# Patient Record
Sex: Female | Born: 1980 | Race: White | Hispanic: No | Marital: Married | Smoking: Former smoker
Health system: Southern US, Community
[De-identification: ages and names within clinical notes are randomized; demographics above are authoritative.]

## PROBLEM LIST (undated history)

## (undated) ENCOUNTER — Inpatient Hospital Stay (HOSPITAL_COMMUNITY): Payer: Self-pay

## (undated) DIAGNOSIS — W5911XA Bitten by nonvenomous snake, initial encounter: Secondary | ICD-10-CM

## (undated) DIAGNOSIS — F32A Depression, unspecified: Secondary | ICD-10-CM

## (undated) DIAGNOSIS — B009 Herpesviral infection, unspecified: Secondary | ICD-10-CM

## (undated) DIAGNOSIS — F329 Major depressive disorder, single episode, unspecified: Secondary | ICD-10-CM

## (undated) DIAGNOSIS — D759 Disease of blood and blood-forming organs, unspecified: Secondary | ICD-10-CM

## (undated) DIAGNOSIS — G43909 Migraine, unspecified, not intractable, without status migrainosus: Secondary | ICD-10-CM

## (undated) DIAGNOSIS — Z8701 Personal history of pneumonia (recurrent): Secondary | ICD-10-CM

## (undated) DIAGNOSIS — T8859XA Other complications of anesthesia, initial encounter: Secondary | ICD-10-CM

## (undated) DIAGNOSIS — F419 Anxiety disorder, unspecified: Secondary | ICD-10-CM

## (undated) DIAGNOSIS — Z86718 Personal history of other venous thrombosis and embolism: Secondary | ICD-10-CM

## (undated) DIAGNOSIS — Z9889 Other specified postprocedural states: Secondary | ICD-10-CM

## (undated) DIAGNOSIS — C801 Malignant (primary) neoplasm, unspecified: Secondary | ICD-10-CM

## (undated) DIAGNOSIS — R112 Nausea with vomiting, unspecified: Secondary | ICD-10-CM

## (undated) DIAGNOSIS — IMO0002 Reserved for concepts with insufficient information to code with codable children: Secondary | ICD-10-CM

## (undated) DIAGNOSIS — S0990XA Unspecified injury of head, initial encounter: Secondary | ICD-10-CM

## (undated) DIAGNOSIS — T4145XA Adverse effect of unspecified anesthetic, initial encounter: Secondary | ICD-10-CM

## (undated) HISTORY — PX: ABDOMINAL HYSTERECTOMY: SHX81

## (undated) HISTORY — DX: Anxiety disorder, unspecified: F41.9

## (undated) HISTORY — DX: Herpesviral infection, unspecified: B00.9

## (undated) HISTORY — PX: WISDOM TOOTH EXTRACTION: SHX21

## (undated) HISTORY — PX: PLACEMENT OF BREAST IMPLANTS: SHX6334

## (undated) HISTORY — DX: Reserved for concepts with insufficient information to code with codable children: IMO0002

## (undated) HISTORY — DX: Depression, unspecified: F32.A

## (undated) HISTORY — PX: ENDOMETRIAL ABLATION: SHX621

## (undated) HISTORY — DX: Major depressive disorder, single episode, unspecified: F32.9

---

## 1998-02-21 ENCOUNTER — Other Ambulatory Visit: Admission: RE | Admit: 1998-02-21 | Discharge: 1998-02-21 | Payer: Self-pay | Admitting: Obstetrics and Gynecology

## 1998-03-21 ENCOUNTER — Emergency Department (HOSPITAL_COMMUNITY): Admission: EM | Admit: 1998-03-21 | Discharge: 1998-03-21 | Payer: Self-pay | Admitting: Emergency Medicine

## 1998-07-29 ENCOUNTER — Inpatient Hospital Stay (HOSPITAL_COMMUNITY): Admission: AD | Admit: 1998-07-29 | Discharge: 1998-07-29 | Payer: Self-pay | Admitting: Obstetrics and Gynecology

## 1998-08-01 ENCOUNTER — Inpatient Hospital Stay (HOSPITAL_COMMUNITY): Admission: AD | Admit: 1998-08-01 | Discharge: 1998-08-03 | Payer: Self-pay | Admitting: Obstetrics & Gynecology

## 1998-09-24 ENCOUNTER — Inpatient Hospital Stay (HOSPITAL_COMMUNITY): Admission: AD | Admit: 1998-09-24 | Discharge: 1998-09-27 | Payer: Self-pay | Admitting: Obstetrics & Gynecology

## 1999-08-30 ENCOUNTER — Encounter: Payer: Self-pay | Admitting: Emergency Medicine

## 1999-08-30 ENCOUNTER — Emergency Department (HOSPITAL_COMMUNITY): Admission: EM | Admit: 1999-08-30 | Discharge: 1999-08-30 | Payer: Self-pay | Admitting: Emergency Medicine

## 1999-08-31 ENCOUNTER — Emergency Department (HOSPITAL_COMMUNITY): Admission: EM | Admit: 1999-08-31 | Discharge: 1999-08-31 | Payer: Self-pay | Admitting: Emergency Medicine

## 1999-09-01 ENCOUNTER — Encounter: Payer: Self-pay | Admitting: Emergency Medicine

## 1999-11-11 ENCOUNTER — Emergency Department (HOSPITAL_COMMUNITY): Admission: EM | Admit: 1999-11-11 | Discharge: 1999-11-12 | Payer: Self-pay | Admitting: Emergency Medicine

## 2000-08-18 ENCOUNTER — Emergency Department (HOSPITAL_COMMUNITY): Admission: EM | Admit: 2000-08-18 | Discharge: 2000-08-18 | Payer: Self-pay | Admitting: Emergency Medicine

## 2001-11-08 ENCOUNTER — Other Ambulatory Visit: Admission: RE | Admit: 2001-11-08 | Discharge: 2001-11-08 | Payer: Self-pay | Admitting: Obstetrics & Gynecology

## 2002-03-14 ENCOUNTER — Inpatient Hospital Stay (HOSPITAL_COMMUNITY): Admission: AD | Admit: 2002-03-14 | Discharge: 2002-03-14 | Payer: Self-pay | Admitting: Obstetrics and Gynecology

## 2002-04-02 ENCOUNTER — Inpatient Hospital Stay (HOSPITAL_COMMUNITY): Admission: AD | Admit: 2002-04-02 | Discharge: 2002-04-02 | Payer: Self-pay | Admitting: Obstetrics and Gynecology

## 2002-06-21 ENCOUNTER — Inpatient Hospital Stay (HOSPITAL_COMMUNITY): Admission: AD | Admit: 2002-06-21 | Discharge: 2002-06-21 | Payer: Self-pay | Admitting: Obstetrics and Gynecology

## 2002-06-21 ENCOUNTER — Encounter: Payer: Self-pay | Admitting: Obstetrics and Gynecology

## 2002-06-30 ENCOUNTER — Inpatient Hospital Stay (HOSPITAL_COMMUNITY): Admission: AD | Admit: 2002-06-30 | Discharge: 2002-06-30 | Payer: Self-pay | Admitting: Obstetrics and Gynecology

## 2002-06-30 ENCOUNTER — Encounter: Payer: Self-pay | Admitting: Obstetrics and Gynecology

## 2002-08-18 ENCOUNTER — Inpatient Hospital Stay (HOSPITAL_COMMUNITY): Admission: AD | Admit: 2002-08-18 | Discharge: 2002-08-18 | Payer: Self-pay | Admitting: Obstetrics & Gynecology

## 2002-09-05 ENCOUNTER — Inpatient Hospital Stay (HOSPITAL_COMMUNITY): Admission: AD | Admit: 2002-09-05 | Discharge: 2002-09-08 | Payer: Self-pay | Admitting: Obstetrics and Gynecology

## 2002-10-19 ENCOUNTER — Other Ambulatory Visit: Admission: RE | Admit: 2002-10-19 | Discharge: 2002-10-19 | Payer: Self-pay | Admitting: Obstetrics and Gynecology

## 2003-11-16 ENCOUNTER — Inpatient Hospital Stay (HOSPITAL_COMMUNITY): Admission: EM | Admit: 2003-11-16 | Discharge: 2003-11-18 | Payer: Self-pay | Admitting: Psychiatry

## 2004-07-12 ENCOUNTER — Other Ambulatory Visit: Admission: RE | Admit: 2004-07-12 | Discharge: 2004-07-12 | Payer: Self-pay | Admitting: Obstetrics and Gynecology

## 2005-02-06 ENCOUNTER — Other Ambulatory Visit: Admission: RE | Admit: 2005-02-06 | Discharge: 2005-02-06 | Payer: Self-pay | Admitting: Obstetrics and Gynecology

## 2005-02-15 ENCOUNTER — Observation Stay (HOSPITAL_COMMUNITY): Admission: AD | Admit: 2005-02-15 | Discharge: 2005-02-16 | Payer: Self-pay | Admitting: Obstetrics and Gynecology

## 2005-03-05 ENCOUNTER — Inpatient Hospital Stay (HOSPITAL_COMMUNITY): Admission: AD | Admit: 2005-03-05 | Discharge: 2005-03-05 | Payer: Self-pay | Admitting: Obstetrics and Gynecology

## 2005-07-31 ENCOUNTER — Inpatient Hospital Stay (HOSPITAL_COMMUNITY): Admission: AD | Admit: 2005-07-31 | Discharge: 2005-07-31 | Payer: Self-pay | Admitting: Obstetrics and Gynecology

## 2005-08-11 ENCOUNTER — Observation Stay (HOSPITAL_COMMUNITY): Admission: AD | Admit: 2005-08-11 | Discharge: 2005-08-11 | Payer: Self-pay | Admitting: Obstetrics and Gynecology

## 2005-08-13 ENCOUNTER — Inpatient Hospital Stay (HOSPITAL_COMMUNITY): Admission: AD | Admit: 2005-08-13 | Discharge: 2005-08-17 | Payer: Self-pay | Admitting: Obstetrics and Gynecology

## 2005-09-24 ENCOUNTER — Other Ambulatory Visit: Admission: RE | Admit: 2005-09-24 | Discharge: 2005-09-24 | Payer: Self-pay | Admitting: Obstetrics and Gynecology

## 2007-07-14 ENCOUNTER — Ambulatory Visit: Payer: Self-pay | Admitting: Hematology and Oncology

## 2007-08-06 LAB — CBC WITH DIFFERENTIAL/PLATELET
BASO%: 1.2 % (ref 0.0–2.0)
HCT: 45 % (ref 34.8–46.6)
HGB: 15.5 g/dL (ref 11.6–15.9)
MCV: 84.6 fL (ref 81.0–101.0)
NEUT%: 72 % (ref 39.6–76.8)
Platelets: 221 10*3/uL (ref 145–400)
RDW: 10 % — ABNORMAL LOW (ref 11.3–14.5)
WBC: 9.4 10*3/uL (ref 3.9–10.0)

## 2007-08-10 LAB — PROTEIN ELECTROPHORESIS, SERUM
Beta Globulin: 6.4 % (ref 4.7–7.2)
Total Protein, Serum Electrophoresis: 8.3 g/dL (ref 6.0–8.3)

## 2007-08-10 LAB — ANA: Anti Nuclear Antibody(ANA): NEGATIVE

## 2007-08-10 LAB — COMPREHENSIVE METABOLIC PANEL
Albumin: 5.2 g/dL (ref 3.5–5.2)
CO2: 24 mEq/L (ref 19–32)
Calcium: 10.2 mg/dL (ref 8.4–10.5)
Chloride: 102 mEq/L (ref 96–112)
Potassium: 4.2 mEq/L (ref 3.5–5.3)

## 2007-08-10 LAB — MTHFR DNA ANALYSIS

## 2007-08-12 LAB — HYPERCOAGULABLE PANEL, COMPREHENSIVE RET.
Anticardiolipin IgG: <7 [GPL'U] (ref <11)
Beta-2-Glycoprotein I IgM: <4 U/mL (ref <10)
DRVVT: 40.9 secs (ref 36.1–47.0)
Homocysteine: 11.4 umol/L (ref 4.0–15.4)
PTT Lupus Anticoagulant: 37.9 secs (ref 36.3–48.8)
Protein S Activity: 89 % (ref 69–129)

## 2008-07-28 HISTORY — PX: WRIST FRACTURE SURGERY: SHX121

## 2008-08-01 ENCOUNTER — Encounter: Admission: RE | Admit: 2008-08-01 | Discharge: 2008-09-19 | Payer: Self-pay | Admitting: Orthopedic Surgery

## 2008-08-25 ENCOUNTER — Emergency Department (HOSPITAL_BASED_OUTPATIENT_CLINIC_OR_DEPARTMENT_OTHER): Admission: EM | Admit: 2008-08-25 | Discharge: 2008-08-25 | Payer: Self-pay | Admitting: *Deleted

## 2008-10-15 ENCOUNTER — Emergency Department (HOSPITAL_COMMUNITY): Admission: EM | Admit: 2008-10-15 | Discharge: 2008-10-15 | Payer: Self-pay | Admitting: Emergency Medicine

## 2009-11-18 ENCOUNTER — Emergency Department (HOSPITAL_BASED_OUTPATIENT_CLINIC_OR_DEPARTMENT_OTHER): Admission: EM | Admit: 2009-11-18 | Discharge: 2009-11-18 | Payer: Self-pay | Admitting: Emergency Medicine

## 2009-11-19 ENCOUNTER — Emergency Department (HOSPITAL_BASED_OUTPATIENT_CLINIC_OR_DEPARTMENT_OTHER): Admission: EM | Admit: 2009-11-19 | Discharge: 2009-11-19 | Payer: Self-pay | Admitting: Emergency Medicine

## 2009-11-20 ENCOUNTER — Emergency Department (HOSPITAL_BASED_OUTPATIENT_CLINIC_OR_DEPARTMENT_OTHER): Admission: EM | Admit: 2009-11-20 | Discharge: 2009-11-20 | Payer: Self-pay | Admitting: Emergency Medicine

## 2010-08-18 ENCOUNTER — Encounter: Payer: Self-pay | Admitting: Obstetrics and Gynecology

## 2010-10-12 ENCOUNTER — Emergency Department (HOSPITAL_COMMUNITY)
Admission: EM | Admit: 2010-10-12 | Discharge: 2010-10-12 | Disposition: A | Discharge status: Home / Self Care | Payer: BC Managed Care – PPO | Attending: Emergency Medicine | Admitting: Emergency Medicine

## 2010-10-12 DIAGNOSIS — Z79899 Other long term (current) drug therapy: Secondary | ICD-10-CM | POA: Insufficient documentation

## 2010-10-12 DIAGNOSIS — R110 Nausea: Secondary | ICD-10-CM | POA: Insufficient documentation

## 2010-10-12 DIAGNOSIS — R51 Headache: Secondary | ICD-10-CM | POA: Insufficient documentation

## 2010-10-12 DIAGNOSIS — F3289 Other specified depressive episodes: Secondary | ICD-10-CM | POA: Insufficient documentation

## 2010-10-12 DIAGNOSIS — H53149 Visual discomfort, unspecified: Secondary | ICD-10-CM | POA: Insufficient documentation

## 2010-10-12 DIAGNOSIS — F329 Major depressive disorder, single episode, unspecified: Secondary | ICD-10-CM | POA: Insufficient documentation

## 2010-11-07 LAB — URINE MICROSCOPIC-ADD ON

## 2010-11-07 LAB — WET PREP, GENITAL
Clue Cells Wet Prep HPF POC: NONE SEEN
Trich, Wet Prep: NONE SEEN

## 2010-11-07 LAB — URINALYSIS, ROUTINE W REFLEX MICROSCOPIC
Bilirubin Urine: NEGATIVE
Protein, ur: NEGATIVE mg/dL
Specific Gravity, Urine: 1.03 (ref 1.005–1.030)
Urobilinogen, UA: 0.2 mg/dL (ref 0.0–1.0)

## 2010-11-07 LAB — URINE CULTURE

## 2010-11-11 LAB — URINALYSIS, ROUTINE W REFLEX MICROSCOPIC
Glucose, UA: NEGATIVE mg/dL
Hgb urine dipstick: NEGATIVE
Protein, ur: 100 mg/dL — AB

## 2010-11-11 LAB — BASIC METABOLIC PANEL
BUN: 17 mg/dL (ref 6–23)
CO2: 26 mEq/L (ref 19–32)
Calcium: 8.8 mg/dL (ref 8.4–10.5)
Chloride: 106 mEq/L (ref 96–112)
Creatinine, Ser: 0.9 mg/dL (ref 0.4–1.2)
GFR calc Af Amer: >60 mL/min (ref >60)
GFR calc non Af Amer: >60 mL/min (ref >60)
Glucose, Bld: 95 mg/dL (ref 70–99)
Potassium: 4 mEq/L (ref 3.5–5.1)
Sodium: 140 mEq/L (ref 135–145)

## 2010-11-11 LAB — URINE CULTURE: Culture: NO GROWTH

## 2010-11-11 LAB — URINE MICROSCOPIC-ADD ON

## 2010-12-13 NOTE — Op Note (Signed)
NAME:  Alexis Lindsey, Alexis Lindsey            ACCOUNT NO.:  [id number]   MEDICAL RECORD NO.:  [id number]          PATIENT TYPE:  INP   LOCATION:  9105                          FACILITY:  [facility]   PHYSICIAN:  Michelle L. Grewal, M.D.DATE OF BIRTH:  12-11-1980   DATE OF PROCEDURE:  08/14/2005  DATE OF DISCHARGE:                                 OPERATIVE REPORT   PREOPERATIVE DIAGNOSIS:  Intrauterine pregnancy at term with failure to  progress.   POSTOPERATIVE DIAGNOSES:  1.  Intrauterine pregnancy at term with failure to progress.  2.  Occiput posterior position.   PROCEDURE:  Primary low transverse cesarean section.   SURGEON:  Michelle L. Vincente Poli, M.D.   ANESTHESIA:  Epidural.   SPECIMENS:  Female infant in cephalic presentation, OP position, Apgars 9 at  one minute and 10 at five minutes, weighing 6 pounds 9 ounces.   ESTIMATED BLOOD LOSS:  500 mL.   COMPLICATIONS:  None.   PROCEDURE:  The patient was taken to the operating room.  Her epidural was  dosed and found to be adequate.  She was prepped and draped in the usual  sterile fashion.  A Foley catheter was already draining clear urine.  A  sterile drape was applied and a low transverse incision was made, carried  down to the fascia, the fascia scored in the midline and extended laterally.  The rectus muscles were separated in the midline.  The peritoneum was  entered bluntly.  The peritoneal incision was then stretched.  The bladder  blade was then inserted, the lower uterine segment was identified, the  bladder flap was created sharply and then digitally.  The bladder blade was  readjusted.  A low transverse incision was made in the uterus.  The uterus  was entered using a hemostat.  The baby was in OP position and was delivered  quite easily.  He was a female infant with Apgar of 9 at one minute and 10 at  five minutes and weighed 6 pounds 9 ounces.  The cord was clamped and cut.  The baby was handed to the waiting neonatologist.   Ancef and Pitocin were  then given after the placenta was manually removed and noted to be normal  and intact with a three-vessel cord.  The uterus was cleared of all clots  and debris.  The adnexa were normal.  The uterine incision was closed in one  layer using 0 chromic in a continuous running locked stitch.  Hemostasis was  noted.  Irrigation was performed.  Hemostasis was again noted.  The  peritoneum was closed using 0 Vicryl in a continuous running stitch and  rectus muscles were reapproximated using a the same 0 Vicryl.  The fascia  was closed using 0 Vicryl in a  continuous running stitch starting at each corner and meeting in the  midline.  After irrigation of subcutaneous layers, the skin was closed with  staples.  All sponge, lap and instrument counts were correct x2.  The  patient went to recovery room in stable condition.      Michelle L. Vincente Poli, M.D.  Electronically Signed  MLG/MEDQ  D:  08/14/2005  T:  08/14/2005  Job:  [id number]

## 2010-12-13 NOTE — Discharge Summary (Signed)
NAME:  Alexis Lindsey, Alexis Lindsey NO.:  [id number]   MEDICAL RECORD NO.:  [id number]          PATIENT TYPE:  INP   LOCATION:  9105                          FACILITY:  [facility]   PHYSICIAN:  Guy Sandifer. Henderson Cloud, M.D. DATE OF BIRTH:  05/22/1981   DATE OF ADMISSION:  08/13/2005  DATE OF DISCHARGE:  08/17/2005                                 DISCHARGE SUMMARY   ADMITTING DIAGNOSES:  1.  Intrauterine pregnancy at term.  2.  Spontaneous onset of labor.   DISCHARGE DIAGNOSES:  1.  Status post low transverse cesarean section secondary to failure to      progress.  2.  Viable female infant.   PROCEDURE:  Primary low transverse cesarean section.   REASON FOR ADMISSION:  Please see written H&P.   HOSPITAL COURSE:  The patient was a 30 year old gravida 3, para 2 that  presented to [facility] in early labor. The patient was  known to have positive group B beta strep and IV antibiotics were  administered per protocol. On admission, vital signs were stable, fetal  heart tones were reactive, contractions were approximately every 2 minutes.  Cervical exam revealed cervix dilated to 4-5 cm, 80% effaced, vertex at a -2  station. Artificial rupture of membranes was performed revealing clear  fluid. The patient labored throughout the day. However, she made no further  change in the cervix and approximately at 3:15 a.m. the following morning,  the baby was now experiencing some repetitive variable decelerations. Due to  failure to progress and nonreassuring fetal heart tones,  decision was made  to proceed with a primary low transverse cesarean section. The patient was  then transferred to the operating room where epidural was dosed to an  adequate surgical level. A low transverse incision was made with the  delivery of a viable female infant weighing 6 pounds 9 ounces with Apgars of 9  at one minute and 10 at five minutes. The patient tolerated the procedure  well and was taken to  the recovery room in stable condition. On  postoperative day #1, the patient was without complaint. Vital signs were  stable, she was afebrile. Abdomen was soft with good return of bowel  function. Fundus was firm and nontender. Abdominal dressing was noted to  have a small drainage noted on bandage. Laboratory findings revealed  hemoglobin of 10.2; platelet count 161,000; wbc count of 11.8. Postoperative  day #2, the patient was without complaint. Vital signs remained stable. She  was ambulating well, tolerating a regular diet without complaints of nausea  and vomiting. Abdominal dressing had been removed revealing an incision that  was clean, dry and intact. On postoperative day #3, the patient was without  complaint. Vital signs remained stable. Fundus was firm and nontender.  Incision was clean, dry and intact. Staples were removed and the patient was  discharged home.   CONDITION ON DISCHARGE:  Good.   DIET:  Regular as tolerated.   ACTIVITY:  No heavy lifting, no driving x2 weeks, no vaginal entry.   FOLLOW UP:  The patient is to follow up in the office in  1-2 weeks for an  incision check. She is to call for temperature greater than 100 degrees,  persistent nausea and vomiting, heavy vaginal bleeding, and/or redness or  drainage from the incisional site.   DISCHARGE MEDICATIONS:  1.  Percocet 5/325 #30 one p.o. every 4-6 hours p.r.n.  2.  Motrin 600 mg every 6 hours.  3.  Prenatal vitamins one p.o. daily.  4.  Colace one p.o. daily p.r.n.      Julio Sicks, N.P.      Guy Sandifer. Henderson Cloud, M.D.  Electronically Signed    CC/MEDQ  D:  09/12/2005  T:  09/12/2005  Job:  [id number]

## 2010-12-13 NOTE — H&P (Signed)
NAME:  Alexis Lindsey, Alexis Lindsey NO.:  [id number]   MEDICAL RECORD NO.:  [id number]          PATIENT TYPE:  INP   LOCATION:                                FACILITY:  [facility]   PHYSICIAN:  Duke Salvia. Marcelle Overlie, M.D.DATE OF BIRTH:  [birth year]   DATE OF ADMISSION:  02/14/2005  DATE OF DISCHARGE:  02/16/2005                                HISTORY & PHYSICAL   CHIEF COMPLAINT:  Pelvic pain, urinary tract infection.   HISTORY OF PRESENT ILLNESS:  A 30 year old G3, P2, at 11-1/2 weeks was seen  in the office yesterday with a urinalysis suspicious for urinary tract  infection, complaining of suprapubic discomfort. Ultrasound in the office  was normal. Culture was sent and she was started on Macrobid orally. When  she went home last night she began experiencing increased pelvic pain,  fever, chills, and worsening pain and nausea. When she presented to [facility] she  had a temperature of 101 and was admitted at that time for intravenous  antibiotics.   ALLERGIES:  CODEINE.   CURRENT MEDICATIONS:  Baby aspirin, Macrobid and prenatal vitamins.   PAST MEDICAL HISTORY/FAMILY HISTORY:  Please see her [place] information.   PHYSICAL EXAMINATION:  VITAL SIGNS:  Temperature 101, blood pressure 120/70.  HEENT:  Unremarkable.  NECK:  Supple without mass.  LUNGS:  Clear.  CARDIOVASCULAR:  Regular rate and rhythm without murmurs, rubs, or gallops.  BREASTS:  Not examined.  ABDOMEN:  Soft, flat, nontender. Fetal heart rate by ultrasound in the  office yesterday was normal. Follow up ultrasound will be done. There was no  CVA tenderness noted.  PELVIC EXAM:  Deferred at this time.   IMPRESSION:  1.  An 11-1/2 week intrauterine pregnancy.  2.  Urinary tract infection, possible early pyelonephritis.   PLAN:  Admit for IV antibiotics and pain control.       RMH/MEDQ  D:  02/15/2005  T:  02/15/2005  Job:  [id number]

## 2011-10-03 ENCOUNTER — Emergency Department (HOSPITAL_COMMUNITY): Payer: Self-pay

## 2011-10-03 ENCOUNTER — Other Ambulatory Visit: Payer: Self-pay

## 2011-10-03 ENCOUNTER — Encounter (HOSPITAL_COMMUNITY): Payer: Self-pay | Admitting: *Deleted

## 2011-10-03 ENCOUNTER — Emergency Department (HOSPITAL_COMMUNITY)
Admission: EM | Admit: 2011-10-03 | Discharge: 2011-10-04 | Disposition: A | Discharge status: Home / Self Care | Payer: Self-pay | Attending: Emergency Medicine | Admitting: Emergency Medicine

## 2011-10-03 DIAGNOSIS — IMO0002 Reserved for concepts with insufficient information to code with codable children: Secondary | ICD-10-CM | POA: Insufficient documentation

## 2011-10-03 DIAGNOSIS — F17200 Nicotine dependence, unspecified, uncomplicated: Secondary | ICD-10-CM | POA: Insufficient documentation

## 2011-10-03 DIAGNOSIS — S20219A Contusion of unspecified front wall of thorax, initial encounter: Secondary | ICD-10-CM | POA: Insufficient documentation

## 2011-10-03 MED ORDER — HYDROCODONE-ACETAMINOPHEN 5-500 MG PO TABS: 1.0000 | ORAL_TABLET | Freq: Four times a day (QID) | ORAL | Status: AC | PRN

## 2011-10-03 MED ORDER — NAPROXEN 500 MG PO TABS: 500.0000 mg | ORAL_TABLET | Freq: Two times a day (BID) | ORAL | Status: DC

## 2011-10-03 MED ORDER — KETOROLAC TROMETHAMINE 60 MG/2ML IM SOLN
60.0000 mg | Freq: Once | INTRAMUSCULAR | Status: AC
  Administered 2011-10-04: 60 mg via INTRAMUSCULAR
  Filled 2011-10-03: qty 2

## 2011-10-03 NOTE — ED Notes (Signed)
Family in [place] 
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
## 2011-10-04 NOTE — Discharge Instructions (Signed)
Your chest x-ray is normal, it does not show broken ribs or injury to your lungs. He may use ice packs wrapped in a towel to help with pain, take Naprosyn twice a day, hydrocodone for severe pain. Followup with your doctor as needed.

## 2011-10-04 NOTE — ED Notes (Signed)
Patient is AOx4 and comfortable with her discharge instructions. 

## 2012-05-31 ENCOUNTER — Emergency Department (HOSPITAL_COMMUNITY)
Admission: EM | Admit: 2012-05-31 | Discharge: 2012-05-31 | Disposition: A | Discharge status: Home / Self Care | Payer: Medicaid Other | Attending: Emergency Medicine | Admitting: Emergency Medicine

## 2012-05-31 ENCOUNTER — Encounter (HOSPITAL_COMMUNITY): Payer: Self-pay

## 2012-05-31 DIAGNOSIS — Z9119 Patient's noncompliance with other medical treatment and regimen: Secondary | ICD-10-CM | POA: Insufficient documentation

## 2012-05-31 DIAGNOSIS — Z91199 Patient's noncompliance with other medical treatment and regimen due to unspecified reason: Secondary | ICD-10-CM | POA: Insufficient documentation

## 2012-05-31 DIAGNOSIS — F17200 Nicotine dependence, unspecified, uncomplicated: Secondary | ICD-10-CM | POA: Insufficient documentation

## 2012-05-31 DIAGNOSIS — Z87828 Personal history of other (healed) physical injury and trauma: Secondary | ICD-10-CM | POA: Insufficient documentation

## 2012-05-31 DIAGNOSIS — G43909 Migraine, unspecified, not intractable, without status migrainosus: Secondary | ICD-10-CM | POA: Insufficient documentation

## 2012-05-31 DIAGNOSIS — Z3202 Encounter for pregnancy test, result negative: Secondary | ICD-10-CM | POA: Insufficient documentation

## 2012-05-31 HISTORY — DX: Migraine, unspecified, not intractable, without status migrainosus: G43.909

## 2012-05-31 HISTORY — DX: Unspecified injury of head, initial encounter: S09.90XA

## 2012-05-31 LAB — CBC WITH DIFFERENTIAL/PLATELET
Basophils Absolute: 0 10*3/uL (ref 0.0–0.1)
Basophils Relative: 0 % (ref 0–1)
Eosinophils Absolute: 0.1 10*3/uL (ref 0.0–0.7)
Eosinophils Relative: 1 % (ref 0–5)
MCH: 29.4 pg (ref 26.0–34.0)
MCHC: 34.1 g/dL (ref 30.0–36.0)
MCV: 86.1 fL (ref 78.0–100.0)
Monocytes Absolute: 0.7 10*3/uL (ref 0.1–1.0)
Platelets: 186 10*3/uL (ref 150–400)
RDW: 12.1 % (ref 11.5–15.5)
WBC: 11.3 10*3/uL — ABNORMAL HIGH (ref 4.0–10.5)

## 2012-05-31 LAB — BASIC METABOLIC PANEL
Calcium: 8.9 mg/dL (ref 8.4–10.5)
Creatinine, Ser: 0.88 mg/dL (ref 0.50–1.10)
GFR calc non Af Amer: 86 mL/min — ABNORMAL LOW (ref >90)
Sodium: 136 mEq/L (ref 135–145)

## 2012-05-31 LAB — HCG, SERUM, QUALITATIVE: Preg, Serum: NEGATIVE

## 2012-05-31 MED ORDER — DEXAMETHASONE SODIUM PHOSPHATE 10 MG/ML IJ SOLN
10.0000 mg | Freq: Once | INTRAMUSCULAR | Status: AC
  Administered 2012-05-31: 10 mg via INTRAVENOUS
  Filled 2012-05-31: qty 1

## 2012-05-31 MED ORDER — DIPHENHYDRAMINE HCL 50 MG/ML IJ SOLN
25.0000 mg | Freq: Once | INTRAMUSCULAR | Status: AC
  Administered 2012-05-31: 25 mg via INTRAVENOUS
  Filled 2012-05-31: qty 1

## 2012-05-31 MED ORDER — METOCLOPRAMIDE HCL 5 MG/ML IJ SOLN
10.0000 mg | Freq: Once | INTRAMUSCULAR | Status: AC
  Administered 2012-05-31: 10 mg via INTRAVENOUS
  Filled 2012-05-31: qty 2

## 2012-05-31 MED ORDER — KETOROLAC TROMETHAMINE 30 MG/ML IJ SOLN
30.0000 mg | Freq: Once | INTRAMUSCULAR | Status: AC
  Administered 2012-05-31: 30 mg via INTRAVENOUS
  Filled 2012-05-31: qty 1

## 2012-05-31 MED ORDER — SODIUM CHLORIDE 0.9 % IV BOLUS (SEPSIS)
1000.0000 mL | Freq: Once | INTRAVENOUS | Status: AC
  Administered 2012-05-31: 1000 mL via INTRAVENOUS

## 2012-05-31 NOTE — ED Provider Notes (Signed)
History     CSN: [id number]  Arrival date & time 05/31/12  0940   First MD Initiated Contact with Patient 05/31/12 [phone]      Chief Complaint  Patient presents with  . Migraine    (Consider location/radiation/quality/duration/timing/severity/associated sxs/prior treatment) HPI  Alexis Lindsey is a 31 y.o. female complaining of migraine exacerbation worsening over the last 24 hours. Patient describes her headache as pressure like on the left frontal and right occipital areas. Endorses photo and phonophobia. Typical of her prior exacerbations. Patient was seen by Dr. Sandria Manly and put on Topamax preventatively she is noncompliant with that because she says it makes her irritable. Patient denies any change the location or character of her headaches but she does state that it is slightly more severe than normal. She has been taking naproxen and a Goody headache powder without relief. Denies nausea, change in vision, focal weakness, exacerbation with Valsalva, lateralizing weakness.    Past Medical History  Diagnosis Date  . Migraines   . Head injury     History reviewed. No pertinent past surgical history.  History reviewed. No pertinent family history.  History  Substance Use Topics  . Smoking status: Current Every Day Smoker  . Smokeless tobacco: Not on file  . Alcohol Use: No    OB History    Grav Para Term Preterm Abortions TAB SAB Ect Mult Living                  Review of Systems  Constitutional: Negative for fever.  Respiratory: Negative for shortness of breath.   Cardiovascular: Negative for chest pain.  Gastrointestinal: Negative for nausea, vomiting, abdominal pain and diarrhea.  Neurological: Positive for headaches.  All other systems reviewed and are negative.    Allergies  Codeine  Home Medications   Current Outpatient Rx  Name  Route  Sig  Dispense  Refill  . GOODY HEADACHE PO   Oral   Take 1 Package by mouth 5 (five) times daily as needed. For  headache         . ALEVE PO   Oral   Take 2 tablets by mouth 4 (four) times daily as needed. For headache           BP 108/73  Pulse 90  Temp 98.3 F (36.8 C) (Oral)  Resp 20  SpO2 99%  Physical Exam  Nursing note and vitals reviewed. Constitutional: She is oriented to person, place, and time. She appears well-developed and well-nourished. No distress.  HENT:  Head: Normocephalic.  Eyes: Conjunctivae normal and EOM are normal. Pupils are equal, round, and reactive to light.  Neck: Normal range of motion. Neck supple.  Cardiovascular: Normal rate.   Pulmonary/Chest: Effort normal. No stridor.  Musculoskeletal: Normal range of motion.  Neurological: She is alert and oriented to person, place, and time.       Strength 5 out of 5x4 extremities, No facial asymmetry or dysarthria.   Psychiatric: She has a normal mood and affect.    ED Course  Procedures (including critical care time)  Labs Reviewed  CBC WITH DIFFERENTIAL - Abnormal; Notable for the following:    WBC 11.3 (*)     Neutrophils Relative 82 (*)     Neutro Abs 9.2 (*)     Lymphocytes Relative 11 (*)     All other components within normal limits  BASIC METABOLIC PANEL - Abnormal; Notable for the following:    Glucose, Bld 109 (*)  GFR calc non Af Amer 86 (*)     All other components within normal limits  HCG, SERUM, QUALITATIVE   No results found.   No diagnosis found.    MDM  Patient with migraine exacerbation. She will be given headache cocktail of Reglan, Benadryl and Decadron. She will also be bolused with her fluid.  Patient moved from pod A. to CDU based Leaking pipes in Pt Room. Signout given to CDU PA [place]/         Wynetta Emery, PA-C 05/31/12 1152

## 2012-05-31 NOTE — ED Notes (Signed)
Pt with c/o headache since yesterday morning, hx of same, "this one worse", goodies powder and aleve taken with no releif

## 2012-05-31 NOTE — ED Provider Notes (Signed)
12:08 PM Pt is in CDU holding for treatment of migraine headache.  Sign out received from [facility], PA-C.  Pt with hx migraines, presenting with her typical symptoms.  Pt reports her pain has decreased from an 8/10 to 4/10, requests more medications for "pressure."  I have added toradol.  Will recheck.    1:49 PM Pt reports she is starting to feel better, will be able to rest at home.  States she is hungry, has not been eating well.  Pt still mildly tachycardic.  Will give additional IVF and will give food.  Anticipate discharge home.    3:14 PM Pt continues to rest well.  HR now 89.  Has been given IVF and lunch.  Will d/c home.  Pt verbalizes understanding and agrees with plan.  Advised to follow up with her PCP.  Pt given return precautions.   Results for orders placed during the hospital encounter of 05/31/12  CBC WITH DIFFERENTIAL      Component Value Range   WBC 11.3 (*) 4.0 - 10.5 K/uL   RBC 4.83  3.87 - 5.11 MIL/uL   Hemoglobin 14.2  12.0 - 15.0 g/dL   HCT 25.9  56.3 - 87.5 %   MCV 86.1  78.0 - 100.0 fL   MCH 29.4  26.0 - 34.0 pg   MCHC 34.1  30.0 - 36.0 g/dL   RDW 64.3  32.9 - 51.8 %   Platelets 186  150 - 400 K/uL   Neutrophils Relative 82 (*) 43 - 77 %   Neutro Abs 9.2 (*) 1.7 - 7.7 K/uL   Lymphocytes Relative 11 (*) 12 - 46 %   Lymphs Abs 1.3  0.7 - 4.0 K/uL   Monocytes Relative 6  3 - 12 %   Monocytes Absolute 0.7  0.1 - 1.0 K/uL   Eosinophils Relative 1  0 - 5 %   Eosinophils Absolute 0.1  0.0 - 0.7 K/uL   Basophils Relative 0  0 - 1 %   Basophils Absolute 0.0  0.0 - 0.1 K/uL  BASIC METABOLIC PANEL      Component Value Range   Sodium 136  135 - 145 mEq/L   Potassium 3.8  3.5 - 5.1 mEq/L   Chloride 101  96 - 112 mEq/L   CO2 26  19 - 32 mEq/L   Glucose, Bld 109 (*) 70 - 99 mg/dL   BUN 9  6 - 23 mg/dL   Creatinine, Ser 8.41  0.50 - 1.10 mg/dL   Calcium 8.9  8.4 - 66.0 mg/dL   GFR calc non Af Amer 86 (*) >90 mL/min   GFR calc Af Amer >90  >90 mL/min  HCG,  SERUM, QUALITATIVE      Component Value Range   Preg, Serum NEGATIVE  NEGATIVE   No results found.    [place], [place] 05/31/12 1551

## 2012-06-01 NOTE — ED Provider Notes (Signed)
Medical screening examination/treatment/procedure(s) were performed by non-physician practitioner and as supervising physician I was immediately available for consultation/collaboration.  Tobin Chad, MD 06/01/12 [phone]

## 2012-06-01 NOTE — ED Provider Notes (Signed)
Medical screening examination/treatment/procedure(s) were performed by non-physician practitioner and as supervising physician I was immediately available for consultation/collaboration.  Tobin Chad, MD 06/01/12 [phone]

## 2012-06-03 ENCOUNTER — Emergency Department (HOSPITAL_COMMUNITY)
Admission: EM | Admit: 2012-06-03 | Discharge: 2012-06-03 | Disposition: A | Discharge status: Home / Self Care | Payer: Medicaid Other | Attending: Emergency Medicine | Admitting: Emergency Medicine

## 2012-06-03 ENCOUNTER — Encounter (HOSPITAL_COMMUNITY): Payer: Self-pay | Admitting: *Deleted

## 2012-06-03 DIAGNOSIS — R112 Nausea with vomiting, unspecified: Secondary | ICD-10-CM | POA: Insufficient documentation

## 2012-06-03 DIAGNOSIS — K529 Noninfective gastroenteritis and colitis, unspecified: Secondary | ICD-10-CM

## 2012-06-03 DIAGNOSIS — Z79899 Other long term (current) drug therapy: Secondary | ICD-10-CM | POA: Insufficient documentation

## 2012-06-03 DIAGNOSIS — G43909 Migraine, unspecified, not intractable, without status migrainosus: Secondary | ICD-10-CM | POA: Insufficient documentation

## 2012-06-03 DIAGNOSIS — Z87828 Personal history of other (healed) physical injury and trauma: Secondary | ICD-10-CM | POA: Insufficient documentation

## 2012-06-03 DIAGNOSIS — N390 Urinary tract infection, site not specified: Secondary | ICD-10-CM

## 2012-06-03 DIAGNOSIS — K5289 Other specified noninfective gastroenteritis and colitis: Secondary | ICD-10-CM | POA: Insufficient documentation

## 2012-06-03 DIAGNOSIS — F17200 Nicotine dependence, unspecified, uncomplicated: Secondary | ICD-10-CM | POA: Insufficient documentation

## 2012-06-03 LAB — COMPREHENSIVE METABOLIC PANEL
ALT: 27 U/L (ref 0–35)
AST: 17 U/L (ref 0–37)
Albumin: 3.1 g/dL — ABNORMAL LOW (ref 3.5–5.2)
Alkaline Phosphatase: 50 U/L (ref 39–117)
BUN: 21 mg/dL (ref 6–23)
CO2: 22 mEq/L (ref 19–32)
Calcium: 7.9 mg/dL — ABNORMAL LOW (ref 8.4–10.5)
Chloride: 103 mEq/L (ref 96–112)
Creatinine, Ser: 0.75 mg/dL (ref 0.50–1.10)
GFR calc Af Amer: >90 mL/min (ref >90)
GFR calc non Af Amer: >90 mL/min (ref >90)
Glucose, Bld: 90 mg/dL (ref 70–99)
Potassium: 3.7 mEq/L (ref 3.5–5.1)
Sodium: 134 mEq/L — ABNORMAL LOW (ref 135–145)
Total Bilirubin: 0.3 mg/dL (ref 0.3–1.2)
Total Protein: 5.7 g/dL — ABNORMAL LOW (ref 6.0–8.3)

## 2012-06-03 LAB — CBC WITH DIFFERENTIAL/PLATELET
Basophils Absolute: 0 10*3/uL (ref 0.0–0.1)
Basophils Relative: 0 % (ref 0–1)
Eosinophils Absolute: 0.1 10*3/uL (ref 0.0–0.7)
Eosinophils Relative: 1 % (ref 0–5)
HCT: 39 % (ref 36.0–46.0)
Hemoglobin: 13.5 g/dL (ref 12.0–15.0)
Lymphocytes Relative: 5 % — ABNORMAL LOW (ref 12–46)
Lymphs Abs: 0.5 10*3/uL — ABNORMAL LOW (ref 0.7–4.0)
MCH: 29.5 pg (ref 26.0–34.0)
MCHC: 34.6 g/dL (ref 30.0–36.0)
MCV: 85.2 fL (ref 78.0–100.0)
Monocytes Absolute: 0.5 10*3/uL (ref 0.1–1.0)
Monocytes Relative: 5 % (ref 3–12)
Neutro Abs: 8.5 10*3/uL — ABNORMAL HIGH (ref 1.7–7.7)
Neutrophils Relative %: 89 % — ABNORMAL HIGH (ref 43–77)
Platelets: 184 10*3/uL (ref 150–400)
RBC: 4.58 MIL/uL (ref 3.87–5.11)
RDW: 12.3 % (ref 11.5–15.5)
WBC: 9.6 10*3/uL (ref 4.0–10.5)

## 2012-06-03 LAB — URINE MICROSCOPIC-ADD ON

## 2012-06-03 LAB — URINALYSIS, ROUTINE W REFLEX MICROSCOPIC
Bilirubin Urine: NEGATIVE
Glucose, UA: NEGATIVE mg/dL
Hgb urine dipstick: NEGATIVE
Ketones, ur: NEGATIVE mg/dL
Nitrite: NEGATIVE
Protein, ur: NEGATIVE mg/dL
Specific Gravity, Urine: 1.02 (ref 1.005–1.030)
Urobilinogen, UA: 0.2 mg/dL (ref 0.0–1.0)
pH: 5.5 (ref 5.0–8.0)

## 2012-06-03 LAB — LIPASE, BLOOD: Lipase: 24 U/L (ref 11–59)

## 2012-06-03 LAB — PREGNANCY, URINE: Preg Test, Ur: NEGATIVE

## 2012-06-03 MED ORDER — PROMETHAZINE HCL 25 MG/ML IJ SOLN
12.5000 mg | Freq: Once | INTRAMUSCULAR | Status: AC
  Administered 2012-06-03: 12.5 mg via INTRAVENOUS
  Filled 2012-06-03: qty 1

## 2012-06-03 MED ORDER — DIPHENHYDRAMINE HCL 50 MG/ML IJ SOLN
12.5000 mg | Freq: Once | INTRAMUSCULAR | Status: AC
  Administered 2012-06-03: 12.5 mg via INTRAVENOUS
  Filled 2012-06-03: qty 1

## 2012-06-03 MED ORDER — SODIUM CHLORIDE 0.9 % IV BOLUS (SEPSIS)
2000.0000 mL | Freq: Once | INTRAVENOUS | Status: AC
  Administered 2012-06-03: 2000 mL via INTRAVENOUS

## 2012-06-03 MED ORDER — ONDANSETRON HCL 4 MG/2ML IJ SOLN
4.0000 mg | Freq: Once | INTRAMUSCULAR | Status: AC
  Administered 2012-06-03: 4 mg via INTRAVENOUS
  Filled 2012-06-03: qty 2

## 2012-06-03 MED ORDER — DEXAMETHASONE SODIUM PHOSPHATE 10 MG/ML IJ SOLN
10.0000 mg | Freq: Once | INTRAMUSCULAR | Status: AC
  Administered 2012-06-03: 10 mg via INTRAVENOUS
  Filled 2012-06-03: qty 1

## 2012-06-03 MED ORDER — ONDANSETRON HCL 4 MG/2ML IJ SOLN
INTRAMUSCULAR | Status: AC
  Administered 2012-06-03: 06:00:00
  Filled 2012-06-03: qty 2

## 2012-06-03 MED ORDER — KETOROLAC TROMETHAMINE 30 MG/ML IJ SOLN
30.0000 mg | Freq: Once | INTRAMUSCULAR | Status: AC
  Administered 2012-06-03: 30 mg via INTRAVENOUS
  Filled 2012-06-03: qty 1

## 2012-06-03 MED ORDER — CEPHALEXIN 500 MG PO CAPS: 500.0000 mg | ORAL_CAPSULE | Freq: Four times a day (QID) | ORAL | Status: DC

## 2012-06-03 MED ORDER — ONDANSETRON HCL 4 MG/2ML IJ SOLN: 4.0000 mg | Freq: Once | INTRAMUSCULAR | Status: DC

## 2012-06-03 MED ORDER — PROMETHAZINE HCL 25 MG PO TABS: 25.0000 mg | ORAL_TABLET | Freq: Four times a day (QID) | ORAL | Status: DC | PRN

## 2012-06-03 MED ORDER — SODIUM CHLORIDE 0.9 % IV BOLUS (SEPSIS)
1000.0000 mL | Freq: Once | INTRAVENOUS | Status: AC
  Administered 2012-06-03: 1000 mL via INTRAVENOUS

## 2012-06-03 NOTE — ED Notes (Signed)
Pt feeling much better. Was able to tolerate water and graham crackers. HA remains 2/10 pain. Thayer Ohm, EDPA at bedside and informed pt of impending discharge. Pt verbalizes understanding and agreement.

## 2012-06-03 NOTE — ED Notes (Signed)
Pt given water for PO fluid challenge. Pt also requesting a snack. Given graham crackers. Pt reports HA 2/10. Appears very sleepy.

## 2012-06-03 NOTE — ED Notes (Signed)
Care of pt assumed. Pt reports 6/10 abd pain, but new HA 10/10. Sts nausea is "good." Will inform EDP.

## 2012-06-03 NOTE — ED Notes (Signed)
Thayer Ohm, EDPA made aware of pt's pain. Sts he will be in to reassess pt as he sts abd pain is new to his original assessment.

## 2012-06-03 NOTE — ED Provider Notes (Signed)
History     CSN: [id number]  Arrival date & time 06/03/12  7829   First MD Initiated Contact with Patient 06/03/12 [id number]      Chief Complaint  Patient presents with  . Emesis  . Diarrhea    (Consider location/radiation/quality/duration/timing/severity/associated sxs/prior treatment) HPI Pt is a 31 yo female who presents to the ER with nausea, vomiting and diarrhea.  Vomiting began at 10 pm last night and continues until now.  Diarrhea began shortly after.  Pt denies hemoptysis and hematochezia.  Husband states that he had similar symptoms on Tuesday to a lesser degree.  Pt has had fever.  Pt states that she has some abdominal tenderness on the right side.  Pt is also experiencing a headache.  She states that she usually gets migraines and takes goodies powder for them.  Pt denies chest pain, SOB, dysuria, neck pain, syncope, dizziness, dysuria and rash.   Past Medical History  Diagnosis Date  . Migraines   . Head injury     History reviewed. No pertinent past surgical history.  No family history on file.  History  Substance Use Topics  . Smoking status: Current Every Day Smoker    Types: Cigarettes  . Smokeless tobacco: Not on file  . Alcohol Use: No    OB History    Grav Para Term Preterm Abortions TAB SAB Ect Mult Living                  Review of Systems All other systems negative as documented in the HPI. All pertinent positives and negatives as reviewed in the HPI.  Allergies  Codeine  Home Medications   Current Outpatient Rx  Name  Route  Sig  Dispense  Refill  . GOODY HEADACHE PO   Oral   Take 1 Package by mouth 5 (five) times daily as needed. For headache         . ALEVE PO   Oral   Take 2 tablets by mouth 4 (four) times daily as needed. For headache           BP 97/64  Pulse 116  Temp 100.4 F (38 C) (Oral)  Resp 20  Ht 5\' 9"  (1.753 m)  Wt 135 lb (61.236 kg)  BMI 19.94 kg/m2  SpO2 97%  Physical Exam  Constitutional: She is oriented  to person, place, and time. She appears well-developed and well-nourished. She appears distressed.  HENT:  Head: Normocephalic and atraumatic.  Mouth/Throat: Uvula is midline. Mucous membranes are dry. No uvula swelling. No oropharyngeal exudate, posterior oropharyngeal edema or posterior oropharyngeal erythema.  Neck: Normal range of motion.  Cardiovascular: Regular rhythm and normal heart sounds.  Tachycardia present.  Exam reveals no gallop and no friction rub.   No murmur heard. Pulmonary/Chest: Effort normal and breath sounds normal.  Abdominal: Soft. Bowel sounds are normal. She exhibits no distension. There is no tenderness. There is no rebound and no guarding.  Neurological: She is alert and oriented to person, place, and time.  Skin: Skin is warm. No rash noted. She is diaphoretic.    ED Course  Procedures (including critical care time)   Labs Reviewed  CBC WITH DIFFERENTIAL  COMPREHENSIVE METABOLIC PANEL  URINALYSIS, ROUTINE W REFLEX MICROSCOPIC  LIPASE, BLOOD   09:15AM Patient rechecked and still has a headache. Further medications given   10:45 AM Patient is sleeping and feeling some better at this time  11:52 AM Patient is feeling vastly improved and feels that she  can go home. She drank ginger ale and had some crackers and was able to tolerate this without issue. MDM  MDM Reviewed: nursing note, vitals and previous chart Interpretation: labs   The patient most likely has gastroenteritis. The patient is advised to return here as needed. Follow up with her PCP. Slowly increase her fluid intake.   Filed Vitals:   06/03/12 0605 06/03/12 0846 06/03/12 1036 06/03/12 1150  BP: 97/64 99/48 93/59  104/55  Pulse: 116 16 106 100  Temp: 100.4 F (38 C) 98.1 F (36.7 C)  98.6 F (37 C)  TempSrc: Oral Oral  Oral  Resp: 20 16  18   Height: 5\' 9"  (1.753 m)     Weight: 135 lb (61.236 kg)     SpO2: 97% 99% 97% 96%          Carlyle Dolly, PA-C 06/03/12  1156

## 2012-06-03 NOTE — ED Notes (Signed)
HQI:ON62<XB> Expected date:06/03/12<BR> Expected time: 5:11 AM<BR> Means of arrival:Ambulance<BR> Comments:<BR> N/v/d

## 2012-06-03 NOTE — ED Notes (Signed)
Per EMS, pt has been having  N/V/D, since 8 pm, one episode of n//v in the ambulance, pt is warm to touch with chills. IV lt. Wrist, 4 mg of zofran. Vital signs at 0520 was BP= 112/60, HR-110,  R-20, O2-100 on r/a

## 2012-06-04 NOTE — ED Provider Notes (Signed)
Medical screening examination/treatment/procedure(s) were performed by non-physician practitioner and as supervising physician I was immediately available for consultation/collaboration.   R. , MD 06/04/12 0653 

## 2012-06-05 LAB — URINE CULTURE: Colony Count: >=100000

## 2012-06-06 NOTE — ED Notes (Signed)
+  Urine. Patient treated with Keflex. Sensitive to same. Per protocol MD. °

## 2012-07-28 HISTORY — PX: OTHER SURGICAL HISTORY: SHX169

## 2012-12-08 ENCOUNTER — Encounter (HOSPITAL_COMMUNITY): Payer: Self-pay

## 2012-12-08 ENCOUNTER — Inpatient Hospital Stay (HOSPITAL_COMMUNITY)
Admission: AD | Admit: 2012-12-08 | Discharge: 2012-12-08 | Disposition: A | Discharge status: Home / Self Care | Payer: BC Managed Care – PPO | Source: Ambulatory Visit | Attending: Obstetrics and Gynecology | Admitting: Obstetrics and Gynecology

## 2012-12-08 ENCOUNTER — Inpatient Hospital Stay (HOSPITAL_COMMUNITY): Payer: BC Managed Care – PPO

## 2012-12-08 DIAGNOSIS — R109 Unspecified abdominal pain: Secondary | ICD-10-CM | POA: Insufficient documentation

## 2012-12-08 DIAGNOSIS — O9989 Other specified diseases and conditions complicating pregnancy, childbirth and the puerperium: Secondary | ICD-10-CM

## 2012-12-08 DIAGNOSIS — K5900 Constipation, unspecified: Secondary | ICD-10-CM | POA: Insufficient documentation

## 2012-12-08 DIAGNOSIS — O99891 Other specified diseases and conditions complicating pregnancy: Secondary | ICD-10-CM | POA: Insufficient documentation

## 2012-12-08 LAB — WET PREP, GENITAL
Clue Cells Wet Prep HPF POC: NONE SEEN
Trich, Wet Prep: NONE SEEN

## 2012-12-08 LAB — URINALYSIS, ROUTINE W REFLEX MICROSCOPIC
Bilirubin Urine: NEGATIVE
Glucose, UA: NEGATIVE mg/dL
Hgb urine dipstick: NEGATIVE
Ketones, ur: 15 mg/dL — AB
Leukocytes, UA: NEGATIVE
Protein, ur: NEGATIVE mg/dL
pH: 7 (ref 5.0–8.0)

## 2012-12-08 MED ORDER — ACETAMINOPHEN 325 MG PO TABS
650.0000 mg | ORAL_TABLET | Freq: Once | ORAL | Status: AC
  Administered 2012-12-08: 650 mg via ORAL
  Filled 2012-12-08: qty 2

## 2012-12-08 NOTE — MAU Note (Signed)
Onset of lower abdominal cramping since 0200 denies vaginal bleeding, constipation, LBM about 30 minutes ago hard.

## 2012-12-08 NOTE — MAU Provider Note (Signed)
History     CSN: [id number]  Arrival date and time: 12/08/12 0431   First Provider Initiated Contact with Patient 12/08/12 0450      Chief Complaint  Patient presents with  . Abdominal Cramping   HPI [name] is a 32 y.o. [phone] at [redacted]w[redacted]d who presents to MAU today with complaint of sudden onset lower abdominal cramping since 0200. The patient was woken from sleep with pain. She states that it "comes and goes like contractions." She denies vaginal bleeding, discharge, UTI symptoms, diarrhea, N/V or fever. She has had constipation. Last BM was around 4 am. She is having to strain to use the bathroom. She had [place] in the office last Friday because of spotting. Had spotting until Sunday, none since. She rates the pain now at a 2/10, but states that it will get to 8/10 at the worst.    OB History   Grav Para Term Preterm Abortions TAB SAB Ect Mult Living   4 3 3       3       Past Medical History  Diagnosis Date  . Migraines   . Head injury     Past Surgical History  Procedure Laterality Date  . Cesarean section      No family history on file.  History  Substance Use Topics  . Smoking status: Current Every Day Smoker    Types: Cigarettes  . Smokeless tobacco: Not on file  . Alcohol Use: No    Allergies:  Allergies  Allergen Reactions  . Codeine Nausea And Vomiting    Prescriptions prior to admission  Medication Sig Dispense Refill  . Aspirin-Acetaminophen-Caffeine (GOODYS EXTRA STRENGTH) 260-130-16 MG TABS Take 1 Package by mouth 2 (two) times daily as needed. For headache or pain      . cephALEXin (KEFLEX) 500 MG capsule Take 1 capsule (500 mg total) by mouth 4 (four) times daily.  28 capsule  0  . naproxen sodium (ANAPROX) 220 MG tablet Take 220 mg by mouth 2 (two) times daily with a meal. For pain      . promethazine (PHENERGAN) 25 MG tablet Take 1 tablet (25 mg total) by mouth every 6 (six) hours as needed for nausea.  10 tablet  0    Review of  Systems  Constitutional: Negative for fever and malaise/fatigue.  Gastrointestinal: Positive for abdominal pain and constipation. Negative for nausea, vomiting and diarrhea.  Genitourinary: Negative for dysuria, urgency and frequency.       Neg - vaginal bleeding, discharge   Physical Exam   Last menstrual period 10/19/2012.  Physical Exam  Constitutional: She is oriented to person, place, and time. She appears well-developed and well-nourished. No distress.  HENT:  Head: Normocephalic and atraumatic.  Cardiovascular: Normal rate, regular rhythm and normal heart sounds.   Respiratory: Effort normal and breath sounds normal. No respiratory distress.  GI: Soft. Bowel sounds are normal. She exhibits no distension and no mass. There is tenderness (mild tenderness to palpation of the lower abdomen). There is no rebound and no guarding.  Genitourinary: Uterus is enlarged (appropriate for GA) and tender. Cervix exhibits no motion tenderness, no discharge and no friability. Right adnexum displays tenderness. Right adnexum displays no mass. Left adnexum displays tenderness. Left adnexum displays no mass. No bleeding around the vagina. Vaginal discharge (small amount of light brown mucus discharge) found.  Neurological: She is alert and oriented to person, place, and time.  Skin: Skin is warm and dry. No erythema.  Psychiatric: She has a normal mood and affect.   Results for orders placed during the hospital encounter of 12/08/12 (from the past 24 hour(s))  URINALYSIS, ROUTINE W REFLEX MICROSCOPIC     Status: Abnormal   Collection Time    12/08/12  4:40 AM      Result Value Range   Color, Urine YELLOW  YELLOW   APPearance CLEAR  CLEAR   Specific Gravity, Urine 1.015  1.005 - 1.030   pH 7.0  5.0 - 8.0   Glucose, UA NEGATIVE  NEGATIVE mg/dL   Hgb urine dipstick NEGATIVE  NEGATIVE   Bilirubin Urine NEGATIVE  NEGATIVE   Ketones, ur 15 (*) NEGATIVE mg/dL   Protein, ur NEGATIVE  NEGATIVE mg/dL    Urobilinogen, UA 1.0  0.0 - 1.0 mg/dL   Nitrite NEGATIVE  NEGATIVE   Leukocytes, UA NEGATIVE  NEGATIVE  POCT PREGNANCY, URINE     Status: Abnormal   Collection Time    12/08/12  4:46 AM      Result Value Range   Preg Test, Ur POSITIVE (*) NEGATIVE  WET PREP, GENITAL     Status: Abnormal   Collection Time    12/08/12  5:00 AM      Result Value Range   Yeast Wet Prep HPF POC NONE SEEN  NONE SEEN   Trich, Wet Prep NONE SEEN  NONE SEEN   Clue Cells Wet Prep HPF POC NONE SEEN  NONE SEEN   WBC, Wet Prep HPF POC FEW (*) NONE SEEN   US Ob Comp Less 14 Wks  12/08/2012   *RADIOLOGY REPORT*  Clinical Data: Abdominal pain, pregnant.  OBSTETRIC <14 WK ULTRASOUND  Technique:  Transabdominal ultrasound was performed for evaluation of the gestation as well as the maternal uterus and adnexal regions.  Comparison:  None.  Intrauterine gestational sac: Visualized/normal in shape. Yolk sac: Identified Embryo: Identified Cardiac Activity: Identified Heart Rate: 128 bpm  CRL:  5.8 mm  6 w  3 d        [place] EDC: 07/31/2013  Maternal uterus/Adnexae: No subchorionic hemorrhage.  Normal sonographic appearance to the ovaries with a corpus luteal cyst noted on the right.  There is trace free fluid.  IMPRESSION: Single intrauterine gestation with cardiac activity documented. Estimated age of 6 weeks 3 days by crown-rump length.   Original Report Authenticated By: Jearld Lesch, M.D.    MAU Course  Procedures None  MDM Discussed patient with Dr. Renaldo Fiddler. [place] today Discussed [place] results. Patient to follow-up in the office as scheduled  Assessment and Plan  A: Constipation Abdominal pain in pregnancy  P: Discharge home Discussed increased PO hydration as tolerated Patient advised to follow-up in the office as scheduled Patient may return to MAU as needed or if her condition were to change or worsen  Freddi Starr, PA-C  12/08/2012, 5:11 AM

## 2012-12-16 LAB — OB RESULTS CONSOLE HIV ANTIBODY (ROUTINE TESTING): HIV: NONREACTIVE

## 2012-12-16 LAB — OB RESULTS CONSOLE RUBELLA ANTIBODY, IGM: Rubella: IMMUNE

## 2012-12-16 LAB — OB RESULTS CONSOLE ABO/RH

## 2012-12-16 LAB — OB RESULTS CONSOLE ANTIBODY SCREEN: Antibody Screen: NEGATIVE

## 2012-12-30 ENCOUNTER — Inpatient Hospital Stay (HOSPITAL_COMMUNITY)
Admission: AD | Admit: 2012-12-30 | Discharge: 2012-12-30 | Disposition: A | Discharge status: Home / Self Care | Payer: BC Managed Care – PPO | Source: Ambulatory Visit | Attending: Obstetrics & Gynecology | Admitting: Obstetrics & Gynecology

## 2012-12-30 ENCOUNTER — Encounter (HOSPITAL_COMMUNITY): Payer: Self-pay | Admitting: *Deleted

## 2012-12-30 DIAGNOSIS — I889 Nonspecific lymphadenitis, unspecified: Secondary | ICD-10-CM | POA: Insufficient documentation

## 2012-12-30 DIAGNOSIS — R339 Retention of urine, unspecified: Secondary | ICD-10-CM | POA: Insufficient documentation

## 2012-12-30 DIAGNOSIS — R109 Unspecified abdominal pain: Secondary | ICD-10-CM | POA: Insufficient documentation

## 2012-12-30 HISTORY — DX: Other specified postprocedural states: Z98.890

## 2012-12-30 HISTORY — DX: Other complications of anesthesia, initial encounter: T88.59XA

## 2012-12-30 HISTORY — DX: Nausea with vomiting, unspecified: R11.2

## 2012-12-30 HISTORY — DX: Adverse effect of unspecified anesthetic, initial encounter: T41.45XA

## 2012-12-30 MED ORDER — BENZOCAINE-MENTHOL 20-0.5 % EX AERO
1.0000 "application " | INHALATION_SPRAY | Freq: Four times a day (QID) | CUTANEOUS | Status: DC | PRN
  Filled 2012-12-30: qty 56

## 2012-12-30 MED ORDER — BENZOCAINE (TOPICAL) 20 % EX AERO
INHALATION_SPRAY | Freq: Once | CUTANEOUS | Status: DC
  Filled 2012-12-30: qty 57

## 2012-12-30 MED ORDER — OXYCODONE-ACETAMINOPHEN 5-325 MG PO TABS: 2.0000 | ORAL_TABLET | Freq: Once | ORAL | Status: DC

## 2012-12-30 MED ORDER — OXYCODONE-ACETAMINOPHEN 5-325 MG PO TABS
2.0000 | ORAL_TABLET | Freq: Once | ORAL | Status: AC
  Administered 2012-12-30: 2 via ORAL
  Filled 2012-12-30: qty 2

## 2012-12-30 MED ORDER — CEPHALEXIN 500 MG PO CAPS: 500.0000 mg | ORAL_CAPSULE | Freq: Two times a day (BID) | ORAL | Status: DC

## 2012-12-30 NOTE — MAU Note (Signed)
Patient states she has a cyst that block the meatus and causes difficulty urinating. Was noted on 5-30 in the office and instructed to use warm compresses and if it got worse to come to MAU. Patient is having pain with her full bladder and from the cyst.

## 2012-12-30 NOTE — MAU Provider Note (Signed)
History     CSN: [id number]  Arrival date and time: 12/30/12 1745   First Provider Initiated Contact with Patient 12/30/12 1824      No chief complaint on file.  HPI [name] is a 32 y.o. [phone] at [redacted]w[redacted]d who presents to MAU today with complaint of a swollen skene gland and inability to urinate. The patient states that she was in the office on Friday and Dr. Rana Snare recommended that she use warm compresses and soak in a warm tub in attempt to express discharge from the gland. The patient states that she has tried all of the and the gland has not drained at all. She feels it has gotten bigger. She has been unable to urinate since this morning. She feels increasing lower abdominal pressure in her lower abdomen. She states that the gland is painful if she has to sit up, but when laying down it is ok. She denies fever.   OB History   Grav Para Term Preterm Abortions TAB SAB Ect Mult Living   4 3 3       3       Past Medical History  Diagnosis Date  . Migraines   . Head injury   . Complication of anesthesia   . PONV (postoperative nausea and vomiting)     Past Surgical History  Procedure Laterality Date  . Cesarean section      No family history on file.  History  Substance Use Topics  . Smoking status: Former Smoker    Types: Cigarettes    Quit date: 08/01/2010  . Smokeless tobacco: Not on file  . Alcohol Use: No    Allergies:  Allergies  Allergen Reactions  . Macrobid (Nitrofurantoin Macrocrystal)     Nausea and  vomiitng   . Codeine Nausea And Vomiting    Prescriptions prior to admission  Medication Sig Dispense Refill  . promethazine (PHENERGAN) 25 MG tablet Take 1 tablet (25 mg total) by mouth every 6 (six) hours as needed for nausea.  10 tablet  0    Review of Systems  Gastrointestinal: Positive for abdominal pain.  Genitourinary:       + urinary retention   Physical Exam   Blood pressure 118/61, pulse 75, temperature 98.3 F (36.8 C),  temperature source Oral, resp. rate 18, height 5\' 9"  (1.753 m), weight 169 lb 9.6 oz (76.93 kg), last menstrual period 10/19/2012.  Physical Exam  Constitutional: She is oriented to person, place, and time. She appears well-developed and well-nourished. No distress.  HENT:  Head: Normocephalic and atraumatic.  Cardiovascular: Normal rate.   Respiratory: Effort normal.  GI: Soft. Bowel sounds are normal. She exhibits no distension and no mass. There is tenderness (mild tenderness to palpation of the lower abdomen). There is no rebound and no guarding.  Genitourinary:     Neurological: She is alert and oriented to person, place, and time.  Skin: Skin is warm and dry. No erythema.  Psychiatric: She has a normal mood and affect.    MAU Course  Procedures None  MDM Discussed patient with Dr. Langston Masker. She would like [place] to catheterize the patient for bladder emptying. Catheter placed and able to obtain ~ 275 cc of urine Patient feels some relief of abdominal pain Dr. Langston Masker has consulted urology and will come to MAU to aspirate the gland.  Assessment and Plan  A: Inflamed skene gland Urinary retention  P: Dr. Langston Masker to come to MAU to see patient.  Freddi Starr, PA-C  12/30/2012, 6:24 PM

## 2012-12-30 NOTE — Progress Notes (Signed)
Pt c/o pain and inability to void today secondary to enlarging Skene's gland cyst.  She has tried compresses and to express contents without success.  She is [redacted] weeks pregnant with an otherwise uncomplicated pregnancy.  She has no h/o this previously.  She denies fever/chills.  Her abdominal pain is improved after Foley was placed.  VSS.  AF.   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 Berneice Heinrich of urology who recommends aspiration/I&D with at least one week of abx.  Unlikely to be urethral diverticulum because it is not midline and did not decompress with catheter placement.    Procedure Note: After the patient was consented for aspiration/I&D, Dermoplast spray was applied.  Betadine swabs x 3.  Using an 18 gauge needle, ~5cc of tan colored fluid was aspirated without difficulty.  The foley catheter was removed.    31yo [phone] at [redacted]w[redacted]d with right Skene's gland cyst -Continued expression at home and sitz baths encouraged -Percocet 5mg  #20 given for pain control -Keflex x 10d rx given -F/U in office for recheck early next week

## 2012-12-30 NOTE — MAU Note (Signed)
Pt states she has a gland blocking her urethra  Making it difficult to pass urine

## 2012-12-30 NOTE — Progress Notes (Signed)
Pt states she has urgency and feel the need to  Use the bathroom, but is not able to

## 2013-01-01 ENCOUNTER — Encounter (HOSPITAL_COMMUNITY): Payer: Self-pay

## 2013-01-01 ENCOUNTER — Inpatient Hospital Stay (HOSPITAL_COMMUNITY)
Admission: AD | Admit: 2013-01-01 | Discharge: 2013-01-01 | Disposition: A | Discharge status: Home / Self Care | Payer: BC Managed Care – PPO | Source: Ambulatory Visit | Attending: Obstetrics and Gynecology | Admitting: Obstetrics and Gynecology

## 2013-01-01 DIAGNOSIS — R109 Unspecified abdominal pain: Secondary | ICD-10-CM | POA: Insufficient documentation

## 2013-01-01 DIAGNOSIS — Z3401 Encounter for supervision of normal first pregnancy, first trimester: Secondary | ICD-10-CM

## 2013-01-01 DIAGNOSIS — R300 Dysuria: Secondary | ICD-10-CM | POA: Insufficient documentation

## 2013-01-01 DIAGNOSIS — R339 Retention of urine, unspecified: Secondary | ICD-10-CM

## 2013-01-01 DIAGNOSIS — O99891 Other specified diseases and conditions complicating pregnancy: Secondary | ICD-10-CM | POA: Insufficient documentation

## 2013-01-01 LAB — URINALYSIS, ROUTINE W REFLEX MICROSCOPIC
Nitrite: NEGATIVE
Protein, ur: NEGATIVE mg/dL
Specific Gravity, Urine: >1.03 — ABNORMAL HIGH (ref 1.005–1.030)
Urobilinogen, UA: 0.2 mg/dL (ref 0.0–1.0)

## 2013-01-01 NOTE — MAU Provider Note (Signed)
History     CSN: [id number]  Arrival date and time: 01/01/13 1916   First Provider Initiated Contact with Patient 01/01/13 1940      Chief Complaint  Patient presents with  . Dysuria   HPI Alexis Lindsey is 32 y.o. [phone] [redacted]w[redacted]d weeks presenting with lower abdominal pain.  She also reports inability to urinate, "dribbles".  Denies fever and chills.  Sxs began 3 days ago.  She was here on Thursday with a Skene's gland that was lanced.  She was unable to urinate at that time.  Is taking Keflex.   Hx of UTIs.  She is a patient of Dr. Vance Gather, talked to Dr. Renaldo Fiddler today.   Past Medical History  Diagnosis Date  . Migraines   . Head injury   . Complication of anesthesia   . PONV (postoperative nausea and vomiting)     Past Surgical History  Procedure Laterality Date  . Cesarean section      History reviewed. No pertinent family history.  History  Substance Use Topics  . Smoking status: Former Smoker    Types: Cigarettes    Quit date: 08/01/2010  . Smokeless tobacco: Not on file  . Alcohol Use: No    Allergies:  Allergies  Allergen Reactions  . Macrobid (Nitrofurantoin Macrocrystal)     Nausea and  vomiitng   . Codeine Nausea And Vomiting    Prescriptions prior to admission  Medication Sig Dispense Refill  . aspirin 81 MG tablet Take 81 mg by mouth daily.      . cephALEXin (KEFLEX) 500 MG capsule Take 1 capsule (500 mg total) by mouth 2 (two) times daily.  20 capsule  0  . ondansetron (ZOFRAN) 8 MG tablet Take by mouth every 8 (eight) hours as needed for nausea.      Marland Kitchen oxyCODONE-acetaminophen (PERCOCET/ROXICET) 5-325 MG per tablet Take 2 tablets by mouth once.  20 tablet  0  . Prenatal Vit-Fe Fumarate-FA (PRENATAL MULTIVITAMIN) TABS Take 1 tablet by mouth daily at 12 noon.      . promethazine (PHENERGAN) 25 MG tablet Take 1 tablet (25 mg total) by mouth every 6 (six) hours as needed for nausea.  10 tablet  0    Review of Systems  Gastrointestinal: Negative for  nausea, vomiting and abdominal pain.  Genitourinary: Positive for dysuria (stinging pain).       Inability to void, dribbles   Physical Exam   Blood pressure 119/69, pulse 73, temperature 98.4 F (36.9 C), resp. rate 18, height 5\' 9"  (1.753 m), weight 164 lb (74.39 kg), last menstrual period 10/19/2012.  Physical Exam  Constitutional: She is oriented to person, place, and time. She appears well-developed and well-nourished. No distress.  HENT:  Head: Normocephalic.  Cardiovascular: Normal rate.   GI: There is no tenderness.  Genitourinary: There is no rash, tenderness or lesion on the right labia. There is no rash, tenderness or lesion on the left labia. There is tenderness (mild tenderness under the urethra.  There is no swelling seen or nodules palpated) around the vagina. No erythema or bleeding around the vagina. No vaginal discharge found.  Neurological: She is alert and oriented to person, place, and time.  Skin: Skin is warm and dry.  Psychiatric: She has a normal mood and affect. Her behavior is normal.     Results for orders placed during the hospital encounter of 01/01/13 (from the past 24 hour(s))  URINALYSIS, ROUTINE W REFLEX MICROSCOPIC     Status: Abnormal  Collection Time    01/01/13  7:30 PM      Result Value Range   Color, Urine YELLOW  YELLOW   APPearance CLEAR  CLEAR   Specific Gravity, Urine >1.030 (*) 1.005 - 1.030   pH 6.0  5.0 - 8.0   Glucose, UA NEGATIVE  NEGATIVE mg/dL   Hgb urine dipstick NEGATIVE  NEGATIVE   Bilirubin Urine NEGATIVE  NEGATIVE   Ketones, ur 15 (*) NEGATIVE mg/dL   Protein, ur NEGATIVE  NEGATIVE mg/dL   Urobilinogen, UA 0.2  0.0 - 1.0 mg/dL   Nitrite NEGATIVE  NEGATIVE   Leukocytes, UA NEGATIVE  NEGATIVE   MAU Course  Procedures   BLADDER SCAN 159cc residual post void                      In and Out catheter  MDM 20:23  Reported patient's MSE and UA results to Dr. Renaldo Fiddler.  Orders for exam and ultrasound for a post void residual.    She called back to discuss plan of care.  We have bladder scanner from L&D --if greater than 50cc will consider catheter.  Reported residual to Dr..Adkins.  Order given for In and Out Cath.  Home to continue Keflex, and bladder train-voiding every 2-3 hrs, call for fever, chills or worsening sxs.  Follow up in office on Monday.    Assessment and Plan  A:  Urinary Retention      Recent hx of Skene's gland enlargement with needle aspiration      Pregnancy at [redacted]w[redacted]d  P:  Patient discharged to home in stable condition with instructions to continue antibiotic, call for fever, chills or worsening sxs      Follow up in the office on Monday.  ,EVE M 01/01/2013, 7:42 PM

## 2013-01-01 NOTE — Progress Notes (Signed)
Written and verbal d/c instructions given and understanding voiced. 

## 2013-01-01 NOTE — MAU Note (Signed)
Was seen here Thurs and skene's gland enlarged. Drained and felt better. Last night had alittle trouble voiding. Feels like have to go and sit on toilet and small amt comes out. Having pain lower back and abdomen

## 2013-01-13 LAB — OB RESULTS CONSOLE GC/CHLAMYDIA
Chlamydia: NEGATIVE
Gonorrhea: NEGATIVE

## 2013-03-07 ENCOUNTER — Inpatient Hospital Stay (HOSPITAL_COMMUNITY)
Admission: AD | Admit: 2013-03-07 | Discharge: 2013-03-07 | Disposition: A | Discharge status: Home / Self Care | Payer: BC Managed Care – PPO | Source: Ambulatory Visit | Attending: Obstetrics & Gynecology | Admitting: Obstetrics & Gynecology

## 2013-03-07 ENCOUNTER — Encounter (HOSPITAL_COMMUNITY): Payer: Self-pay | Admitting: *Deleted

## 2013-03-07 ENCOUNTER — Other Ambulatory Visit: Payer: Self-pay

## 2013-03-07 DIAGNOSIS — G43909 Migraine, unspecified, not intractable, without status migrainosus: Secondary | ICD-10-CM | POA: Insufficient documentation

## 2013-03-07 DIAGNOSIS — O99891 Other specified diseases and conditions complicating pregnancy: Secondary | ICD-10-CM | POA: Insufficient documentation

## 2013-03-07 MED ORDER — CYCLOBENZAPRINE HCL 10 MG PO TABS: 10.0000 mg | ORAL_TABLET | Freq: Two times a day (BID) | ORAL | Status: DC | PRN

## 2013-03-07 MED ORDER — BUTALBITAL-APAP-CAFFEINE 50-325-40 MG PO TABS
2.0000 | ORAL_TABLET | Freq: Once | ORAL | Status: AC
  Administered 2013-03-07: 2 via ORAL
  Filled 2013-03-07: qty 2

## 2013-03-07 MED ORDER — PROMETHAZINE HCL 25 MG PO TABS: 25.0000 mg | ORAL_TABLET | Freq: Four times a day (QID) | ORAL | Status: DC | PRN

## 2013-03-07 MED ORDER — BUTALBITAL-APAP-CAFFEINE 50-500-40 MG PO TABS: 1.0000 | ORAL_TABLET | ORAL | Status: DC | PRN

## 2013-03-07 NOTE — MAU Note (Signed)
Pt c/o headache since Friday. Taking tylenol without releif. MD office sent her in. Feels like her heart is racing at times.

## 2013-03-07 NOTE — MAU Provider Note (Signed)
History     CSN: [id number]  Arrival date and time: 03/07/13 1237   First Provider Initiated Contact with Patient 03/07/13 1322      Chief Complaint  Patient presents with  . Headache   HPI Pt is complaining of a migraine that has been ongoing since Friday. She has a history of migraine and was on Topamax until pregnancy. She has been stressed lately with 5 children and ex-husband issues. She feels at home sh was having a racing heartbeat. Dr Langston Masker called in Fioricet to be taken in MAU and EKG to be done now.   OB History   Grav Para Term Preterm Abortions TAB SAB Ect Mult Living   4 3 3       3       Past Medical History  Diagnosis Date  . Migraines   . Head injury   . Complication of anesthesia   . PONV (postoperative nausea and vomiting)     Past Surgical History  Procedure Laterality Date  . Cesarean section    . Wisdom tooth extraction    . Wrist fracture surgery  2010    Family History  Problem Relation Age of Onset  . Hypertension Mother   . Hypertension Father   . Diabetes Father     History  Substance Use Topics  . Smoking status: Former Smoker    Types: Cigarettes    Quit date: 08/01/2010  . Smokeless tobacco: Not on file  . Alcohol Use: No    Allergies:  Allergies  Allergen Reactions  . Macrobid (Nitrofurantoin Macrocrystal)     Nausea and  vomiitng   . Codeine Nausea And Vomiting    Prescriptions prior to admission  Medication Sig Dispense Refill  . acetaminophen (TYLENOL) 325 MG tablet Take 650 mg by mouth every 6 (six) hours as needed for pain.      Marland Kitchen aspirin 81 MG tablet Take 81 mg by mouth daily.      . Prenatal Vit-Fe Fumarate-FA (PRENATAL MULTIVITAMIN) TABS Take 1 tablet by mouth daily at 12 noon.      Marland Kitchen zolpidem (AMBIEN) 10 MG tablet Take 10 mg by mouth at bedtime as needed for sleep.        Review of Systems  Constitutional: Negative.   Eyes: Positive for photophobia.  Respiratory: Negative.   Gastrointestinal: Positive for  nausea. Negative for vomiting and abdominal pain.  Genitourinary: Negative.   Musculoskeletal: Negative.   Skin: Negative.   Neurological: Positive for dizziness and headaches.  Endo/Heme/Allergies: Negative.   Psychiatric/Behavioral: The patient is nervous/anxious and has insomnia.    Physical Exam   Blood pressure 116/68, pulse 88, temperature 97.9 F (36.6 C), temperature source Oral, resp. rate 18, height 5\' 9"  (1.753 m), weight 175 lb 9.6 oz (79.652 kg), last menstrual period 10/19/2012.  Physical Exam  Constitutional: She is oriented to person, place, and time. She appears well-developed and well-nourished.  HENT:  Head: Normocephalic and atraumatic.  Eyes: Pupils are equal, round, and reactive to light.  Cardiovascular: Normal rate and regular rhythm.   Respiratory: Effort normal and breath sounds normal.  Musculoskeletal: Normal range of motion.  Neurological: She is alert and oriented to person, place, and time. She has normal reflexes. No cranial nerve deficit. Coordination abnormal.  Skin: Skin is warm and dry.    MAU Course  Procedures  MDM Pt has history of migraine and has been very stressed lately. Dr Langston Masker was concerned about blood disorder. Pts pain went from  4/10 down to 2/10  Assessment and Plan  Migraine in pregnancy/ Dr Langston Masker advised Flexeril, Fioricet and phenergan for home  Carolynn Serve, NP   03/07/2013, 1:35 PM

## 2013-07-14 ENCOUNTER — Encounter (HOSPITAL_COMMUNITY): Payer: Self-pay

## 2013-07-17 ENCOUNTER — Encounter (HOSPITAL_COMMUNITY): Payer: Self-pay | Admitting: *Deleted

## 2013-07-17 ENCOUNTER — Inpatient Hospital Stay (HOSPITAL_COMMUNITY)
Admission: AD | Admit: 2013-07-17 | Discharge: 2013-07-17 | Disposition: A | Discharge status: Home / Self Care | Payer: BC Managed Care – PPO | Source: Ambulatory Visit | Attending: Obstetrics and Gynecology | Admitting: Obstetrics and Gynecology

## 2013-07-17 DIAGNOSIS — O34219 Maternal care for unspecified type scar from previous cesarean delivery: Secondary | ICD-10-CM | POA: Insufficient documentation

## 2013-07-17 DIAGNOSIS — O479 False labor, unspecified: Secondary | ICD-10-CM | POA: Insufficient documentation

## 2013-07-17 NOTE — MAU Note (Signed)
Pt G4 P3 at 38.5wks, previous C/S, plans for vaginal delivery. Reports contractions and bloody show.

## 2013-07-17 NOTE — MAU Note (Signed)
Dr. Marcelle Overlie notified of pt. Order rec'd.

## 2013-07-20 ENCOUNTER — Encounter (HOSPITAL_COMMUNITY): Payer: Self-pay

## 2013-07-20 ENCOUNTER — Encounter (HOSPITAL_COMMUNITY)
Admission: RE | Admit: 2013-07-20 | Discharge: 2013-07-20 | Disposition: A | Discharge status: Home / Self Care | Payer: BC Managed Care – PPO | Source: Ambulatory Visit | Attending: Obstetrics and Gynecology | Admitting: Obstetrics and Gynecology

## 2013-07-20 HISTORY — DX: Disease of blood and blood-forming organs, unspecified: D75.9

## 2013-07-20 LAB — CBC
HCT: 37.6 % (ref 36.0–46.0)
MCV: 87.6 fL (ref 78.0–100.0)
Platelets: 181 10*3/uL (ref 150–400)
RBC: 4.29 MIL/uL (ref 3.87–5.11)
RDW: 14.2 % (ref 11.5–15.5)
WBC: 13.6 10*3/uL — ABNORMAL HIGH (ref 4.0–10.5)

## 2013-07-20 LAB — TYPE AND SCREEN
ABO/RH(D): A POS
Antibody Screen: NEGATIVE

## 2013-07-20 LAB — ABO/RH: ABO/RH(D): A POS

## 2013-07-20 NOTE — Patient Instructions (Signed)
20 Alexis Lindsey  07/20/2013   Your procedure is scheduled on:  07/22/13  Enter through the Main Entrance of [facility] at 6 AM.  Pick up the phone at the desk and dial [phone].   Call this number if you have problems the morning of surgery: [phone]   Remember:   Do not eat food:After Midnight.  Do not drink clear liquids: After Midnight.  Take these medicines the morning of surgery with A SIP OF WATER: NA   Do not wear jewelry, make-up or nail polish.  Do not wear lotions, powders, or perfumes. You may wear deodorant.  Do not shave 48 hours prior to surgery.  Do not bring valuables to the hospital.  [facility] is not   responsible for any belongings or valuables brought to the hospital.  Contacts, dentures or bridgework may not be worn into surgery.  Leave suitcase in the car. After surgery it may be brought to your room.  For patients admitted to the hospital, checkout time is 11:00 AM the day of              discharge.   Patients discharged the day of surgery will not be allowed to drive             home.  Name and phone number of your driver: NA  Special Instructions:   Shower using CHG 2 nights before surgery and the night before surgery.  If you shower the day of surgery use CHG.  Use special wash - you have one bottle of CHG for all showers.  You should use approximately 1/3 of the bottle for each shower.   Please read over the following fact sheets that you were given:   Surgical Site Infection Prevention

## 2013-07-21 MED ORDER — DEXTROSE 5 % IV SOLN
2.0000 g | INTRAVENOUS | Status: DC
  Filled 2013-07-21: qty 2

## 2013-07-21 NOTE — H&P (Addendum)
Alexis Lindsey is a 32 y.o. female presenting for repeat C/S.  Prev C/S for FTp of 7+6.  Also history of preterm delivery this preg took Progesterone until 36 weeks.  First preg complicated by baby with intrauterine stroke.  + ANA has taken baby ASA throughout pregnancy. Normal Thrombophillia workup.  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  Pt also desires BTL. History OB History   Grav Para Term Preterm Abortions TAB SAB Ect Mult Living   4 3 3       3      Past Medical History  Diagnosis Date  . Migraines   . Head injury   . Complication of anesthesia   . PONV (postoperative nausea and vomiting)   . Blood dyscrasia     ANA   Past Surgical History  Procedure Laterality Date  . Cesarean section    . Wisdom tooth extraction    . Wrist fracture surgery  2010   Family History: family history includes Diabetes in her father; Hypertension in her father and mother. Social History:  reports that she quit smoking about 2 years ago. Her smoking use included Cigarettes. She smoked 0.00 packs per day. She does not have any smokeless tobacco history on file. She reports that she does not drink alcohol or use illicit drugs.   Prenatal Transfer Tool  Maternal Diabetes: No Genetic Screening: Normal Maternal Ultrasounds/Referrals: Normal Fetal Ultrasounds or other Referrals:  None Maternal Substance Abuse:  No Significant Maternal Medications:  None Significant Maternal Lab Results:  None Other Comments:  None  ROS    Last menstrual period 10/19/2012. Exam Physical Exam  1-2/50/-3 Prenatal labs: ABO, Rh: --/--/A POS, A POS (12/24 1255) Antibody: NEG (12/24 1255) Rubella: Immune (05/22 1301) RPR: NON REACTIVE (12/24 1255)  HBsAg: Negative (05/22 1301)  HIV: Non-reactive (05/22 1301)  GBS:     Assessment/Plan: IUP at 39 weeks prev C/S desires repeat Risks and benefits of C/S were discussed.  All questions were answered and informed consent was obtained.  Plan to proceed with low  segment transverse Cesarean Section. Multiparity and desires sterility.  Discussed risks and benefits of sterilization including but not limited to risk of tubal failure quoted as 11/998.  She gives her informed consent. , C 07/21/2013, 6:38 PM   07/22/13  0730 Pt presented this morning in labor with SROM of mod meconium.  She desires TOL for VBAC.  IV abx for GBS given and epidural in place with good results FHR now Cat I and ctxs q 3-4 '..  Cx  C/c/+1  Plan: TOL and anticipate SVD R&B discussed and informed consent obtained Abx for GBS No recent HSV sxs Instead of PPTL, plan IUD at [facility] visit DL

## 2013-07-22 ENCOUNTER — Inpatient Hospital Stay (HOSPITAL_COMMUNITY)
Admission: RE | Admit: 2013-07-22 | Discharge: 2013-07-23 | DRG: 775 | Disposition: A | Discharge status: Home / Self Care | Payer: BC Managed Care – PPO | Source: Ambulatory Visit | Attending: Obstetrics and Gynecology | Admitting: Obstetrics and Gynecology

## 2013-07-22 ENCOUNTER — Encounter (HOSPITAL_COMMUNITY)
Admission: RE | Disposition: A | Discharge status: Home / Self Care | Payer: Self-pay | Source: Ambulatory Visit | Attending: Obstetrics and Gynecology

## 2013-07-22 ENCOUNTER — Encounter (HOSPITAL_COMMUNITY): Payer: BC Managed Care – PPO | Admitting: Anesthesiology

## 2013-07-22 ENCOUNTER — Inpatient Hospital Stay (HOSPITAL_COMMUNITY): Payer: BC Managed Care – PPO | Admitting: Anesthesiology

## 2013-07-22 ENCOUNTER — Encounter (HOSPITAL_COMMUNITY): Payer: Self-pay | Admitting: General Practice

## 2013-07-22 DIAGNOSIS — O34219 Maternal care for unspecified type scar from previous cesarean delivery: Secondary | ICD-10-CM | POA: Diagnosis present

## 2013-07-22 DIAGNOSIS — Z87891 Personal history of nicotine dependence: Secondary | ICD-10-CM

## 2013-07-22 LAB — CBC
HCT: 38.3 % (ref 36.0–46.0)
MCHC: 35 g/dL (ref 30.0–36.0)
MCV: 86.7 fL (ref 78.0–100.0)
RBC: 4.42 MIL/uL (ref 3.87–5.11)
RDW: 14.4 % (ref 11.5–15.5)
WBC: 18.5 10*3/uL — ABNORMAL HIGH (ref 4.0–10.5)

## 2013-07-22 LAB — TYPE AND SCREEN: Antibody Screen: NEGATIVE

## 2013-07-22 SURGERY — Surgical Case
Anesthesia: Regional | Site: Abdomen | Laterality: Bilateral

## 2013-07-22 MED ORDER — OXYTOCIN BOLUS FROM INFUSION: 500.0000 mL | INTRAVENOUS | Status: DC

## 2013-07-22 MED ORDER — EPHEDRINE 5 MG/ML INJ
10.0000 mg | INTRAVENOUS | Status: DC | PRN
  Filled 2013-07-22: qty 2
  Filled 2013-07-22: qty 4

## 2013-07-22 MED ORDER — LIDOCAINE-EPINEPHRINE (PF) 2 %-1:200000 IJ SOLN
INTRAMUSCULAR | Status: AC
  Filled 2013-07-22: qty 20

## 2013-07-22 MED ORDER — DEXTROSE 5 % IV SOLN: 2.0000 g | Freq: Once | INTRAVENOUS | Status: DC

## 2013-07-22 MED ORDER — WITCH HAZEL-GLYCERIN EX PADS: 1.0000 "application " | MEDICATED_PAD | CUTANEOUS | Status: DC | PRN

## 2013-07-22 MED ORDER — LACTATED RINGERS IV SOLN
500.0000 mL | Freq: Once | INTRAVENOUS | Status: AC
  Administered 2013-07-22: 07:00:00 via INTRAVENOUS

## 2013-07-22 MED ORDER — DIBUCAINE 1 % RE OINT: 1.0000 "application " | TOPICAL_OINTMENT | RECTAL | Status: DC | PRN

## 2013-07-22 MED ORDER — CITRIC ACID-SODIUM CITRATE 334-500 MG/5ML PO SOLN: 30.0000 mL | ORAL | Status: DC | PRN

## 2013-07-22 MED ORDER — SODIUM CHLORIDE 0.9 % IV SOLN
2.0000 g | Freq: Four times a day (QID) | INTRAVENOUS | Status: DC
  Administered 2013-07-22: 2 g via INTRAVENOUS
  Filled 2013-07-22 (×4): qty 2000

## 2013-07-22 MED ORDER — SENNOSIDES-DOCUSATE SODIUM 8.6-50 MG PO TABS
2.0000 | ORAL_TABLET | ORAL | Status: DC
  Administered 2013-07-22: 2 via ORAL
  Filled 2013-07-22: qty 2

## 2013-07-22 MED ORDER — TETANUS-DIPHTH-ACELL PERTUSSIS 5-2.5-18.5 LF-MCG/0.5 IM SUSP: 0.5000 mL | Freq: Once | INTRAMUSCULAR | Status: DC

## 2013-07-22 MED ORDER — OXYCODONE-ACETAMINOPHEN 5-325 MG PO TABS
1.0000 | ORAL_TABLET | ORAL | Status: DC | PRN
  Administered 2013-07-22 – 2013-07-23 (×5): 2 via ORAL
  Filled 2013-07-22 (×5): qty 2

## 2013-07-22 MED ORDER — LIDOCAINE HCL (PF) 1 % IJ SOLN
INTRAMUSCULAR | Status: DC | PRN
  Administered 2013-07-22 (×2): 9 mL

## 2013-07-22 MED ORDER — MORPHINE SULFATE 0.5 MG/ML IJ SOLN
INTRAMUSCULAR | Status: AC
  Filled 2013-07-22: qty 10

## 2013-07-22 MED ORDER — FLEET ENEMA 7-19 GM/118ML RE ENEM: 1.0000 | ENEMA | Freq: Every day | RECTAL | Status: DC | PRN

## 2013-07-22 MED ORDER — OXYTOCIN 10 UNIT/ML IJ SOLN
INTRAMUSCULAR | Status: AC
  Filled 2013-07-22: qty 4

## 2013-07-22 MED ORDER — ONDANSETRON HCL 4 MG/2ML IJ SOLN
INTRAMUSCULAR | Status: AC
  Filled 2013-07-22: qty 2

## 2013-07-22 MED ORDER — SIMETHICONE 80 MG PO CHEW: 80.0000 mg | CHEWABLE_TABLET | ORAL | Status: DC | PRN

## 2013-07-22 MED ORDER — ONDANSETRON HCL 4 MG PO TABS: 4.0000 mg | ORAL_TABLET | ORAL | Status: DC | PRN

## 2013-07-22 MED ORDER — VALACYCLOVIR HCL 500 MG PO TABS
1000.0000 mg | ORAL_TABLET | Freq: Every day | ORAL | Status: DC
  Filled 2013-07-22 (×3): qty 2

## 2013-07-22 MED ORDER — SCOPOLAMINE 1 MG/3DAYS TD PT72: 1.0000 | MEDICATED_PATCH | Freq: Once | TRANSDERMAL | Status: DC

## 2013-07-22 MED ORDER — ONDANSETRON HCL 4 MG/2ML IJ SOLN
4.0000 mg | Freq: Four times a day (QID) | INTRAMUSCULAR | Status: DC | PRN
Start: 2013-07-22 — End: 2013-07-22
  Administered 2013-07-22: 4 mg via INTRAVENOUS
  Filled 2013-07-22: qty 2

## 2013-07-22 MED ORDER — BENZOCAINE-MENTHOL 20-0.5 % EX AERO
1.0000 "application " | INHALATION_SPRAY | CUTANEOUS | Status: DC | PRN
  Administered 2013-07-23: 1 via TOPICAL
  Filled 2013-07-22: qty 56

## 2013-07-22 MED ORDER — LACTATED RINGERS IV SOLN: 500.0000 mL | INTRAVENOUS | Status: DC | PRN

## 2013-07-22 MED ORDER — LIDOCAINE HCL (PF) 1 % IJ SOLN
30.0000 mL | INTRAMUSCULAR | Status: DC | PRN
  Filled 2013-07-22 (×2): qty 30

## 2013-07-22 MED ORDER — LANOLIN HYDROUS EX OINT: TOPICAL_OINTMENT | CUTANEOUS | Status: DC | PRN

## 2013-07-22 MED ORDER — BISACODYL 10 MG RE SUPP: 10.0000 mg | Freq: Every day | RECTAL | Status: DC | PRN

## 2013-07-22 MED ORDER — ONDANSETRON HCL 4 MG/2ML IJ SOLN: 4.0000 mg | INTRAMUSCULAR | Status: DC | PRN

## 2013-07-22 MED ORDER — ASPIRIN 81 MG PO CHEW
81.0000 mg | CHEWABLE_TABLET | Freq: Every day | ORAL | Status: DC
  Filled 2013-07-22 (×3): qty 1

## 2013-07-22 MED ORDER — ACETAMINOPHEN 325 MG PO TABS: 650.0000 mg | ORAL_TABLET | ORAL | Status: DC | PRN

## 2013-07-22 MED ORDER — IBUPROFEN 600 MG PO TABS
600.0000 mg | ORAL_TABLET | Freq: Four times a day (QID) | ORAL | Status: DC | PRN
  Administered 2013-07-22: 600 mg via ORAL
  Filled 2013-07-22 (×2): qty 1

## 2013-07-22 MED ORDER — FENTANYL 2.5 MCG/ML BUPIVACAINE 1/10 % EPIDURAL INFUSION (WH - ANES)
INTRAMUSCULAR | Status: DC | PRN
  Administered 2013-07-22: 14 mL/h via EPIDURAL

## 2013-07-22 MED ORDER — DIPHENHYDRAMINE HCL 50 MG/ML IJ SOLN: 12.5000 mg | INTRAMUSCULAR | Status: DC | PRN

## 2013-07-22 MED ORDER — OXYTOCIN 40 UNITS IN LACTATED RINGERS INFUSION - SIMPLE MED
62.5000 mL/h | INTRAVENOUS | Status: DC
  Administered 2013-07-22: 999 mL/h via INTRAVENOUS
  Filled 2013-07-22: qty 1000

## 2013-07-22 MED ORDER — PHENYLEPHRINE 40 MCG/ML (10ML) SYRINGE FOR IV PUSH (FOR BLOOD PRESSURE SUPPORT)
80.0000 ug | PREFILLED_SYRINGE | INTRAVENOUS | Status: DC | PRN
  Filled 2013-07-22: qty 2

## 2013-07-22 MED ORDER — PHENYLEPHRINE 40 MCG/ML (10ML) SYRINGE FOR IV PUSH (FOR BLOOD PRESSURE SUPPORT)
80.0000 ug | PREFILLED_SYRINGE | INTRAVENOUS | Status: DC | PRN
  Administered 2013-07-22 (×2): 80 ug via INTRAVENOUS
  Filled 2013-07-22: qty 2
  Filled 2013-07-22: qty 10

## 2013-07-22 MED ORDER — FENTANYL 2.5 MCG/ML BUPIVACAINE 1/10 % EPIDURAL INFUSION (WH - ANES)
14.0000 mL/h | INTRAMUSCULAR | Status: DC | PRN
  Filled 2013-07-22: qty 125

## 2013-07-22 MED ORDER — DIPHENHYDRAMINE HCL 25 MG PO CAPS: 25.0000 mg | ORAL_CAPSULE | Freq: Four times a day (QID) | ORAL | Status: DC | PRN

## 2013-07-22 MED ORDER — LACTATED RINGERS IV SOLN: INTRAVENOUS | Status: DC

## 2013-07-22 MED ORDER — EPHEDRINE 5 MG/ML INJ
10.0000 mg | INTRAVENOUS | Status: DC | PRN
  Filled 2013-07-22: qty 2

## 2013-07-22 MED ORDER — ZOLPIDEM TARTRATE 5 MG PO TABS: 5.0000 mg | ORAL_TABLET | Freq: Every evening | ORAL | Status: DC | PRN

## 2013-07-22 MED ORDER — LACTATED RINGERS IV SOLN
INTRAVENOUS | Status: DC
  Administered 2013-07-22 (×2): via INTRAVENOUS

## 2013-07-22 MED ORDER — IBUPROFEN 600 MG PO TABS
600.0000 mg | ORAL_TABLET | Freq: Four times a day (QID) | ORAL | Status: DC
  Administered 2013-07-22 – 2013-07-23 (×5): 600 mg via ORAL
  Filled 2013-07-22 (×5): qty 1

## 2013-07-22 MED ORDER — PRENATAL MULTIVITAMIN CH
1.0000 | ORAL_TABLET | Freq: Every day | ORAL | Status: DC
  Administered 2013-07-23: 1 via ORAL
  Filled 2013-07-22: qty 1

## 2013-07-22 MED ORDER — SODIUM BICARBONATE 8.4 % IV SOLN
INTRAVENOUS | Status: AC
  Filled 2013-07-22: qty 50

## 2013-07-22 SURGICAL SUPPLY — 28 items
CLAMP CORD UMBIL (MISCELLANEOUS) IMPLANT
CLOTH BEACON ORANGE TIMEOUT ST (SAFETY) ×1 IMPLANT
DRAPE LG THREE QUARTER DISP (DRAPES) IMPLANT
DRSG OPSITE POSTOP 4X10 (GAUZE/BANDAGES/DRESSINGS) ×1 IMPLANT
DURAPREP 26ML APPLICATOR (WOUND CARE) ×1 IMPLANT
ELECT REM PT RETURN 9FT ADLT (ELECTROSURGICAL)
ELECTRODE REM PT RTRN 9FT ADLT (ELECTROSURGICAL) ×1 IMPLANT
EXTRACTOR VACUUM M CUP 4 TUBE (SUCTIONS) IMPLANT
GLOVE SURG ORTHO 8.0 STRL STRW (GLOVE) ×1 IMPLANT
GOWN PREVENTION PLUS XLARGE (GOWN DISPOSABLE) ×1 IMPLANT
GOWN STRL NON-REIN LRG LVL3 (GOWN DISPOSABLE) ×1 IMPLANT
GOWN STRL REIN XL XLG (GOWN DISPOSABLE) ×1 IMPLANT
KIT ABG SYR 3ML LUER SLIP (SYRINGE) ×1 IMPLANT
NDL HYPO 25X5/8 SAFETYGLIDE (NEEDLE) ×1 IMPLANT
NEEDLE HYPO 25X5/8 SAFETYGLIDE (NEEDLE) IMPLANT
NS IRRIG 1000ML POUR BTL (IV SOLUTION) ×1 IMPLANT
PACK C SECTION WH (CUSTOM PROCEDURE TRAY) ×1 IMPLANT
PAD OB MATERNITY 4.3X12.25 (PERSONAL CARE ITEMS) ×1 IMPLANT
STAPLER VISISTAT 35W (STAPLE) IMPLANT
SUT MNCRL 0 VIOLET CTX 36 (SUTURE) ×3 IMPLANT
SUT MONOCRYL 0 CTX 36 (SUTURE)
SUT PDS AB 1 CT  36 (SUTURE)
SUT PDS AB 1 CT 36 (SUTURE) IMPLANT
SUT VIC AB 1 CTX 36 (SUTURE)
SUT VIC AB 1 CTX36XBRD ANBCTRL (SUTURE) IMPLANT
TOWEL OR 17X24 6PK STRL BLUE (TOWEL DISPOSABLE) ×1 IMPLANT
TRAY FOLEY CATH 14FR (SET/KITS/TRAYS/PACK) ×1 IMPLANT
WATER STERILE IRR 1000ML POUR (IV SOLUTION) ×1 IMPLANT

## 2013-07-22 NOTE — Lactation Note (Signed)
This note was copied from the chart of Alexis Lindsey. Lactation Consultation Note  Patient Name: Alexis Lindsey [id number] Date: 07/22/2013 Reason for consult: Initial assessment Mom reports baby has nursed well few times on right breast but she has not latched to left breast. This is Mom's 1st time BF. BF basics reviewed. Encouraged to continue to que base BF, cluster feeding discussed. Lactation brochure left for review, advised of OP services and support group. Encouraged Mom to call with next feeding for assist with latching baby on left breast.   Maternal Data Formula Feeding for Exclusion: No Infant to breast within first hour of birth: Yes Has patient been taught Hand Expression?: Yes Does the patient have breastfeeding experience prior to this delivery?: No  Feeding Feeding Type: Breast Fed Length of feed: 10 min  LATCH Score/Interventions                      Lactation Tools Discussed/Used     Consult Status Consult Status: Follow-up Date: 07/23/13 Follow-up type: In-patient    Alfred Levins 07/22/2013, 6:45 PM

## 2013-07-22 NOTE — Anesthesia Preprocedure Evaluation (Signed)
Anesthesia Evaluation  Patient identified by MRN, date of birth, ID band Patient awake    Reviewed: Allergy & Precautions, H&P , NPO status , Patient's Chart, lab work & pertinent test results  Airway Mallampati: I TM Distance: >3 FB Neck ROM: full    Dental no notable dental hx.    Pulmonary neg pulmonary ROS, former smoker,    Pulmonary exam normal       Cardiovascular negative cardio ROS      Neuro/Psych negative neurological ROS  negative psych ROS   GI/Hepatic negative GI ROS, Neg liver ROS,   Endo/Other  negative endocrine ROS  Renal/GU negative Renal ROS     Musculoskeletal negative musculoskeletal ROS (+)   Abdominal Normal abdominal exam  (+)   Peds  Hematology   Anesthesia Other Findings   Reproductive/Obstetrics (+) Pregnancy                           Anesthesia Physical Anesthesia Plan  ASA: II  Anesthesia Plan: Epidural   Post-op Pain Management:    Induction:   Airway Management Planned:   Additional Equipment:   Intra-op Plan:   Post-operative Plan:   Informed Consent: I have reviewed the patients History and Physical, chart, labs and discussed the procedure including the risks, benefits and alternatives for the proposed anesthesia with the patient or authorized representative who has indicated his/her understanding and acceptance.     Plan Discussed with:   Anesthesia Plan Comments:         Anesthesia Quick Evaluation

## 2013-07-22 NOTE — Progress Notes (Signed)
Delivery Note At 9:36 AM a viable female was delivered via VBAC, Spontaneous (Presentation:OA ;  ).  APGAR:pending , ; weight pending .   Placenta status:intact ,to path .  Cord: 3 vessels  with the following complications:none .  Cord pH: Pending  Anesthesia: Epidural  Episiotomy: none Lacerations: second degree ML lac repaired, superficial right periurethral lac not bleeding, not repaired Suture Repair: 2.0 chromic Est. Blood Loss (mL):  Peds present for delivery for meconium Mom to postpartum.  Baby to Couplet care / Skin to Skin.   II, E 07/22/2013, 9:51 AM

## 2013-07-22 NOTE — Anesthesia Procedure Notes (Signed)
Epidural Patient location during procedure: OB Start time: 07/22/2013 6:57 AM End time: 07/22/2013 7:00 AM  Staffing Anesthesiologist: Leilani Able Performed by: anesthesiologist   Preanesthetic Checklist Completed: patient identified, surgical consent, pre-op evaluation, timeout performed, IV checked, risks and benefits discussed and monitors and equipment checked  Epidural Patient position: sitting Prep: site prepped and draped and DuraPrep Patient monitoring: continuous pulse ox and blood pressure Approach: midline Injection technique: LOR air  Needle:  Needle type: Tuohy  Needle gauge: 17 G Needle length: 9 cm and 9 Needle insertion depth: 5 cm cm Catheter type: closed end flexible Catheter size: 19 Gauge Catheter at skin depth: 10 cm Test dose: negative and Other  Assessment Sensory level: T9 Events: blood not aspirated, injection not painful, no injection resistance, negative IV test and paresthesia  Additional Notes R leg X 3Reason for block:procedure for pain

## 2013-07-22 NOTE — MAU Note (Signed)
Pt c/o ucs since 0300

## 2013-07-23 LAB — CBC
HCT: 32.9 % — ABNORMAL LOW (ref 36.0–46.0)
MCHC: 34 g/dL (ref 30.0–36.0)
Platelets: 158 10*3/uL (ref 150–400)
RBC: 3.72 MIL/uL — ABNORMAL LOW (ref 3.87–5.11)
RDW: 14.7 % (ref 11.5–15.5)
WBC: 17.7 10*3/uL — ABNORMAL HIGH (ref 4.0–10.5)

## 2013-07-23 MED ORDER — OXYCODONE-ACETAMINOPHEN 5-325 MG PO TABS: 1.0000 | ORAL_TABLET | Freq: Four times a day (QID) | ORAL | Status: DC | PRN

## 2013-07-23 MED ORDER — IBUPROFEN 600 MG PO TABS: 600.0000 mg | ORAL_TABLET | Freq: Four times a day (QID) | ORAL | Status: DC | PRN

## 2013-07-23 NOTE — Progress Notes (Signed)
Post Partum Day 1 Subjective: no complaints, up ad lib, voiding, tolerating PO and + flatus  Objective: Blood pressure 94/59, pulse 67, temperature 98.5 F (36.9 C), temperature source Oral, resp. rate 19, height 5\' 6"  (1.676 m), weight 217 lb (98.431 kg), last menstrual period 10/19/2012, SpO2 97.00%, unknown if currently breastfeeding.  Physical Exam:  General: alert, cooperative and no distress Lochia: appropriate Uterine Fundus: firm Incision: healing well DVT Evaluation: No evidence of DVT seen on physical exam.   Recent Labs  07/22/13 0610 07/23/13 0605  HGB 13.4 11.2*  HCT 38.3 32.9*    Assessment/Plan: Discharge home   LOS: 1 day    II, E 07/23/2013, 8:59 AM

## 2013-07-23 NOTE — Discharge Summary (Signed)
Obstetric Discharge Summary Reason for Admission: onset of labor Prenatal Procedures: ultrasound Intrapartum Procedures: spontaneous vaginal delivery Postpartum Procedures: none Complications-Operative and Postpartum: none HGB  Date Value Range Status  08/06/2007 15.5  11.6 - 15.9 g/dL Final     Hemoglobin  Date Value Range Status  07/23/2013 11.2* 12.0 - 15.0 g/dL Final     HCT  Date Value Range Status  07/23/2013 32.9* 36.0 - 46.0 % Final  08/06/2007 45.0  34.8 - 46.6 % Final    Physical Exam:  General: alert, cooperative and no distress Lochia: appropriate Uterine Fundus: firm Incision: healing well DVT Evaluation: No evidence of DVT seen on physical exam.  Discharge Diagnoses: Term Pregnancy-delivered  Discharge Information: Date: 07/23/2013 Activity: pelvic rest Diet: routine Medications: PNV, Ibuprofen and Percocet Condition: stable Instructions: refer to practice specific booklet Discharge to: home   Newborn Data: Live born female  Birth Weight: 7 lb 4.6 oz (3306 g) APGAR: 9, 9  Home with mother.   II, E 07/23/2013, 9:01 AM

## 2013-07-23 NOTE — Anesthesia Postprocedure Evaluation (Signed)
Anesthesia Post Note  Patient: Alexis Lindsey  Procedure(s) Performed: * No procedures listed *  Anesthesia type: Epidural  Patient location: Mother/Baby  Post pain: Pain level controlled  Post assessment: Post-op Vital signs reviewed  Last Vitals:  Filed Vitals:   07/23/13 0529  BP: 94/59  Pulse: 67  Temp: 36.9 C  Resp: 19    Post vital signs: Reviewed  Level of consciousness:alert  Complications: No apparent anesthesia complications

## 2013-07-24 ENCOUNTER — Ambulatory Visit: Payer: Self-pay

## 2013-07-24 NOTE — Lactation Note (Signed)
This note was copied from the chart of Alexis Kourtnei Rauber. Lactation Consultation Note: Mom had baby latched to breast when I went into room- assisted mom with getting a deeper latch and mom reports that feels better. Right nipple with positional stripe- comfort gels given and placed on that nipple. Mom reports that feels better. Encouraged to change positions so baby's mouth is not in the exact position each time. No questions at present. To call prn.  Patient Name: Alexis Lindsey [id number] Date: 07/24/2013 Reason for consult: Follow-up assessment   Maternal Data    Feeding Feeding Type: Breast Fed  LATCH Score/Interventions Latch: Grasps breast easily, tongue down, lips flanged, rhythmical sucking.  Audible Swallowing: A few with stimulation  Type of Nipple: Everted at rest and after stimulation  Comfort (Breast/Nipple): Filling, red/small blisters or bruises, mild/mod discomfort  Problem noted: Mild/Moderate discomfort;Cracked, bleeding, blisters, bruises Interventions (Mild/moderate discomfort): Comfort gels  Hold (Positioning): Assistance needed to correctly position infant at breast and maintain latch. Intervention(s): Breastfeeding basics reviewed;Support Pillows;Position options  LATCH Score: 7  Lactation Tools Discussed/Used     Consult Status Consult Status: Complete    Pamelia Hoit 07/24/2013, 10:01 AM

## 2014-05-29 ENCOUNTER — Encounter (HOSPITAL_COMMUNITY): Payer: Self-pay | Admitting: General Practice

## 2014-07-28 NOTE — L&D Delivery Note (Signed)
Delivery Note  SVD viable female Apgars 9,9 over 2nd degree ML laceration.  Placenta delivered spontaneously intact with 3VC. Repair with 2-0 Chromic with good support and hemostasis noted and R/V exam confirms.  PH art was sent.  [place] cord blood was not done.  Mother and baby were doing well.  EBL 150cc  Pt desires PPTL.  R&B discussed and informed consent obtained.  Posted for 0830 in am.  Candice Camp, MD

## 2014-08-10 ENCOUNTER — Encounter (HOSPITAL_COMMUNITY): Payer: Self-pay | Admitting: Obstetrics and Gynecology

## 2014-09-18 LAB — OB RESULTS CONSOLE RUBELLA ANTIBODY, IGM: Rubella: IMMUNE

## 2014-09-18 LAB — OB RESULTS CONSOLE ABO/RH: RH Type: POSITIVE

## 2014-09-18 LAB — OB RESULTS CONSOLE HIV ANTIBODY (ROUTINE TESTING): HIV: NONREACTIVE

## 2014-09-18 LAB — OB RESULTS CONSOLE GC/CHLAMYDIA
Chlamydia: NEGATIVE
Gonorrhea: NEGATIVE

## 2014-09-18 LAB — OB RESULTS CONSOLE HEPATITIS B SURFACE ANTIGEN: HEP B S AG: NEGATIVE

## 2014-09-18 LAB — OB RESULTS CONSOLE ANTIBODY SCREEN: ANTIBODY SCREEN: NEGATIVE

## 2014-09-18 LAB — OB RESULTS CONSOLE RPR: RPR: NONREACTIVE

## 2014-09-22 ENCOUNTER — Other Ambulatory Visit: Payer: Self-pay | Admitting: Obstetrics and Gynecology

## 2014-09-25 LAB — CYTOLOGY - PAP

## 2014-11-05 ENCOUNTER — Encounter (HOSPITAL_COMMUNITY): Payer: Self-pay

## 2014-11-05 ENCOUNTER — Inpatient Hospital Stay (HOSPITAL_COMMUNITY)
Admission: AD | Admit: 2014-11-05 | Discharge: 2014-11-06 | Disposition: A | Discharge status: Home / Self Care | Payer: BLUE CROSS/BLUE SHIELD | Source: Ambulatory Visit | Attending: Obstetrics and Gynecology | Admitting: Obstetrics and Gynecology

## 2014-11-05 DIAGNOSIS — R112 Nausea with vomiting, unspecified: Secondary | ICD-10-CM | POA: Insufficient documentation

## 2014-11-05 DIAGNOSIS — A084 Viral intestinal infection, unspecified: Secondary | ICD-10-CM | POA: Diagnosis not present

## 2014-11-05 DIAGNOSIS — O98512 Other viral diseases complicating pregnancy, second trimester: Secondary | ICD-10-CM | POA: Diagnosis not present

## 2014-11-05 DIAGNOSIS — Z3A17 17 weeks gestation of pregnancy: Secondary | ICD-10-CM | POA: Diagnosis not present

## 2014-11-05 DIAGNOSIS — O9989 Other specified diseases and conditions complicating pregnancy, childbirth and the puerperium: Secondary | ICD-10-CM | POA: Diagnosis not present

## 2014-11-05 DIAGNOSIS — Z87891 Personal history of nicotine dependence: Secondary | ICD-10-CM | POA: Diagnosis not present

## 2014-11-05 DIAGNOSIS — R1310 Dysphagia, unspecified: Secondary | ICD-10-CM | POA: Diagnosis not present

## 2014-11-05 MED ORDER — GI COCKTAIL ~~LOC~~
30.0000 mL | Freq: Once | ORAL | Status: AC
  Administered 2014-11-05: 30 mL via ORAL
  Filled 2014-11-05: qty 30

## 2014-11-05 MED ORDER — LACTATED RINGERS IV SOLN
INTRAVENOUS | Status: DC
  Administered 2014-11-05: 22:00:00 via INTRAVENOUS

## 2014-11-05 MED ORDER — DEXTROSE IN LACTATED RINGERS 5 % IV SOLN: INTRAVENOUS | Status: DC

## 2014-11-05 MED ORDER — ONDANSETRON HCL 4 MG/2ML IJ SOLN
4.0000 mg | Freq: Once | INTRAMUSCULAR | Status: AC
  Administered 2014-11-05: 4 mg via INTRAVENOUS
  Filled 2014-11-05: qty 2

## 2014-11-05 NOTE — MAU Note (Signed)
Pt presents complaining of nausea and vomiting since 4am. States she cannot keep anything down including fluids. Tried phernergan PO but can't keep it down. Denies vaginal bleeding or leaking. Denies pain.

## 2014-11-05 NOTE — MAU Provider Note (Signed)
History     CSN: [id number]  Arrival date and time: 11/05/14 2119   First Provider Initiated Contact with Patient 11/05/14 2143      Chief Complaint  Patient presents with  . Nausea  . Emesis   Emesis  This is a new problem. The current episode started today. The problem occurs more than 10 times per day. The problem has been unchanged. The emesis has an appearance of stomach contents. There has been no fever. Pertinent negatives include no abdominal pain, arthralgias, chills, diarrhea, dizziness, fever, headaches or myalgias. Treatments tried: Phenergan. The treatment provided no relief.   This is a 34 y.o. female at 7579w3d who presents with c/o nausea and vomiting since 4am this morning. Cannot keep Phenergan down. Denies fever, chills, abdominal pain or bleeding. Does have soreness in throat from vomiting. No sick contacts at home.   RN Note:  Expand All Collapse All   Pt presents complaining of nausea and vomiting since 4am. States she cannot keep anything down including fluids. Tried phernergan PO but can't keep it down. Denies vaginal bleeding or leaking. Denies pain.           OB History    Gravida Para Term Preterm AB TAB SAB Ectopic Multiple Living   5 4 4       4       Past Medical History  Diagnosis Date  . Migraines   . Head injury   . Complication of anesthesia   . PONV (postoperative nausea and vomiting)   . Blood dyscrasia     ANA    Past Surgical History  Procedure Laterality Date  . Cesarean section    . Wisdom tooth extraction    . Wrist fracture surgery  2010    Family History  Problem Relation Age of Onset  . Hypertension Mother   . Hypertension Father   . Diabetes Father     History  Substance Use Topics  . Smoking status: Former Smoker    Types: Cigarettes    Quit date: 08/01/2010  . Smokeless tobacco: Not on file  . Alcohol Use: No    Allergies:  Allergies  Allergen Reactions  . Macrobid [Nitrofurantoin Macrocrystal]    Nausea and  vomiitng   . Codeine Nausea And Vomiting    Prescriptions prior to admission  Medication Sig Dispense Refill Last Dose  . ibuprofen (ADVIL,MOTRIN) 600 MG tablet Take 1 tablet (600 mg total) by mouth every 6 (six) hours as needed. 30 tablet 0   . oxyCODONE-acetaminophen (PERCOCET/ROXICET) 5-325 MG per tablet Take 1-2 tablets by mouth every 6 (six) hours as needed for severe pain (moderate - severe pain). 30 tablet 0   . Prenatal Vit-Fe Fumarate-FA (PRENATAL MULTIVITAMIN) TABS Take 1 tablet by mouth daily at 12 noon.   07/16/2013 at Unknown time  . valACYclovir (VALTREX) 1000 MG tablet Take 1,000 mg by mouth daily.   07/16/2013 at Unknown time    Review of Systems  Constitutional: Positive for malaise/fatigue. Negative for fever and chills.  Gastrointestinal: Positive for nausea and vomiting. Negative for abdominal pain, diarrhea and constipation.  Genitourinary: Negative for dysuria.  Musculoskeletal: Negative for myalgias and arthralgias.  Neurological: Positive for weakness. Negative for dizziness, focal weakness and headaches.   Physical Exam   Blood pressure 117/76, pulse 108, temperature 97.8 F (36.6 C), temperature source Oral, resp. rate 18, height 5\' 8"  (1.727 m), weight 172 lb (78.019 kg), SpO2 99 %, unknown if currently breastfeeding.  Physical Exam  Constitutional: She is oriented to person, place, and time. She appears well-developed and well-nourished. No distress.  HENT:  Head: Normocephalic.  Cardiovascular: Normal rate and regular rhythm.   Respiratory: Effort normal. No respiratory distress.  GI: Soft. She exhibits no distension and no mass. There is tenderness (generailzed, mild tenderness throughout, mostly upper abdomen). There is no rebound and no guarding.  + FHTs per RN Uterus gravid, c/w dates  Musculoskeletal: Normal range of motion.  Neurological: She is alert and oriented to person, place, and time.  Skin: Skin is warm and dry.  Psychiatric:  She has a normal mood and affect.    MAU Course  Procedures  MDM Given 2 liters of fluid and Zofran (since pt is driving)  Discussed possible risks of Zofran, patient willing to try one dose  >> had good relief. GI cocktail given for dysphagia/discomfort with excellent immediate relief.  After second liter, felt better and felt need to urinate. Wants to go home. Has Rx for Phenergan at home.  Assessment and Plan  A:  SIUP at [redacted]w[redacted]d       Probable viral gastroenteritis  P:  Discharge home       Phenergan PRN at home       Advance diet as tolerated       Informed to call us or Dr Henderson Cloud if she does not feel better in a few days.       Followup in office for prenatal care  [facility] 11/05/2014, 9:49 PM

## 2014-11-06 DIAGNOSIS — Z3A17 17 weeks gestation of pregnancy: Secondary | ICD-10-CM | POA: Diagnosis not present

## 2014-11-06 DIAGNOSIS — O98512 Other viral diseases complicating pregnancy, second trimester: Secondary | ICD-10-CM

## 2014-11-06 DIAGNOSIS — A084 Viral intestinal infection, unspecified: Secondary | ICD-10-CM | POA: Diagnosis not present

## 2014-11-06 DIAGNOSIS — O9989 Other specified diseases and conditions complicating pregnancy, childbirth and the puerperium: Secondary | ICD-10-CM | POA: Diagnosis not present

## 2014-11-06 LAB — URINALYSIS, ROUTINE W REFLEX MICROSCOPIC
BILIRUBIN URINE: NEGATIVE
GLUCOSE, UA: 500 mg/dL — AB
Hgb urine dipstick: NEGATIVE
Ketones, ur: 40 mg/dL — AB
LEUKOCYTES UA: NEGATIVE
Nitrite: NEGATIVE
Protein, ur: NEGATIVE mg/dL
Specific Gravity, Urine: 1.02 (ref 1.005–1.030)
UROBILINOGEN UA: 1 mg/dL (ref 0.0–1.0)
pH: 6 (ref 5.0–8.0)

## 2014-11-06 MED ORDER — LACTATED RINGERS IV BOLUS (SEPSIS)
1000.0000 mL | Freq: Once | INTRAVENOUS | Status: AC
  Administered 2014-11-05: 1000 mL via INTRAVENOUS

## 2014-11-06 NOTE — Discharge Instructions (Signed)

## 2015-01-04 ENCOUNTER — Encounter (HOSPITAL_COMMUNITY): Payer: Self-pay | Admitting: Obstetrics and Gynecology

## 2015-03-14 LAB — OB RESULTS CONSOLE GBS: GBS: POSITIVE

## 2015-04-03 ENCOUNTER — Encounter (HOSPITAL_COMMUNITY): Payer: Self-pay | Admitting: *Deleted

## 2015-04-03 ENCOUNTER — Telehealth (HOSPITAL_COMMUNITY): Payer: Self-pay | Admitting: *Deleted

## 2015-04-03 NOTE — Telephone Encounter (Signed)
Preadmission screen  

## 2015-04-06 ENCOUNTER — Inpatient Hospital Stay (HOSPITAL_COMMUNITY)
Admission: AD | Admit: 2015-04-06 | Discharge: 2015-04-08 | DRG: 767 | Disposition: A | Discharge status: Home / Self Care | Payer: BLUE CROSS/BLUE SHIELD | Source: Ambulatory Visit | Attending: Obstetrics and Gynecology | Admitting: Obstetrics and Gynecology

## 2015-04-06 ENCOUNTER — Inpatient Hospital Stay (HOSPITAL_COMMUNITY): Payer: BLUE CROSS/BLUE SHIELD | Admitting: Anesthesiology

## 2015-04-06 ENCOUNTER — Encounter (HOSPITAL_COMMUNITY): Payer: Self-pay | Admitting: *Deleted

## 2015-04-06 DIAGNOSIS — R112 Nausea with vomiting, unspecified: Secondary | ICD-10-CM | POA: Diagnosis present

## 2015-04-06 DIAGNOSIS — O3421 Maternal care for scar from previous cesarean delivery: Secondary | ICD-10-CM | POA: Diagnosis present

## 2015-04-06 DIAGNOSIS — F329 Major depressive disorder, single episode, unspecified: Secondary | ICD-10-CM | POA: Diagnosis present

## 2015-04-06 DIAGNOSIS — Z3490 Encounter for supervision of normal pregnancy, unspecified, unspecified trimester: Secondary | ICD-10-CM

## 2015-04-06 DIAGNOSIS — O9852 Other viral diseases complicating childbirth: Secondary | ICD-10-CM | POA: Diagnosis present

## 2015-04-06 DIAGNOSIS — Z87891 Personal history of nicotine dependence: Secondary | ICD-10-CM | POA: Diagnosis not present

## 2015-04-06 DIAGNOSIS — B009 Herpesviral infection, unspecified: Secondary | ICD-10-CM | POA: Diagnosis present

## 2015-04-06 DIAGNOSIS — Z8249 Family history of ischemic heart disease and other diseases of the circulatory system: Secondary | ICD-10-CM

## 2015-04-06 DIAGNOSIS — R760 Raised antibody titer: Secondary | ICD-10-CM | POA: Diagnosis present

## 2015-04-06 DIAGNOSIS — O09529 Supervision of elderly multigravida, unspecified trimester: Secondary | ICD-10-CM

## 2015-04-06 DIAGNOSIS — Z91410 Personal history of adult physical and sexual abuse: Secondary | ICD-10-CM | POA: Diagnosis not present

## 2015-04-06 DIAGNOSIS — O9942 Diseases of the circulatory system complicating childbirth: Principal | ICD-10-CM | POA: Diagnosis present

## 2015-04-06 DIAGNOSIS — Z302 Encounter for sterilization: Secondary | ICD-10-CM | POA: Diagnosis not present

## 2015-04-06 DIAGNOSIS — O46009 Antepartum hemorrhage with coagulation defect, unspecified, unspecified trimester: Secondary | ICD-10-CM | POA: Diagnosis present

## 2015-04-06 DIAGNOSIS — O99344 Other mental disorders complicating childbirth: Secondary | ICD-10-CM | POA: Diagnosis present

## 2015-04-06 DIAGNOSIS — O99824 Streptococcus B carrier state complicating childbirth: Secondary | ICD-10-CM | POA: Diagnosis present

## 2015-04-06 DIAGNOSIS — G43909 Migraine, unspecified, not intractable, without status migrainosus: Secondary | ICD-10-CM | POA: Diagnosis present

## 2015-04-06 DIAGNOSIS — Z3A00 Weeks of gestation of pregnancy not specified: Secondary | ICD-10-CM | POA: Diagnosis present

## 2015-04-06 DIAGNOSIS — O99354 Diseases of the nervous system complicating childbirth: Secondary | ICD-10-CM | POA: Diagnosis present

## 2015-04-06 DIAGNOSIS — Z6830 Body mass index (BMI) 30.0-30.9, adult: Secondary | ICD-10-CM | POA: Diagnosis not present

## 2015-04-06 DIAGNOSIS — Z809 Family history of malignant neoplasm, unspecified: Secondary | ICD-10-CM | POA: Diagnosis not present

## 2015-04-06 DIAGNOSIS — Z833 Family history of diabetes mellitus: Secondary | ICD-10-CM | POA: Diagnosis not present

## 2015-04-06 DIAGNOSIS — O99214 Obesity complicating childbirth: Secondary | ICD-10-CM | POA: Diagnosis present

## 2015-04-06 DIAGNOSIS — F419 Anxiety disorder, unspecified: Secondary | ICD-10-CM | POA: Diagnosis present

## 2015-04-06 DIAGNOSIS — E669 Obesity, unspecified: Secondary | ICD-10-CM | POA: Diagnosis present

## 2015-04-06 LAB — CBC
HEMATOCRIT: 38.3 % (ref 36.0–46.0)
HEMOGLOBIN: 13 g/dL (ref 12.0–15.0)
MCH: 29 pg (ref 26.0–34.0)
MCHC: 33.9 g/dL (ref 30.0–36.0)
MCV: 85.3 fL (ref 78.0–100.0)
Platelets: 164 10*3/uL (ref 150–400)
RBC: 4.49 MIL/uL (ref 3.87–5.11)
RDW: 14.3 % (ref 11.5–15.5)
WBC: 10.7 10*3/uL — ABNORMAL HIGH (ref 4.0–10.5)

## 2015-04-06 LAB — TYPE AND SCREEN
ABO/RH(D): A POS
ANTIBODY SCREEN: NEGATIVE

## 2015-04-06 MED ORDER — WITCH HAZEL-GLYCERIN EX PADS: 1.0000 "application " | MEDICATED_PAD | CUTANEOUS | Status: DC | PRN

## 2015-04-06 MED ORDER — ONDANSETRON HCL 4 MG/2ML IJ SOLN
4.0000 mg | Freq: Four times a day (QID) | INTRAMUSCULAR | Status: DC | PRN
  Administered 2015-04-06 (×2): 4 mg via INTRAVENOUS
  Filled 2015-04-06 (×2): qty 2

## 2015-04-06 MED ORDER — DIPHENHYDRAMINE HCL 25 MG PO CAPS: 25.0000 mg | ORAL_CAPSULE | Freq: Four times a day (QID) | ORAL | Status: DC | PRN

## 2015-04-06 MED ORDER — SODIUM CHLORIDE 0.9 % IV SOLN
2.0000 g | Freq: Four times a day (QID) | INTRAVENOUS | Status: DC
  Administered 2015-04-06: 2 g via INTRAVENOUS
  Filled 2015-04-06 (×3): qty 2000

## 2015-04-06 MED ORDER — BENZOCAINE-MENTHOL 20-0.5 % EX AERO
1.0000 "application " | INHALATION_SPRAY | CUTANEOUS | Status: DC | PRN
  Administered 2015-04-07: 1 via TOPICAL
  Filled 2015-04-06: qty 56

## 2015-04-06 MED ORDER — PHENYLEPHRINE 40 MCG/ML (10ML) SYRINGE FOR IV PUSH (FOR BLOOD PRESSURE SUPPORT)
80.0000 ug | PREFILLED_SYRINGE | INTRAVENOUS | Status: DC | PRN
  Administered 2015-04-06: 80 ug via INTRAVENOUS
  Filled 2015-04-06: qty 20

## 2015-04-06 MED ORDER — LACTATED RINGERS IV SOLN
INTRAVENOUS | Status: DC
  Administered 2015-04-06: 1000 mL via INTRAVENOUS

## 2015-04-06 MED ORDER — CITRIC ACID-SODIUM CITRATE 334-500 MG/5ML PO SOLN
30.0000 mL | ORAL | Status: DC | PRN
  Administered 2015-04-06: 30 mL via ORAL
  Filled 2015-04-06: qty 15

## 2015-04-06 MED ORDER — OXYTOCIN BOLUS FROM INFUSION: 500.0000 mL | INTRAVENOUS | Status: DC

## 2015-04-06 MED ORDER — IBUPROFEN 600 MG PO TABS
600.0000 mg | ORAL_TABLET | Freq: Four times a day (QID) | ORAL | Status: DC
  Administered 2015-04-06 – 2015-04-08 (×5): 600 mg via ORAL
  Filled 2015-04-06 (×5): qty 1

## 2015-04-06 MED ORDER — OXYTOCIN 40 UNITS IN LACTATED RINGERS INFUSION - SIMPLE MED
62.5000 mL/h | INTRAVENOUS | Status: DC
  Administered 2015-04-06: 2 m[IU]/min via INTRAVENOUS
  Filled 2015-04-06: qty 1000

## 2015-04-06 MED ORDER — ACETAMINOPHEN 325 MG PO TABS: 650.0000 mg | ORAL_TABLET | ORAL | Status: DC | PRN

## 2015-04-06 MED ORDER — OXYCODONE-ACETAMINOPHEN 5-325 MG PO TABS
2.0000 | ORAL_TABLET | ORAL | Status: DC | PRN
  Administered 2015-04-08: 2 via ORAL

## 2015-04-06 MED ORDER — MEDROXYPROGESTERONE ACETATE 150 MG/ML IM SUSP: 150.0000 mg | INTRAMUSCULAR | Status: DC | PRN

## 2015-04-06 MED ORDER — PHENYLEPHRINE 40 MCG/ML (10ML) SYRINGE FOR IV PUSH (FOR BLOOD PRESSURE SUPPORT): 80.0000 ug | PREFILLED_SYRINGE | INTRAVENOUS | Status: DC | PRN

## 2015-04-06 MED ORDER — SODIUM CHLORIDE 0.9 % IV SOLN: 14.0000 mL/h | INTRAVENOUS | Status: DC | PRN

## 2015-04-06 MED ORDER — ZOLPIDEM TARTRATE 5 MG PO TABS: 5.0000 mg | ORAL_TABLET | Freq: Every evening | ORAL | Status: DC | PRN

## 2015-04-06 MED ORDER — DIBUCAINE 1 % RE OINT: 1.0000 "application " | TOPICAL_OINTMENT | RECTAL | Status: DC | PRN

## 2015-04-06 MED ORDER — SENNOSIDES-DOCUSATE SODIUM 8.6-50 MG PO TABS
2.0000 | ORAL_TABLET | ORAL | Status: DC
  Administered 2015-04-06 – 2015-04-07 (×2): 2 via ORAL
  Filled 2015-04-06 (×2): qty 2

## 2015-04-06 MED ORDER — PRENATAL MULTIVITAMIN CH: 1.0000 | ORAL_TABLET | Freq: Every day | ORAL | Status: DC

## 2015-04-06 MED ORDER — TERBUTALINE SULFATE 1 MG/ML IJ SOLN: 0.2500 mg | Freq: Once | INTRAMUSCULAR | Status: DC | PRN

## 2015-04-06 MED ORDER — LACTATED RINGERS IV SOLN: 500.0000 mL | INTRAVENOUS | Status: DC | PRN

## 2015-04-06 MED ORDER — LIDOCAINE HCL (PF) 1 % IJ SOLN: 30.0000 mL | INTRAMUSCULAR | Status: DC | PRN

## 2015-04-06 MED ORDER — ONDANSETRON HCL 4 MG PO TABS: 4.0000 mg | ORAL_TABLET | ORAL | Status: DC | PRN

## 2015-04-06 MED ORDER — TETANUS-DIPHTH-ACELL PERTUSSIS 5-2.5-18.5 LF-MCG/0.5 IM SUSP: 0.5000 mL | Freq: Once | INTRAMUSCULAR | Status: DC

## 2015-04-06 MED ORDER — OXYTOCIN 40 UNITS IN LACTATED RINGERS INFUSION - SIMPLE MED: 1.0000 m[IU]/min | INTRAVENOUS | Status: DC

## 2015-04-06 MED ORDER — SIMETHICONE 80 MG PO CHEW: 80.0000 mg | CHEWABLE_TABLET | ORAL | Status: DC | PRN

## 2015-04-06 MED ORDER — LIDOCAINE HCL (PF) 1 % IJ SOLN
INTRAMUSCULAR | Status: DC | PRN
  Administered 2015-04-06: 4 mL via EPIDURAL
  Administered 2015-04-06: 4 mL

## 2015-04-06 MED ORDER — DIPHENHYDRAMINE HCL 50 MG/ML IJ SOLN: 12.5000 mg | INTRAMUSCULAR | Status: DC | PRN

## 2015-04-06 MED ORDER — FLEET ENEMA 7-19 GM/118ML RE ENEM: 1.0000 | ENEMA | RECTAL | Status: DC | PRN

## 2015-04-06 MED ORDER — OXYCODONE-ACETAMINOPHEN 5-325 MG PO TABS
1.0000 | ORAL_TABLET | ORAL | Status: DC | PRN
  Administered 2015-04-07 – 2015-04-08 (×5): 1 via ORAL
  Filled 2015-04-06 (×7): qty 1

## 2015-04-06 MED ORDER — MEASLES, MUMPS & RUBELLA VAC ~~LOC~~ INJ: 0.5000 mL | INJECTION | Freq: Once | SUBCUTANEOUS | Status: DC

## 2015-04-06 MED ORDER — DIPHENHYDRAMINE HCL 50 MG/ML IJ SOLN
12.5000 mg | INTRAMUSCULAR | Status: DC | PRN
  Administered 2015-04-06: 12.5 mg via INTRAVENOUS
  Filled 2015-04-06: qty 1

## 2015-04-06 MED ORDER — INFLUENZA VAC SPLIT QUAD 0.5 ML IM SUSY
0.5000 mL | PREFILLED_SYRINGE | INTRAMUSCULAR | Status: AC
  Administered 2015-04-08: 0.5 mL via INTRAMUSCULAR
  Filled 2015-04-06: qty 0.5

## 2015-04-06 MED ORDER — FENTANYL 2.5 MCG/ML BUPIVACAINE 1/10 % EPIDURAL INFUSION (WH - ANES)
14.0000 mL/h | INTRAMUSCULAR | Status: DC | PRN
  Administered 2015-04-06: 15 mL/h via EPIDURAL
  Administered 2015-04-06: 14 mL/h via EPIDURAL
  Filled 2015-04-06: qty 125

## 2015-04-06 MED ORDER — LANOLIN HYDROUS EX OINT: TOPICAL_OINTMENT | CUTANEOUS | Status: DC | PRN

## 2015-04-06 MED ORDER — ONDANSETRON HCL 4 MG/2ML IJ SOLN: 4.0000 mg | INTRAMUSCULAR | Status: DC | PRN

## 2015-04-06 MED ORDER — EPHEDRINE 5 MG/ML INJ: 10.0000 mg | INTRAVENOUS | Status: DC | PRN

## 2015-04-06 NOTE — Progress Notes (Signed)
Wasted 34 mg fentanyl in sink. , unable to waste against pt in pxyis. Witnessed by Houston Siren, RN

## 2015-04-06 NOTE — Progress Notes (Signed)
34ml fentanyl wasted in sink.  Unable to document waste in pyxis.

## 2015-04-06 NOTE — Progress Notes (Signed)
Delivery of live viable female by Dr. Rana Snare, APGARS 9,9

## 2015-04-06 NOTE — Anesthesia Procedure Notes (Signed)
Epidural Patient location during procedure: OB Start time: 04/06/2015 2:06 PM  Staffing Anesthesiologist: Mal Amabile Performed by: anesthesiologist   Preanesthetic Checklist Completed: patient identified, site marked, surgical consent, pre-op evaluation, timeout performed, IV checked, risks and benefits discussed and monitors and equipment checked  Epidural Patient position: sitting Prep: site prepped and draped and DuraPrep Patient monitoring: continuous pulse ox and blood pressure Approach: midline Location: L3-L4 Injection technique: LOR air  Needle:  Needle type: Tuohy  Needle gauge: 17 G Needle length: 9 cm and 9 Needle insertion depth: 4 cm Catheter type: closed end flexible Catheter size: 19 Gauge Catheter at skin depth: 9 cm Test dose: negative and Other  Assessment Events: blood not aspirated, injection not painful, no injection resistance, negative IV test and no paresthesia  Additional Notes Patient identified. Risks and benefits discussed including failed block, incomplete  Pain control, post dural puncture headache, nerve damage, paralysis, blood pressure Changes, nausea, vomiting, reactions to medications-both toxic and allergic and post Partum back pain. All questions were answered. Patient expressed understanding and wished to proceed. Sterile technique was used throughout procedure. Epidural site was Dressed with sterile barrier dressing. No paresthesias, signs of intravascular injection Or signs of intrathecal spread were encountered.  Patient was more comfortable after the epidural was dosed. Please see RN's note for documentation of vital signs and FHR which are stable.

## 2015-04-06 NOTE — Anesthesia Preprocedure Evaluation (Addendum)
Anesthesia Evaluation  Patient identified by MRN, date of birth, ID band Patient awake    Reviewed: Allergy & Precautions, Patient's Chart, lab work & pertinent test results  History of Anesthesia Complications (+) PONV and history of anesthetic complications  Airway Mallampati: II  TM Distance: >3 FB Neck ROM: Full    Dental no notable dental hx. (+) Teeth Intact   Pulmonary former smoker,    Pulmonary exam normal breath sounds clear to auscultation       Cardiovascular negative cardio ROS Normal cardiovascular exam Rhythm:Regular Rate:Normal - Carotid Bruit    Neuro/Psych  Headaches, PSYCHIATRIC DISORDERS Anxiety Depression    GI/Hepatic negative GI ROS, Neg liver ROS,   Endo/Other  Obesity  Renal/GU negative Renal ROS  negative genitourinary   Musculoskeletal negative musculoskeletal ROS (+)   Abdominal (+) + obese,   Peds  Hematology   Anesthesia Other Findings   Reproductive/Obstetrics (+) Pregnancy                            Anesthesia Physical Anesthesia Plan  ASA: II  Anesthesia Plan: Epidural   Post-op Pain Management:    Induction:   Airway Management Planned: Natural Airway  Additional Equipment:   Intra-op Plan:   Post-operative Plan:   Informed Consent: I have reviewed the patients History and Physical, chart, labs and discussed the procedure including the risks, benefits and alternatives for the proposed anesthesia with the patient or authorized representative who has indicated his/her understanding and acceptance.     Plan Discussed with: Anesthesiologist  Anesthesia Plan Comments:         Anesthesia Quick Evaluation
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
## 2015-04-07 ENCOUNTER — Encounter (HOSPITAL_COMMUNITY): Payer: Self-pay | Admitting: Anesthesiology

## 2015-04-07 ENCOUNTER — Inpatient Hospital Stay (HOSPITAL_COMMUNITY): Payer: BLUE CROSS/BLUE SHIELD

## 2015-04-07 ENCOUNTER — Encounter (HOSPITAL_COMMUNITY)
Admission: AD | Disposition: A | Discharge status: Home / Self Care | Payer: Self-pay | Source: Ambulatory Visit | Attending: Obstetrics and Gynecology

## 2015-04-07 HISTORY — PX: TUBAL LIGATION: SHX77

## 2015-04-07 LAB — CBC
HEMATOCRIT: 35.6 % — AB (ref 36.0–46.0)
HEMOGLOBIN: 11.8 g/dL — AB (ref 12.0–15.0)
MCH: 28.6 pg (ref 26.0–34.0)
MCHC: 33.1 g/dL (ref 30.0–36.0)
MCV: 86.4 fL (ref 78.0–100.0)
Platelets: 166 10*3/uL (ref 150–400)
RBC: 4.12 MIL/uL (ref 3.87–5.11)
RDW: 14.6 % (ref 11.5–15.5)
WBC: 15.4 10*3/uL — ABNORMAL HIGH (ref 4.0–10.5)

## 2015-04-07 LAB — RPR: RPR Ser Ql: NONREACTIVE

## 2015-04-07 SURGERY — LIGATION, FALLOPIAN TUBE, POSTPARTUM
Anesthesia: Epidural | Site: Abdomen | Laterality: Bilateral

## 2015-04-07 MED ORDER — ONDANSETRON HCL 4 MG/2ML IJ SOLN
INTRAMUSCULAR | Status: AC
  Filled 2015-04-07: qty 2

## 2015-04-07 MED ORDER — LACTATED RINGERS IV SOLN: INTRAVENOUS | Status: DC

## 2015-04-07 MED ORDER — FENTANYL CITRATE (PF) 100 MCG/2ML IJ SOLN: 25.0000 ug | INTRAMUSCULAR | Status: DC | PRN

## 2015-04-07 MED ORDER — PROMETHAZINE HCL 25 MG/ML IJ SOLN: 6.2500 mg | INTRAMUSCULAR | Status: DC | PRN

## 2015-04-07 MED ORDER — METOCLOPRAMIDE HCL 10 MG PO TABS
10.0000 mg | ORAL_TABLET | Freq: Once | ORAL | Status: AC
  Administered 2015-04-07: 10 mg via ORAL
  Filled 2015-04-07: qty 1

## 2015-04-07 MED ORDER — LACTATED RINGERS IV SOLN
INTRAVENOUS | Status: DC
  Administered 2015-04-07: 05:00:00 via INTRAVENOUS

## 2015-04-07 MED ORDER — ONDANSETRON HCL 4 MG/2ML IJ SOLN
INTRAMUSCULAR | Status: DC | PRN
  Administered 2015-04-07: 4 mg via INTRAVENOUS

## 2015-04-07 MED ORDER — BUPIVACAINE HCL (PF) 0.25 % IJ SOLN
INTRAMUSCULAR | Status: DC | PRN
  Administered 2015-04-07: 10 mL

## 2015-04-07 MED ORDER — BUPIVACAINE HCL (PF) 0.25 % IJ SOLN
INTRAMUSCULAR | Status: AC
  Filled 2015-04-07: qty 30

## 2015-04-07 MED ORDER — MIDAZOLAM HCL 2 MG/2ML IJ SOLN
INTRAMUSCULAR | Status: DC | PRN
  Administered 2015-04-07: 2 mg via INTRAVENOUS

## 2015-04-07 MED ORDER — MEPERIDINE HCL 25 MG/ML IJ SOLN: 6.2500 mg | INTRAMUSCULAR | Status: DC | PRN

## 2015-04-07 MED ORDER — LANOLIN HYDROUS EX OINT: TOPICAL_OINTMENT | CUTANEOUS | Status: DC | PRN

## 2015-04-07 MED ORDER — SODIUM BICARBONATE 8.4 % IV SOLN
INTRAVENOUS | Status: DC | PRN
  Administered 2015-04-07: 3 mL via EPIDURAL
  Administered 2015-04-07: 5 mL via EPIDURAL
  Administered 2015-04-07: 7 mL via EPIDURAL
  Administered 2015-04-07: 5 mL via EPIDURAL

## 2015-04-07 MED ORDER — FAMOTIDINE 20 MG PO TABS
40.0000 mg | ORAL_TABLET | Freq: Once | ORAL | Status: AC
  Administered 2015-04-07: 40 mg via ORAL
  Filled 2015-04-07: qty 2

## 2015-04-07 MED ORDER — MIDAZOLAM HCL 2 MG/2ML IJ SOLN
INTRAMUSCULAR | Status: AC
  Filled 2015-04-07: qty 4

## 2015-04-07 MED ORDER — LACTATED RINGERS IV SOLN
INTRAVENOUS | Status: DC
  Administered 2015-04-07: 125 mL/h via INTRAVENOUS

## 2015-04-07 SURGICAL SUPPLY — 18 items
CLIP FILSHIE TUBAL LIGA STRL (Clip) ×3 IMPLANT
CLOTH BEACON ORANGE TIMEOUT ST (SAFETY) ×3 IMPLANT
CONTAINER PREFILL 10% NBF 15ML (MISCELLANEOUS) ×6 IMPLANT
DRSG OPSITE POSTOP 3X4 (GAUZE/BANDAGES/DRESSINGS) ×3 IMPLANT
GLOVE BIO SURGEON STRL SZ8 (GLOVE) ×3 IMPLANT
GLOVE SURG ORTHO 8.0 STRL STRW (GLOVE) ×6 IMPLANT
GOWN STRL REUS W/TWL LRG LVL3 (GOWN DISPOSABLE) ×6 IMPLANT
NEEDLE HYPO 22GX1.5 SAFETY (NEEDLE) ×3 IMPLANT
NS IRRIG 1000ML POUR BTL (IV SOLUTION) ×3 IMPLANT
PACK ABDOMINAL MINOR (CUSTOM PROCEDURE TRAY) ×3 IMPLANT
SPONGE LAP 4X18 X RAY DECT (DISPOSABLE) IMPLANT
SUT VIC AB 0 CT1 27 (SUTURE) ×3
SUT VIC AB 0 CT1 27XBRD ANBCTR (SUTURE) ×1 IMPLANT
SUT VICRYL RAPIDE 3 0 (SUTURE) ×3 IMPLANT
SYR CONTROL 10ML LL (SYRINGE) ×3 IMPLANT
TOWEL OR 17X24 6PK STRL BLUE (TOWEL DISPOSABLE) ×6 IMPLANT
TRAY FOLEY CATH SILVER 14FR (SET/KITS/TRAYS/PACK) ×3 IMPLANT
WATER STERILE IRR 1000ML POUR (IV SOLUTION) ×3 IMPLANT

## 2015-04-07 NOTE — Progress Notes (Signed)
Post Partum Day 1 Subjective: no complaints, up ad lib and voiding  Objective: Blood pressure 93/57, pulse 66, temperature 98 F (36.7 C), temperature source Oral, resp. rate 18, height  (1.753 m), weight 209 lb (94.802 kg), SpO2 98 %, unknown if currently breastfeeding.  Physical Exam:  General: alert, cooperative, appears stated age and no distress Lochia: appropriate Uterine Fundus: firm Incision: healing well DVT Evaluation: No evidence of DVT seen on physical exam.   Recent Labs  04/06/15 1111 04/07/15 0532  HGB 13.0 11.8*  HCT 38.3 35.6*    Assessment/Plan: Plan for discharge tomorrow, Breastfeeding and Circumcision prior to discharge  PPTL today   LOS: 1 day   , C 04/07/2015, 8:25 AM

## 2015-04-07 NOTE — Op Note (Signed)
Pre op:  Multiparity and desires sterility Post op:  Same Procedure:  Postpartum Bilateral Tubal ligation by Filshie Clip Surgeon:   Anesthesia:  Epidural Procedure:  After informed consent and sterile prep and drape, time out was carried out.  A 2 cm infraumbilical skin incision was made and taken down sharply to the fascia which was incised with mayo scissors and the peritoneum identified and opened sharply.  Army/Navy retractors placed.  The fallopian tubes were identified by their fimbriated ends and filshie clips were placed across the tubes at the midportion of each tube.  Good placement was noted bilaterally. The fascia was then closed with 0 vicryl.  The skin closed with 3-0 vicryl rapide in a subcuticular stitch with good approximation noted.  The incision was then infiltrated with .25% marcaine, 10cc.  The patient was stable on transfer to the recovery room.  EBL Minimal.  Sponge and instrument counts were normal.  No complications.  

## 2015-04-07 NOTE — Anesthesia Postprocedure Evaluation (Signed)
  Anesthesia Post-op Note  Patient: Alexis Lindsey  Procedure(s) Performed: Procedure(s) (LRB): POST PARTUM TUBAL LIGATION (Bilateral)  Patient Location: PACU  Anesthesia Type: Epidural  Level of Consciousness: awake and alert   Airway and Oxygen Therapy: Patient Spontanous Breathing  Post-op Pain: mild  Post-op Assessment: Post-op Vital signs reviewed, Patient's Cardiovascular Status Stable, Respiratory Function Stable, Patent Airway and No signs of Nausea or vomiting  Last Vitals:  Filed Vitals:   04/07/15 1830  BP: 109/65  Pulse: 64  Temp: 36.6 C  Resp: 18    Post-op Vital Signs: stable   Complications: No apparent anesthesia complications

## 2015-04-07 NOTE — Lactation Note (Signed)
This note was copied from the chart of Boy Aaralyn Kil. Lactation Consultation Note  Patient Name: Boy Wandalene Abrams [id number] Date: 04/07/2015 Reason for consult: Initial assessment Baby 25 hours old. Mom reports that she is not sure if baby latching well or not. Assisted mom to latch baby to right breast in football position. Baby latched deeply, suckling rhythmically with a few swallows noted. Mom states that she thought something must be wrong because "it wasn't hurting" when baby at breast. Enc mom to nurse with cues. Mom given [facility] brochure, aware of OP/BFSG, community resources, and [facility] phone line assistance after D/C.  Maternal Data Has patient been taught Hand Expression?: Yes Does the patient have breastfeeding experience prior to this delivery?: Yes  Feeding Feeding Type: Breast Fed Length of feed: 5 min  LATCH Score/Interventions Latch: Grasps breast easily, tongue down, lips flanged, rhythmical sucking.  Audible Swallowing: A few with stimulation  Type of Nipple: Everted at rest and after stimulation  Comfort (Breast/Nipple): Soft / non-tender     Hold (Positioning): No assistance needed to correctly position infant at breast. Intervention(s): Support Pillows  LATCH Score: 9  Lactation Tools Discussed/Used     Consult Status Consult Status: PRN    Geralynn Ochs 04/07/2015, 8:24 PM

## 2015-04-07 NOTE — Anesthesia Preprocedure Evaluation (Signed)
Anesthesia Evaluation  Patient identified by MRN, date of birth, ID band Patient awake    Reviewed: Allergy & Precautions, NPO status , Patient's Chart, lab work & pertinent test results  History of Anesthesia Complications (+) PONV  Airway Mallampati: II  TM Distance: >3 FB Neck ROM: Full    Dental no notable dental hx.    Pulmonary neg pulmonary ROS, former smoker,    Pulmonary exam normal breath sounds clear to auscultation       Cardiovascular negative cardio ROS Normal cardiovascular exam Rhythm:Regular Rate:Normal     Neuro/Psych negative neurological ROS  negative psych ROS   GI/Hepatic negative GI ROS, Neg liver ROS,   Endo/Other  negative endocrine ROS  Renal/GU negative Renal ROS  negative genitourinary   Musculoskeletal negative musculoskeletal ROS (+)   Abdominal   Peds negative pediatric ROS (+)  Hematology negative hematology ROS (+)   Anesthesia Other Findings   Reproductive/Obstetrics negative OB ROS                             Anesthesia Physical Anesthesia Plan  ASA: II  Anesthesia Plan: Epidural   Post-op Pain Management:    Induction:   Airway Management Planned: Natural Airway  Additional Equipment:   Intra-op Plan:   Post-operative Plan:   Informed Consent: I have reviewed the patients History and Physical, chart, labs and discussed the procedure including the risks, benefits and alternatives for the proposed anesthesia with the patient or authorized representative who has indicated his/her understanding and acceptance.   Dental advisory given  Plan Discussed with: CRNA  Anesthesia Plan Comments:         Anesthesia Quick Evaluation

## 2015-04-07 NOTE — Transfer of Care (Signed)
Immediate Anesthesia Transfer of Care Note  Patient: Alexis Lindsey  Procedure(s) Performed: Procedure(s): POST PARTUM TUBAL LIGATION (Bilateral)  Patient Location: PACU  Anesthesia Type:Epidural  Level of Consciousness: awake and alert   Airway & Oxygen Therapy: Patient Spontanous Breathing  Post-op Assessment: Report given to RN and Post -op Vital signs reviewed and stable  Post vital signs: Reviewed  Last Vitals:  Filed Vitals:   04/07/15 0927  BP:   Pulse: 77  Temp: 36.6 C  Resp:     Complications: No apparent anesthesia complications

## 2015-04-07 NOTE — Anesthesia Postprocedure Evaluation (Signed)
  Anesthesia Post-op Note  Patient: Alexis Lindsey  Procedure(s) Performed: Procedure(s): POST PARTUM TUBAL LIGATION (Bilateral)  Patient Location: Mother/Baby  Anesthesia Type:Epidural  Level of Consciousness: awake  Airway and Oxygen Therapy: Patient Spontanous Breathing  Post-op Pain: mild  Post-op Assessment: Patient's Cardiovascular Status Stable and Respiratory Function Stable LLE Motor Response: Purposeful movement LLE Sensation: Increased RLE Motor Response: Purposeful movement RLE Sensation: Increased      Post-op Vital Signs: stable  Last Vitals:  Filed Vitals:   04/07/15 1430  BP: 102/59  Pulse: 76  Temp: 36.5 C  Resp: 20    Complications: No apparent anesthesia complications

## 2015-04-08 ENCOUNTER — Inpatient Hospital Stay (HOSPITAL_COMMUNITY): Admission: RE | Admit: 2015-04-08 | Payer: BLUE CROSS/BLUE SHIELD | Source: Ambulatory Visit

## 2015-04-08 MED ORDER — OXYCODONE-ACETAMINOPHEN 5-325 MG PO TABS: 2.0000 | ORAL_TABLET | ORAL | Status: DC | PRN

## 2015-04-08 MED ORDER — IBUPROFEN 600 MG PO TABS: 600.0000 mg | ORAL_TABLET | Freq: Four times a day (QID) | ORAL | Status: DC

## 2015-04-08 NOTE — Clinical Social Work Maternal (Signed)
CLINICAL SOCIAL WORK MATERNAL/CHILD NOTE  Patient Details  Name: Alexis Lindsey MRN: [id number] Date of Birth: 01/26/1981  Date: 04/08/2015  Clinical Social Worker Initiating Note:  , LCSWDate/ Time Initiated: 04/08/15/0945   Child's Name: Chase Weyland   Legal Guardian:  (Parents)   Need for Interpreter: None   Date of Referral: 04/07/15   Reason for Referral: Other (Comment)   Referral Source: [facility]   Address: [address] 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 Met with mother who was pleasant and receptive to social work. Spouse wa also present and attentive to mother. Parents are married and have 6 other dependents ages 16, 12, 11, 9, 8 and 20 months. MOB reports hx of PP Depression after she had her second child. Informed that she is well aware of signs/symptoms and where to get help if needed. She denies any current symptoms of depression or anxiety. She also denies any history of illicit drug use. No acute social concerns noted or reported at this time. She reports having an excellent support system. Mother informed of social work availability.  CSW Plan/Description:    No further intervention required No barriers to  discharge   ,  J, LCSW 04/08/2015, 1:12 PM    CLINICAL SOCIAL WORK MATERNAL/CHILD NOTE  Patient Details  Name: Alexis Lindsey MRN: [id number] Date of Birth: 03/15/1981  Date:  04/08/2015  Clinical Social Worker Initiating Note:  Norlene Duel, LCSW Date/ Time Initiated:  04/08/15/0945     Child's Name:  Hilaria Ota   Legal Guardian:   (Parents)   Need for Interpreter:  None   Date of Referral:  04/07/15     Reason for Referral:  Other (Comment)   Referral Source:  [facility]   Address:  [address]  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   Met with mother who was pleasant and receptive to social work.  Spouse wa also present and attentive to mother.  Parents are married and have 46 other dependents ages 33, 43, 87, 77, 68 and 63 months.   MOB reports hx of PP Depression after she had her second child.   Informed that she is well aware of signs/symptoms and where to get help if needed.   She denies any current symptoms of depression or anxiety.  She also denies any history of illicit drug use.  No acute social concerns noted or reported at this time.   She reports having an excellent support system.   Mother informed of social work Fish farm manager.  CSW Plan/Description:     No further intervention required No barriers to discharge   ,   J, LCSW 04/08/2015, 1:12 PM

## 2015-04-08 NOTE — Discharge Summary (Signed)
Obstetric Discharge Summary Reason for Admission: induction of labor Prenatal Procedures: NST and ultrasound Intrapartum Procedures: spontaneous vaginal delivery Postpartum Procedures: P.P. tubal ligation Complications-Operative and Postpartum: 2nd degree perineal laceration HEMOGLOBIN  Date Value Ref Range Status  04/07/2015 11.8* 12.0 - 15.0 g/dL Final   HGB  Date Value Ref Range Status  08/06/2007 15.5 11.6 - 15.9 g/dL Final   HCT  Date Value Ref Range Status  04/07/2015 35.6* 36.0 - 46.0 % Final  08/06/2007 45.0 34.8 - 46.6 % Final    Physical Exam:  General: alert, cooperative, appears stated age and no distress Lochia: appropriate Uterine Fundus: firm Incision: healing well DVT Evaluation: No evidence of DVT seen on physical exam.  Discharge Diagnoses: Term Pregnancy-delivered  Discharge Information: Date: 04/08/2015 Activity: pelvic rest Diet: routine Medications: Ibuprofen and Percocet Condition: stable Instructions: refer to practice specific booklet Discharge to: home   Newborn Data: Live born female  Birth Weight: 7 lb 8 oz (3402 g) APGAR: 9, 9  Home with mother.  , C 04/08/2015, 9:14 AM

## 2015-04-09 ENCOUNTER — Encounter (HOSPITAL_COMMUNITY): Payer: Self-pay | Admitting: Obstetrics and Gynecology

## 2015-07-05 ENCOUNTER — Emergency Department (HOSPITAL_BASED_OUTPATIENT_CLINIC_OR_DEPARTMENT_OTHER)
Admission: EM | Admit: 2015-07-05 | Discharge: 2015-07-05 | Disposition: A | Discharge status: Home / Self Care | Payer: BLUE CROSS/BLUE SHIELD | Attending: Emergency Medicine | Admitting: Emergency Medicine

## 2015-07-05 ENCOUNTER — Emergency Department (HOSPITAL_BASED_OUTPATIENT_CLINIC_OR_DEPARTMENT_OTHER): Payer: BLUE CROSS/BLUE SHIELD

## 2015-07-05 ENCOUNTER — Encounter (HOSPITAL_BASED_OUTPATIENT_CLINIC_OR_DEPARTMENT_OTHER): Payer: Self-pay

## 2015-07-05 DIAGNOSIS — Y92481 Parking lot as the place of occurrence of the external cause: Secondary | ICD-10-CM | POA: Insufficient documentation

## 2015-07-05 DIAGNOSIS — S9001XA Contusion of right ankle, initial encounter: Secondary | ICD-10-CM | POA: Diagnosis not present

## 2015-07-05 DIAGNOSIS — S93401A Sprain of unspecified ligament of right ankle, initial encounter: Secondary | ICD-10-CM

## 2015-07-05 DIAGNOSIS — Y9389 Activity, other specified: Secondary | ICD-10-CM | POA: Insufficient documentation

## 2015-07-05 DIAGNOSIS — X58XXXA Exposure to other specified factors, initial encounter: Secondary | ICD-10-CM | POA: Insufficient documentation

## 2015-07-05 DIAGNOSIS — S99911A Unspecified injury of right ankle, initial encounter: Secondary | ICD-10-CM | POA: Diagnosis present

## 2015-07-05 DIAGNOSIS — Z8679 Personal history of other diseases of the circulatory system: Secondary | ICD-10-CM | POA: Diagnosis not present

## 2015-07-05 DIAGNOSIS — Z8659 Personal history of other mental and behavioral disorders: Secondary | ICD-10-CM | POA: Insufficient documentation

## 2015-07-05 DIAGNOSIS — Z87891 Personal history of nicotine dependence: Secondary | ICD-10-CM | POA: Diagnosis not present

## 2015-07-05 DIAGNOSIS — Y998 Other external cause status: Secondary | ICD-10-CM | POA: Insufficient documentation

## 2015-07-05 DIAGNOSIS — Z862 Personal history of diseases of the blood and blood-forming organs and certain disorders involving the immune mechanism: Secondary | ICD-10-CM | POA: Diagnosis not present

## 2015-07-05 DIAGNOSIS — Z8619 Personal history of other infectious and parasitic diseases: Secondary | ICD-10-CM | POA: Diagnosis not present

## 2015-07-05 MED ORDER — IBUPROFEN 800 MG PO TABS
800.0000 mg | ORAL_TABLET | Freq: Once | ORAL | Status: AC
  Administered 2015-07-05: 800 mg via ORAL

## 2015-07-05 MED ORDER — KETOROLAC TROMETHAMINE 30 MG/ML IJ SOLN
60.0000 mg | Freq: Once | INTRAMUSCULAR | Status: AC
  Administered 2015-07-05: 60 mg via INTRAMUSCULAR
  Filled 2015-07-05: qty 2

## 2015-07-05 MED ORDER — IBUPROFEN 800 MG PO TABS
ORAL_TABLET | ORAL | Status: AC
  Filled 2015-07-05: qty 1

## 2015-07-05 MED ORDER — OXYCODONE-ACETAMINOPHEN 5-325 MG PO TABS: 2.0000 | ORAL_TABLET | ORAL | Status: DC | PRN

## 2015-07-05 NOTE — ED Notes (Signed)
States still having a lot of pain after PO ibuprofen

## 2015-07-05 NOTE — ED Notes (Signed)
Pa  at bedside. 

## 2015-07-05 NOTE — Discharge Instructions (Signed)
Acute Ankle Sprain With Phase I Rehab An acute ankle sprain is a partial or complete tear in one or more of the ligaments of the ankle due to traumatic injury. The severity of the injury depends on both the number of ligaments sprained and the grade of sprain. There are 3 grades of sprains.   A grade 1 sprain is a mild sprain. There is a slight pull without obvious tearing. There is no loss of strength, and the muscle and ligament are the correct length.  A grade 2 sprain is a moderate sprain. There is tearing of fibers within the substance of the ligament where it connects two bones or two cartilages. The length of the ligament is increased, and there is usually decreased strength.  A grade 3 sprain is a complete rupture of the ligament and is uncommon. In addition to the grade of sprain, there are three types of ankle sprains.  Lateral ankle sprains: This is a sprain of one or more of the three ligaments on the outer side (lateral) of the ankle. These are the most common sprains. Medial ankle sprains: There is one large triangular ligament of the inner side (medial) of the ankle that is susceptible to injury. Medial ankle sprains are less common. Syndesmosis, "high ankle," sprains: The syndesmosis is the ligament that connects the two bones of the lower leg. Syndesmosis sprains usually only occur with very severe ankle sprains. SYMPTOMS  Pain, tenderness, and swelling in the ankle, starting at the side of injury that may progress to the whole ankle and foot with time.  "Pop" or tearing sensation at the time of injury.  Bruising that may spread to the heel.  Impaired ability to walk soon after injury. CAUSES   Acute ankle sprains are caused by trauma placed on the ankle that temporarily forces or pries the anklebone (talus) out of its normal socket.  Stretching or tearing of the ligaments that normally hold the joint in place (usually due to a twisting injury). RISK INCREASES  WITH:  Previous ankle sprain.  Sports in which the foot may land awkwardly (i.e., basketball, volleyball, or soccer) or walking or running on uneven or rough surfaces.  Shoes with inadequate support to prevent sideways motion when stress occurs.  Poor strength and flexibility.  Poor balance skills.  Contact sports. PREVENTION   Warm up and stretch properly before activity.  Maintain physical fitness:  Ankle and leg flexibility, muscle strength, and endurance.  Cardiovascular fitness.  Balance training activities.  Use proper technique and have a coach correct improper technique.  Taping, protective strapping, bracing, or high-top tennis shoes may help prevent injury. Initially, tape is best; however, it loses most of its support function within 10 to 15 minutes.  Wear proper-fitted protective shoes (High-top shoes with taping or bracing is more effective than either alone).  Provide the ankle with support during sports and practice activities for 12 months following injury. PROGNOSIS   If treated properly, ankle sprains can be expected to recover completely; however, the length of recovery depends on the degree of injury.  A grade 1 sprain usually heals enough in 5 to 7 days to allow modified activity and requires an average of 6 weeks to heal completely.  A grade 2 sprain requires 6 to 10 weeks to heal completely.  A grade 3 sprain requires 12 to 16 weeks to heal.  A syndesmosis sprain often takes more than 3 months to heal. RELATED COMPLICATIONS   Frequent recurrence of symptoms may   result in a chronic problem. Appropriately addressing the problem the first time decreases the frequency of recurrence and optimizes healing time. Severity of the initial sprain does not predict the likelihood of later instability. °· Injury to other structures (bone, cartilage, or tendon). °· A chronically unstable or arthritic ankle joint is a possibility with repeated  sprains. °TREATMENT °Treatment initially involves the use of ice, medication, and compression bandages to help reduce pain and inflammation. Ankle sprains are usually immobilized in a walking cast or boot to allow for healing. Crutches may be recommended to reduce pressure on the injury. After immobilization, strengthening and stretching exercises may be necessary to regain strength and a full range of motion. Surgery is rarely needed to treat ankle sprains. °MEDICATION  °· Nonsteroidal anti-inflammatory medications, such as aspirin and ibuprofen (do not take for the first 3 days after injury or within 7 days before surgery), or other minor pain relievers, such as acetaminophen, are often recommended. Take these as directed by your caregiver. Contact your caregiver immediately if any bleeding, stomach upset, or signs of an allergic reaction occur from these medications. °· Ointments applied to the skin may be helpful. °· Pain relievers may be prescribed as necessary by your caregiver. Do not take prescription pain medication for longer than 4 to 7 days. Use only as directed and only as much as you need. °HEAT AND COLD °· Cold treatment (icing) is used to relieve pain and reduce inflammation for acute and chronic cases. Cold should be applied for 10 to 15 minutes every 2 to 3 hours for inflammation and pain and immediately after any activity that aggravates your symptoms. Use ice packs or an ice massage. °· Heat treatment may be used before performing stretching and strengthening activities prescribed by your caregiver. Use a heat pack or a warm soak. °SEEK IMMEDIATE MEDICAL CARE IF:  °· Pain, swelling, or bruising worsens despite treatment. °· You experience pain, numbness, discoloration, or coldness in the foot or toes. °· New, unexplained symptoms develop (drugs used in treatment may produce side effects.) °EXERCISES  °PHASE I EXERCISES °RANGE OF MOTION (ROM) AND STRETCHING EXERCISES - Ankle Sprain, Acute Phase I,  Weeks 1 to 2 °These exercises may help you when beginning to restore flexibility in your ankle. You will likely work on these exercises for the 1 to 2 weeks after your injury. Once your physician, physical therapist, or athletic trainer sees adequate progress, he or she will advance your exercises. While completing these exercises, remember:  °· Restoring tissue flexibility helps normal motion to return to the joints. This allows healthier, less painful movement and activity. °· An effective stretch should be held for at least 30 seconds. °· A stretch should never be painful. You should only feel a gentle lengthening or release in the stretched tissue. °RANGE OF MOTION - Dorsi/Plantar Flexion °· While sitting with your right / left knee straight, draw the top of your foot upwards by flexing your ankle. Then reverse the motion, pointing your toes downward. °· Hold each position for __________ seconds. °· After completing your first set of exercises, repeat this exercise with your knee bent. °Repeat __________ times. Complete this exercise __________ times per day.  °RANGE OF MOTION - Ankle Alphabet °· Imagine your right / left big toe is a pen. °· Keeping your hip and knee still, write out the entire alphabet with your "pen." Make the letters as large as you can without increasing any discomfort. °Repeat __________ times. Complete this exercise __________   times per day.  °STRENGTHENING EXERCISES - Ankle Sprain, Acute -Phase I, Weeks 1 to 2 °These exercises may help you when beginning to restore strength in your ankle. You will likely work on these exercises for 1 to 2 weeks after your injury. Once your physician, physical therapist, or athletic trainer sees adequate progress, he or she will advance your exercises. While completing these exercises, remember:  °· Muscles can gain both the endurance and the strength needed for everyday activities through controlled exercises. °· Complete these exercises as instructed by  your physician, physical therapist, or athletic trainer. Progress the resistance and repetitions only as guided. °· You may experience muscle soreness or fatigue, but the pain or discomfort you are trying to eliminate should never worsen during these exercises. If this pain does worsen, stop and make certain you are following the directions exactly. If the pain is still present after adjustments, discontinue the exercise until you can discuss the trouble with your clinician. °STRENGTH - Dorsiflexors °· Secure a rubber exercise band/tubing to a fixed object (i.e., table, pole) and loop the other end around your right / left foot. °· Sit on the floor facing the fixed object. The band/tubing should be slightly tense when your foot is relaxed. °· Slowly draw your foot back toward you using your ankle and toes. °· Hold this position for __________ seconds. Slowly release the tension in the band and return your foot to the starting position. °Repeat __________ times. Complete this exercise __________ times per day.  °STRENGTH - Plantar-flexors  °· Sit with your right / left leg extended. Holding onto both ends of a rubber exercise band/tubing, loop it around the ball of your foot. Keep a slight tension in the band. °· Slowly push your toes away from you, pointing them downward. °· Hold this position for __________ seconds. Return slowly, controlling the tension in the band/tubing. °Repeat __________ times. Complete this exercise __________ times per day.  °STRENGTH - Ankle Eversion °· Secure one end of a rubber exercise band/tubing to a fixed object (table, pole). Loop the other end around your foot just before your toes. °· Place your fists between your knees. This will focus your strengthening at your ankle. °· Drawing the band/tubing across your opposite foot, slowly, pull your little toe out and up. Make sure the band/tubing is positioned to resist the entire motion. °· Hold this position for __________ seconds. °Have  your muscles resist the band/tubing as it slowly pulls your foot back to the starting position.  °Repeat __________ times. Complete this exercise __________ times per day.  °STRENGTH - Ankle Inversion °· Secure one end of a rubber exercise band/tubing to a fixed object (table, pole). Loop the other end around your foot just before your toes. °· Place your fists between your knees. This will focus your strengthening at your ankle. °· Slowly, pull your big toe up and in, making sure the band/tubing is positioned to resist the entire motion. °· Hold this position for __________ seconds. °· Have your muscles resist the band/tubing as it slowly pulls your foot back to the starting position. °Repeat __________ times. Complete this exercises __________ times per day.  °STRENGTH - Towel Curls °· Sit in a chair positioned on a non-carpeted surface. °· Place your right / left foot on a towel, keeping your heel on the floor. °· Pull the towel toward your heel by only curling your toes. Keep your heel on the floor. °· If instructed by your physician, physical therapist,   or athletic trainer, add weight to the end of the towel. Repeat __________ times. Complete this exercise __________ times per day.   This information is not intended to replace advice given to you by your health care provider. Make sure you discuss any questions you have with your health care provider.   Document Released: 02/12/2005 Document Revised: 08/04/2014 Document Reviewed: 10/26/2008 [employer] Interactive Patient Education 2016 [employer]  Cryotherapy Cryotherapy is when you put ice on your injury. Ice helps lessen pain and puffiness (swelling) after an injury. Ice works the best when you start using it in the first 24 to 48 hours after an injury. HOME CARE  Put a dry or damp towel between the ice pack and your skin.  You may press gently on the ice pack.  Leave the ice on for no more than 10 to 20 minutes at a time.  Check your skin  after 5 minutes to make sure your skin is okay.  Rest at least 20 minutes between ice pack uses.  Stop using ice when your skin loses feeling (numbness).  Do not use ice on someone who cannot tell you when it hurts. This includes small children and people with memory problems (dementia). GET HELP RIGHT AWAY IF:  You have white spots on your skin.  Your skin turns blue or pale.  Your skin feels waxy or hard.  Your puffiness gets worse. MAKE SURE YOU:   Understand these instructions.  Will watch your condition.  Will get help right away if you are not doing well or get worse.   This information is not intended to replace advice given to you by your health care provider. Make sure you discuss any questions you have with your health care provider.   Follow up with your PCP if symptoms do not improve. Apply ice to affected area. Keep leg elevated as much as possible. Return to the emergency department if you experience worsening of your symptoms, discoloration of your extremity, pale or cold extremity, numbness or tingling.

## 2015-07-05 NOTE — ED Notes (Signed)
Complains of right ankle pain after rolling ankle in parking lot.
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
## 2016-05-04 ENCOUNTER — Other Ambulatory Visit: Payer: Self-pay | Admitting: Advanced Practice Midwife

## 2016-05-04 MED ORDER — AZITHROMYCIN 250 MG PO TABS: ORAL_TABLET | ORAL | 0 refills | Status: DC

## 2016-08-05 ENCOUNTER — Encounter (HOSPITAL_BASED_OUTPATIENT_CLINIC_OR_DEPARTMENT_OTHER): Payer: Self-pay | Admitting: Respiratory Therapy

## 2016-08-05 ENCOUNTER — Emergency Department (HOSPITAL_BASED_OUTPATIENT_CLINIC_OR_DEPARTMENT_OTHER): Payer: BLUE CROSS/BLUE SHIELD

## 2016-08-05 ENCOUNTER — Inpatient Hospital Stay (HOSPITAL_BASED_OUTPATIENT_CLINIC_OR_DEPARTMENT_OTHER)
Admission: EM | Admit: 2016-08-05 | Discharge: 2016-08-07 | DRG: 195 | Disposition: A | Discharge status: Home / Self Care | Payer: BLUE CROSS/BLUE SHIELD | Attending: Internal Medicine | Admitting: Internal Medicine

## 2016-08-05 DIAGNOSIS — Z792 Long term (current) use of antibiotics: Secondary | ICD-10-CM

## 2016-08-05 DIAGNOSIS — Z803 Family history of malignant neoplasm of breast: Secondary | ICD-10-CM

## 2016-08-05 DIAGNOSIS — F329 Major depressive disorder, single episode, unspecified: Secondary | ICD-10-CM | POA: Diagnosis not present

## 2016-08-05 DIAGNOSIS — D759 Disease of blood and blood-forming organs, unspecified: Secondary | ICD-10-CM

## 2016-08-05 DIAGNOSIS — J189 Pneumonia, unspecified organism: Principal | ICD-10-CM | POA: Diagnosis present

## 2016-08-05 DIAGNOSIS — R05 Cough: Secondary | ICD-10-CM | POA: Diagnosis not present

## 2016-08-05 DIAGNOSIS — Z91410 Personal history of adult physical and sexual abuse: Secondary | ICD-10-CM

## 2016-08-05 DIAGNOSIS — F32A Depression, unspecified: Secondary | ICD-10-CM

## 2016-08-05 DIAGNOSIS — F419 Anxiety disorder, unspecified: Secondary | ICD-10-CM | POA: Diagnosis not present

## 2016-08-05 DIAGNOSIS — G4709 Other insomnia: Secondary | ICD-10-CM

## 2016-08-05 DIAGNOSIS — Z881 Allergy status to other antibiotic agents status: Secondary | ICD-10-CM

## 2016-08-05 DIAGNOSIS — Z87891 Personal history of nicotine dependence: Secondary | ICD-10-CM

## 2016-08-05 DIAGNOSIS — Z8249 Family history of ischemic heart disease and other diseases of the circulatory system: Secondary | ICD-10-CM

## 2016-08-05 DIAGNOSIS — Z885 Allergy status to narcotic agent status: Secondary | ICD-10-CM

## 2016-08-05 DIAGNOSIS — G4700 Insomnia, unspecified: Secondary | ICD-10-CM | POA: Diagnosis present

## 2016-08-05 DIAGNOSIS — Z833 Family history of diabetes mellitus: Secondary | ICD-10-CM

## 2016-08-05 DIAGNOSIS — Z853 Personal history of malignant neoplasm of breast: Secondary | ICD-10-CM

## 2016-08-05 DIAGNOSIS — Z8579 Personal history of other malignant neoplasms of lymphoid, hematopoietic and related tissues: Secondary | ICD-10-CM

## 2016-08-05 LAB — URINALYSIS, ROUTINE W REFLEX MICROSCOPIC
Bilirubin Urine: NEGATIVE
GLUCOSE, UA: NEGATIVE mg/dL
Hgb urine dipstick: NEGATIVE
Ketones, ur: NEGATIVE mg/dL
Leukocytes, UA: NEGATIVE
Nitrite: NEGATIVE
PH: 6 (ref 5.0–8.0)
PROTEIN: NEGATIVE mg/dL
Specific Gravity, Urine: >1.046 — ABNORMAL HIGH (ref 1.005–1.030)

## 2016-08-05 LAB — BASIC METABOLIC PANEL
Anion gap: 9 (ref 5–15)
BUN: 12 mg/dL (ref 6–20)
CO2: 21 mmol/L — AB (ref 22–32)
Calcium: 9.1 mg/dL (ref 8.9–10.3)
Chloride: 106 mmol/L (ref 101–111)
Creatinine, Ser: 0.88 mg/dL (ref 0.44–1.00)
GFR calc Af Amer: >60 mL/min (ref >60)
GLUCOSE: 100 mg/dL — AB (ref 65–99)
POTASSIUM: 3.7 mmol/L (ref 3.5–5.1)
Sodium: 136 mmol/L (ref 135–145)

## 2016-08-05 LAB — CBC WITH DIFFERENTIAL/PLATELET
Basophils Absolute: 0 10*3/uL (ref 0.0–0.1)
Basophils Relative: 0 %
EOS PCT: 7 %
Eosinophils Absolute: 0.6 10*3/uL (ref 0.0–0.7)
HEMATOCRIT: 39.2 % (ref 36.0–46.0)
Hemoglobin: 13.2 g/dL (ref 12.0–15.0)
LYMPHS ABS: 2.1 10*3/uL (ref 0.7–4.0)
Lymphocytes Relative: 26 %
MCH: 28.4 pg (ref 26.0–34.0)
MCHC: 33.7 g/dL (ref 30.0–36.0)
MCV: 84.5 fL (ref 78.0–100.0)
MONO ABS: 0.6 10*3/uL (ref 0.1–1.0)
Monocytes Relative: 8 %
NEUTROS ABS: 4.5 10*3/uL (ref 1.7–7.7)
Neutrophils Relative %: 59 %
PLATELETS: 197 10*3/uL (ref 150–400)
RBC: 4.64 MIL/uL (ref 3.87–5.11)
RDW: 12.8 % (ref 11.5–15.5)
WBC: 7.8 10*3/uL (ref 4.0–10.5)

## 2016-08-05 LAB — INFLUENZA PANEL BY PCR (TYPE A & B)
INFLAPCR: NEGATIVE
Influenza B By PCR: NEGATIVE

## 2016-08-05 LAB — TROPONIN I: Troponin I: <0.03 ng/mL (ref <0.03)

## 2016-08-05 LAB — STREP PNEUMONIAE URINARY ANTIGEN: STREP PNEUMO URINARY ANTIGEN: NEGATIVE

## 2016-08-05 LAB — D-DIMER, QUANTITATIVE: D-Dimer, Quant: 0.54 ug/mL-FEU — ABNORMAL HIGH (ref 0.00–0.50)

## 2016-08-05 LAB — HCG, QUANTITATIVE, PREGNANCY: hCG, Beta Chain, Quant, S: <1 m[IU]/mL (ref <5)

## 2016-08-05 MED ORDER — IBUPROFEN 200 MG PO TABS: 800.0000 mg | ORAL_TABLET | Freq: Four times a day (QID) | ORAL | Status: DC | PRN

## 2016-08-05 MED ORDER — LEVOFLOXACIN IN D5W 750 MG/150ML IV SOLN
750.0000 mg | INTRAVENOUS | Status: DC
Start: 2016-08-06 — End: 2016-08-07
  Administered 2016-08-06 – 2016-08-07 (×2): 750 mg via INTRAVENOUS
  Filled 2016-08-05 (×2): qty 150

## 2016-08-05 MED ORDER — ZOLPIDEM TARTRATE 5 MG PO TABS
5.0000 mg | ORAL_TABLET | Freq: Every evening | ORAL | Status: DC | PRN
  Administered 2016-08-05 – 2016-08-06 (×2): 5 mg via ORAL
  Filled 2016-08-05 (×2): qty 1

## 2016-08-05 MED ORDER — VENLAFAXINE HCL ER 75 MG PO CP24
150.0000 mg | ORAL_CAPSULE | Freq: Every day | ORAL | Status: DC
  Filled 2016-08-05: qty 2

## 2016-08-05 MED ORDER — DIPHENHYDRAMINE HCL 50 MG/ML IJ SOLN
50.0000 mg | Freq: Once | INTRAMUSCULAR | Status: AC
  Administered 2016-08-05: 50 mg via INTRAVENOUS
  Filled 2016-08-05: qty 1

## 2016-08-05 MED ORDER — BENZONATATE 100 MG PO CAPS
200.0000 mg | ORAL_CAPSULE | Freq: Three times a day (TID) | ORAL | Status: DC | PRN
  Administered 2016-08-05 – 2016-08-06 (×3): 200 mg via ORAL
  Filled 2016-08-05 (×5): qty 2

## 2016-08-05 MED ORDER — CLONAZEPAM 0.5 MG PO TABS
0.5000 mg | ORAL_TABLET | Freq: Two times a day (BID) | ORAL | Status: DC | PRN
  Administered 2016-08-05 – 2016-08-06 (×3): 0.5 mg via ORAL
  Filled 2016-08-05 (×3): qty 1

## 2016-08-05 MED ORDER — GUAIFENESIN ER 600 MG PO TB12
600.0000 mg | ORAL_TABLET | Freq: Two times a day (BID) | ORAL | Status: DC
  Administered 2016-08-05 (×2): 600 mg via ORAL
  Filled 2016-08-05 (×2): qty 1

## 2016-08-05 MED ORDER — OXYCODONE-ACETAMINOPHEN 5-325 MG PO TABS
1.0000 | ORAL_TABLET | ORAL | Status: DC | PRN
  Administered 2016-08-05 – 2016-08-07 (×6): 2 via ORAL
  Filled 2016-08-05 (×6): qty 2

## 2016-08-05 MED ORDER — ENOXAPARIN SODIUM 40 MG/0.4ML ~~LOC~~ SOLN
40.0000 mg | SUBCUTANEOUS | Status: DC
  Administered 2016-08-05 – 2016-08-07 (×3): 40 mg via SUBCUTANEOUS
  Filled 2016-08-05 (×3): qty 0.4

## 2016-08-05 MED ORDER — SODIUM CHLORIDE 0.9 % IV BOLUS (SEPSIS)
1000.0000 mL | Freq: Once | INTRAVENOUS | Status: AC
  Administered 2016-08-05: 1000 mL via INTRAVENOUS

## 2016-08-05 MED ORDER — IOPAMIDOL (ISOVUE-370) INJECTION 76%
100.0000 mL | Freq: Once | INTRAVENOUS | Status: AC | PRN
  Administered 2016-08-05: 100 mL via INTRAVENOUS

## 2016-08-05 MED ORDER — FENTANYL CITRATE (PF) 100 MCG/2ML IJ SOLN
50.0000 ug | Freq: Once | INTRAMUSCULAR | Status: AC
  Administered 2016-08-05: 50 ug via INTRAVENOUS
  Filled 2016-08-05: qty 2

## 2016-08-05 MED ORDER — METOCLOPRAMIDE HCL 5 MG/ML IJ SOLN
10.0000 mg | Freq: Once | INTRAMUSCULAR | Status: AC
  Administered 2016-08-05: 10 mg via INTRAVENOUS
  Filled 2016-08-05: qty 2

## 2016-08-05 MED ORDER — FENTANYL CITRATE (PF) 100 MCG/2ML IJ SOLN
12.5000 ug | Freq: Once | INTRAMUSCULAR | Status: AC
  Administered 2016-08-05: 12.5 ug via INTRAVENOUS
  Filled 2016-08-05: qty 2

## 2016-08-05 MED ORDER — KETOROLAC TROMETHAMINE 30 MG/ML IJ SOLN
30.0000 mg | Freq: Once | INTRAMUSCULAR | Status: AC
  Administered 2016-08-05: 30 mg via INTRAVENOUS
  Filled 2016-08-05: qty 1

## 2016-08-05 MED ORDER — LEVOFLOXACIN IN D5W 750 MG/150ML IV SOLN: 750.0000 mg | INTRAVENOUS | Status: DC

## 2016-08-05 MED ORDER — KETOROLAC TROMETHAMINE 30 MG/ML IJ SOLN
30.0000 mg | Freq: Three times a day (TID) | INTRAMUSCULAR | Status: DC | PRN
  Administered 2016-08-05 – 2016-08-06 (×2): 30 mg via INTRAVENOUS
  Filled 2016-08-05 (×2): qty 1

## 2016-08-05 MED ORDER — BENZONATATE 100 MG PO CAPS
200.0000 mg | ORAL_CAPSULE | Freq: Once | ORAL | Status: AC
  Administered 2016-08-05: 200 mg via ORAL
  Filled 2016-08-05: qty 2

## 2016-08-05 MED ORDER — LEVOFLOXACIN IN D5W 750 MG/150ML IV SOLN
750.0000 mg | Freq: Once | INTRAVENOUS | Status: AC
  Administered 2016-08-05: 750 mg via INTRAVENOUS
  Filled 2016-08-05: qty 150

## 2016-08-05 NOTE — ED Notes (Signed)
Pt is coughing less after meds given

## 2016-08-05 NOTE — Care Management Note (Signed)
Case Management Note  Patient Details  Name: Forest Beckerlizabeth F Hubka MRN: [id number] Date of Birth: 1981-06-03  Subjective/Objective:                  Patient presented from home with Shortness of breath and chest pain.  CM will follow for discharge needs pending patient's progress and physician orders.   Action/Plan:   Expected Discharge Date:                  Expected Discharge Plan:     In-House Referral:     Discharge planning Services     Post Acute Care Choice:    Choice offered to:     DME Arranged:    DME Agency:     HH Arranged:    HH Agency:     Status of Service:     If discussed at [employer] Length of Stay Meetings, dates discussed:    Additional Comments:  Anda KraftRobarge,  C, RN 08/05/2016, 10:12 AM
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
## 2016-08-05 NOTE — ED Triage Notes (Signed)
Pt c/o nonproductive cough since Friday, nasal and chest congestion; c/o difficulty breathing

## 2016-08-05 NOTE — Progress Notes (Signed)
Levaquin 750 mg IV x 1 was given on 08/05/2016 at 0527. Rescheduled daily dosing to start 08/06/2016 at 0600. CrCl > 50, q 24 hour dosing appropriate.  Thank you for allowing us to participate in this patients care. Signe Coltonya C , PharmD Pager: [phone]

## 2016-08-05 NOTE — ED Notes (Signed)
C/o cough, congestion, body aches, runny nose and chest pain onset friday
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
## 2016-08-06 ENCOUNTER — Observation Stay (HOSPITAL_COMMUNITY): Payer: BLUE CROSS/BLUE SHIELD

## 2016-08-06 DIAGNOSIS — F419 Anxiety disorder, unspecified: Secondary | ICD-10-CM | POA: Diagnosis not present

## 2016-08-06 DIAGNOSIS — Z881 Allergy status to other antibiotic agents status: Secondary | ICD-10-CM | POA: Diagnosis not present

## 2016-08-06 DIAGNOSIS — Z833 Family history of diabetes mellitus: Secondary | ICD-10-CM | POA: Diagnosis not present

## 2016-08-06 DIAGNOSIS — Z853 Personal history of malignant neoplasm of breast: Secondary | ICD-10-CM | POA: Diagnosis not present

## 2016-08-06 DIAGNOSIS — Z885 Allergy status to narcotic agent status: Secondary | ICD-10-CM | POA: Diagnosis not present

## 2016-08-06 DIAGNOSIS — D759 Disease of blood and blood-forming organs, unspecified: Secondary | ICD-10-CM

## 2016-08-06 DIAGNOSIS — Z8579 Personal history of other malignant neoplasms of lymphoid, hematopoietic and related tissues: Secondary | ICD-10-CM | POA: Diagnosis not present

## 2016-08-06 DIAGNOSIS — R05 Cough: Secondary | ICD-10-CM | POA: Diagnosis present

## 2016-08-06 DIAGNOSIS — Z792 Long term (current) use of antibiotics: Secondary | ICD-10-CM | POA: Diagnosis not present

## 2016-08-06 DIAGNOSIS — Z8249 Family history of ischemic heart disease and other diseases of the circulatory system: Secondary | ICD-10-CM | POA: Diagnosis not present

## 2016-08-06 DIAGNOSIS — F329 Major depressive disorder, single episode, unspecified: Secondary | ICD-10-CM | POA: Diagnosis not present

## 2016-08-06 DIAGNOSIS — Z87891 Personal history of nicotine dependence: Secondary | ICD-10-CM | POA: Diagnosis not present

## 2016-08-06 DIAGNOSIS — Z803 Family history of malignant neoplasm of breast: Secondary | ICD-10-CM | POA: Diagnosis not present

## 2016-08-06 DIAGNOSIS — J189 Pneumonia, unspecified organism: Secondary | ICD-10-CM | POA: Diagnosis not present

## 2016-08-06 DIAGNOSIS — Z91410 Personal history of adult physical and sexual abuse: Secondary | ICD-10-CM | POA: Diagnosis not present

## 2016-08-06 DIAGNOSIS — G4700 Insomnia, unspecified: Secondary | ICD-10-CM | POA: Diagnosis present

## 2016-08-06 LAB — COMPREHENSIVE METABOLIC PANEL
ALBUMIN: 3.1 g/dL — AB (ref 3.5–5.0)
ALK PHOS: 53 U/L (ref 38–126)
ALT: 30 U/L (ref 14–54)
ANION GAP: 5 (ref 5–15)
AST: 23 U/L (ref 15–41)
BILIRUBIN TOTAL: 0.2 mg/dL — AB (ref 0.3–1.2)
BUN: 15 mg/dL (ref 6–20)
CALCIUM: 8.6 mg/dL — AB (ref 8.9–10.3)
CO2: 24 mmol/L (ref 22–32)
CREATININE: 0.88 mg/dL (ref 0.44–1.00)
Chloride: 106 mmol/L (ref 101–111)
GFR calc Af Amer: >60 mL/min (ref >60)
GFR calc non Af Amer: >60 mL/min (ref >60)
GLUCOSE: 92 mg/dL (ref 65–99)
Potassium: 4.1 mmol/L (ref 3.5–5.1)
Sodium: 135 mmol/L (ref 135–145)
TOTAL PROTEIN: 5.6 g/dL — AB (ref 6.5–8.1)

## 2016-08-06 LAB — CBC
HCT: 36.7 % (ref 36.0–46.0)
HEMOGLOBIN: 12.1 g/dL (ref 12.0–15.0)
MCH: 28.6 pg (ref 26.0–34.0)
MCHC: 33 g/dL (ref 30.0–36.0)
MCV: 86.8 fL (ref 78.0–100.0)
PLATELETS: 153 10*3/uL (ref 150–400)
RBC: 4.23 MIL/uL (ref 3.87–5.11)
RDW: 13.6 % (ref 11.5–15.5)
WBC: 4.7 10*3/uL (ref 4.0–10.5)

## 2016-08-06 LAB — BLOOD CULTURE ID PANEL (REFLEXED)
ACINETOBACTER BAUMANNII: NOT DETECTED
Candida albicans: NOT DETECTED
Candida glabrata: NOT DETECTED
Candida krusei: NOT DETECTED
Candida parapsilosis: NOT DETECTED
Candida tropicalis: NOT DETECTED
ESCHERICHIA COLI: NOT DETECTED
Enterobacter cloacae complex: NOT DETECTED
Enterobacteriaceae species: NOT DETECTED
Enterococcus species: NOT DETECTED
HAEMOPHILUS INFLUENZAE: NOT DETECTED
Klebsiella oxytoca: NOT DETECTED
Klebsiella pneumoniae: NOT DETECTED
LISTERIA MONOCYTOGENES: NOT DETECTED
Methicillin resistance: DETECTED — AB
NEISSERIA MENINGITIDIS: NOT DETECTED
PSEUDOMONAS AERUGINOSA: NOT DETECTED
Proteus species: NOT DETECTED
SERRATIA MARCESCENS: NOT DETECTED
STAPHYLOCOCCUS AUREUS BCID: NOT DETECTED
STREPTOCOCCUS AGALACTIAE: NOT DETECTED
STREPTOCOCCUS PNEUMONIAE: NOT DETECTED
STREPTOCOCCUS SPECIES: NOT DETECTED
Staphylococcus species: DETECTED — AB
Streptococcus pyogenes: NOT DETECTED

## 2016-08-06 LAB — LEGIONELLA PNEUMOPHILA SEROGP 1 UR AG: L. pneumophila Serogp 1 Ur Ag: NEGATIVE

## 2016-08-06 MED ORDER — ONDANSETRON HCL 4 MG/2ML IJ SOLN
4.0000 mg | Freq: Four times a day (QID) | INTRAMUSCULAR | Status: DC | PRN
  Administered 2016-08-06 – 2016-08-07 (×3): 4 mg via INTRAVENOUS
  Filled 2016-08-06 (×3): qty 2

## 2016-08-06 MED ORDER — DESVENLAFAXINE SUCCINATE ER 100 MG PO TB24
100.0000 mg | ORAL_TABLET | Freq: Every day | ORAL | Status: DC
  Administered 2016-08-06 – 2016-08-07 (×2): 100 mg via ORAL
  Filled 2016-08-06 (×2): qty 1

## 2016-08-06 MED ORDER — GUAIFENESIN ER 600 MG PO TB12
1200.0000 mg | ORAL_TABLET | Freq: Two times a day (BID) | ORAL | Status: DC
  Administered 2016-08-06 – 2016-08-07 (×3): 1200 mg via ORAL
  Filled 2016-08-06 (×3): qty 2

## 2016-08-06 MED ORDER — SODIUM CHLORIDE 0.9 % IV SOLN
INTRAVENOUS | Status: AC
  Administered 2016-08-06 – 2016-08-07 (×2): via INTRAVENOUS

## 2016-08-06 MED ORDER — BENZONATATE 100 MG PO CAPS
200.0000 mg | ORAL_CAPSULE | Freq: Three times a day (TID) | ORAL | Status: DC
  Administered 2016-08-06 – 2016-08-07 (×5): 200 mg via ORAL
  Filled 2016-08-06 (×6): qty 2

## 2016-08-06 NOTE — Progress Notes (Signed)
PROGRESS NOTE    Alexis Lindsey  YNW:[id number] DOB: 10-04-1980 DOA: 08/05/2016 PCP: Leanor Rubenstein, MD   Outpatient Specialists:     Brief Narrative:  Alexis Lindsey is a 36 y.o. female  with a history of anxiety, ANA positive, history of migraines, depression  presenting to medical Center at [facility], with progressive shortness of breath since last Friday, non productive cough, low grade fever, and central chest pain with cough and deep inspiration. She does not recall any sick contacts. She denies any headaches, nausea or vomiting. She denies any abdominal pain or diarrhea. She denies any lower extremity swelling. She denies any recent long distance trips. She has not been recently treated for any other respiratory infections. No recent surgeries. Does not smoke    Assessment & Plan:   Active Problems:   Multifocal pneumonia   Anxiety   Blood dyscrasia   Depression   Other insomnia   multifocal PNA Blood and sputum cultures- 1/4 BC + as a contaminant Tessalon pearles Mucinex  -pain control  Anxiety/ Depression Continue home meds  Blood Dyscrasia, with +ANA, no acute event CT angio negative for PE  No intervention indicated at this time    DVT prophylaxis:  Lovenox   Code Status: Full Code   Family Communication: No family at bedside  Disposition Plan:  Home in AM   Consultants:        Subjective: Patient still with cough and significant pain in chest  Objective: Vitals:   08/05/16 2126 08/06/16 0215 08/06/16 0619 08/06/16 0937  BP: (!) 92/56 102/62 (!) 94/53 105/76  Pulse: 63 83 78 72  Resp: 16 20 16 20   Temp: 97.9 F (36.6 C) 98.2 F (36.8 C) 98.1 F (36.7 C) 97.5 F (36.4 C)  TempSrc: Oral Oral Oral Oral  SpO2: 99% 97% 98% 96%  Weight:      Height:        Intake/Output Summary (Last 24 hours) at 08/06/16 1055 Last data filed at 08/06/16 0619  Gross per 24 hour  Intake              710 ml  Output                0 ml    Net              710 ml   Filed Weights   08/05/16 1000  Weight: 65.8 kg (145 lb)    Examination:  General exam: tearful Respiratory system: No wheezing, few rales. Respiratory effort normal. Cardiovascular system: S1 & S2 heard, RRR. No JVD, murmurs, rubs, gallops or clicks. No pedal edema. Gastrointestinal system: Abdomen is nondistended, soft and nontender. No organomegaly or masses felt. Normal bowel sounds heard. Central nervous system: Alert and oriented. No focal neurological deficits.     Data Reviewed: I have personally reviewed following labs and imaging studies  CBC:  Recent Labs Lab 08/05/16 0320 08/06/16 0624  WBC 7.8 4.7  NEUTROABS 4.5  --   HGB 13.2 12.1  HCT 39.2 36.7  MCV 84.5 86.8  PLT 197 153   Basic Metabolic Panel:  Recent Labs Lab 08/05/16 0320 08/06/16 0624  NA 136 135  K 3.7 4.1  CL 106 106  CO2 21* 24  GLUCOSE 100* 92  BUN 12 15  CREATININE 0.88 0.88  CALCIUM 9.1 8.6*   GFR: Estimated Creatinine Clearance: 90.9 mL/min (by C-G formula based on SCr of 0.88 mg/dL). Liver Function Tests:  Recent Labs Lab  08/06/16 0624  AST 23  ALT 30  ALKPHOS 53  BILITOT 0.2*  PROT 5.6*  ALBUMIN 3.1*   No results for input(s): LIPASE, AMYLASE in the last 168 hours. No results for input(s): AMMONIA in the last 168 hours. Coagulation Profile: No results for input(s): INR, PROTIME in the last 168 hours. Cardiac Enzymes:  Recent Labs Lab 08/05/16 0320  TROPONINI <0.03   BNP (last 3 results) No results for input(s): PROBNP in the last 8760 hours. HbA1C: No results for input(s): HGBA1C in the last 72 hours. CBG: No results for input(s): GLUCAP in the last 168 hours. Lipid Profile: No results for input(s): CHOL, HDL, LDLCALC, TRIG, CHOLHDL, LDLDIRECT in the last 72 hours. Thyroid Function Tests: No results for input(s): TSH, T4TOTAL, FREET4, T3FREE, THYROIDAB in the last 72 hours. Anemia Panel: No results for input(s): VITAMINB12,  FOLATE, FERRITIN, TIBC, IRON, RETICCTPCT in the last 72 hours. Urine analysis:    Component Value Date/Time   COLORURINE YELLOW 08/05/2016 1349   APPEARANCEUR CLEAR 08/05/2016 1349   LABSPEC >1.046 (H) 08/05/2016 1349   PHURINE 6.0 08/05/2016 1349   GLUCOSEU NEGATIVE 08/05/2016 1349   HGBUR NEGATIVE 08/05/2016 1349   BILIRUBINUR NEGATIVE 08/05/2016 1349   KETONESUR NEGATIVE 08/05/2016 1349   PROTEINUR NEGATIVE 08/05/2016 1349   UROBILINOGEN 1.0 11/05/2014 2347   NITRITE NEGATIVE 08/05/2016 1349   LEUKOCYTESUR NEGATIVE 08/05/2016 1349     ) Recent Results (from the past 240 hour(s))  Culture, blood (routine x 2) Call MD if unable to obtain prior to antibiotics being given     Status: None (Preliminary result)   Collection Time: 08/05/16 10:15 AM  Result Value Ref Range Status   Specimen Description BLOOD BLOOD LEFT HAND  Final   Special Requests IN PEDIATRIC BOTTLE 3CC  Final   Culture NO GROWTH < 12 HOURS  Final   Report Status PENDING  Incomplete  Culture, blood (routine x 2) Call MD if unable to obtain prior to antibiotics being given     Status: None (Preliminary result)   Collection Time: 08/05/16 10:15 AM  Result Value Ref Range Status   Specimen Description BLOOD RIGHT ANTECUBITAL  Final   Special Requests IN PEDIATRIC BOTTLE 3CC  Final   Culture  Setup Time   Final    GRAM POSITIVE COCCI IN CLUSTERS IN PEDIATRIC BOTTLE Organism ID to follow CRITICAL RESULT CALLED TO, READ BACK BY AND VERIFIED WITH: [facility] [id number] J FUDESCO    Culture NO GROWTH < 12 HOURS  Final   Report Status PENDING  Incomplete  Blood Culture ID Panel (Reflexed)     Status: Abnormal   Collection Time: 08/05/16 10:15 AM  Result Value Ref Range Status   Enterococcus species NOT DETECTED NOT DETECTED Final   Listeria monocytogenes NOT DETECTED NOT DETECTED Final   Staphylococcus species DETECTED (A) NOT DETECTED Final    Comment: CRITICAL RESULT CALLED TO, READ BACK BY AND VERIFIED  WITH: [facility] [id number] BY J FUDESCO    Staphylococcus aureus NOT DETECTED NOT DETECTED Final   Methicillin resistance DETECTED (A) NOT DETECTED Final    Comment: CRITICAL RESULT CALLED TO, READ BACK BY AND VERIFIED WITH: [facility] [phone] BY J FUDESCO    Streptococcus species NOT DETECTED NOT DETECTED Final   Streptococcus agalactiae NOT DETECTED NOT DETECTED Final   Streptococcus pneumoniae NOT DETECTED NOT DETECTED Final   Streptococcus pyogenes NOT DETECTED NOT DETECTED Final   Acinetobacter baumannii NOT DETECTED  NOT DETECTED Final   Enterobacteriaceae species NOT DETECTED NOT DETECTED Final   Enterobacter cloacae complex NOT DETECTED NOT DETECTED Final   Escherichia coli NOT DETECTED NOT DETECTED Final   Klebsiella oxytoca NOT DETECTED NOT DETECTED Final   Klebsiella pneumoniae NOT DETECTED NOT DETECTED Final   Proteus species NOT DETECTED NOT DETECTED Final   Serratia marcescens NOT DETECTED NOT DETECTED Final   Haemophilus influenzae NOT DETECTED NOT DETECTED Final   Neisseria meningitidis NOT DETECTED NOT DETECTED Final   Pseudomonas aeruginosa NOT DETECTED NOT DETECTED Final   Candida albicans NOT DETECTED NOT DETECTED Final   Candida glabrata NOT DETECTED NOT DETECTED Final   Candida krusei NOT DETECTED NOT DETECTED Final   Candida parapsilosis NOT DETECTED NOT DETECTED Final   Candida tropicalis NOT DETECTED NOT DETECTED Final      Anti-infectives    Start     Dose/Rate Route Frequency Ordered Stop   08/06/16 2200  levofloxacin (LEVAQUIN) IVPB 750 mg  Status:  Discontinued    Comments:  For 5 days. First dose was given at Med Center at [facility] advise on second dose   750 mg 100 mL/hr over 90 Minutes Intravenous Every 24 hours 08/05/16 0945 08/05/16 1014   08/06/16 0600  levofloxacin (LEVAQUIN) IVPB 750 mg    Comments:  For 5 days. First dose was given at Med Center at [facility] advise on second dose   750 mg 100 mL/hr over 90 Minutes  Intravenous Every 24 hours 08/05/16 1014 08/10/16 0559   08/05/16 0530  levofloxacin (LEVAQUIN) IVPB 750 mg     750 mg 100 mL/hr over 90 Minutes Intravenous  Once 08/05/16 0523 08/05/16 1610       Radiology Studies: Dg Chest 2 View  Result Date: 08/06/2016 CLINICAL DATA:  History of cough. EXAM: CHEST  2 VIEW COMPARISON:  Chest CT 08/05/2016.  Chest x-ray 08/05/2016. FINDINGS: Mediastinum hilar structures normal. Mild basilar interstitial prominence noted. Mild pneumonitis cannot be excluded. No pleural effusion or pneumothorax. Heart size normal. No acute bony abnormality. IMPRESSION: Mild basilar interstitial prominence noted. Mild pneumonitis cannot be excluded. Electronically Signed   By: Maisie Fus  Register   On: 08/06/2016 07:45   Dg Chest 2 View  Result Date: 08/05/2016 CLINICAL DATA:  Nonproductive cough. Nasal and chest congestion. Difficulty breathing. Quit smoking 4 years ago. EXAM: CHEST  2 VIEW COMPARISON:  10/03/2011 FINDINGS: The heart size and mediastinal contours are within normal limits. Both lungs are clear. The visualized skeletal structures are unremarkable. IMPRESSION: No active cardiopulmonary disease. Electronically Signed   By: Burman Nieves M.D.   On: 08/05/2016 03:53   Ct Angio Chest Pe W Or Wo Contrast  Result Date: 08/05/2016 CLINICAL DATA:  Nonproductive cough. Chest congestion. Difficulty breathing. Quit smoking 4 years ago. D-dimer 0.54 EXAM: CT ANGIOGRAPHY CHEST WITH CONTRAST TECHNIQUE: Multidetector CT imaging of the chest was performed using the standard protocol during bolus administration of intravenous contrast. Multiplanar CT image reconstructions and MIPs were obtained to evaluate the vascular anatomy. CONTRAST:  100 mL Isovue 370 COMPARISON:  None. FINDINGS: Cardiovascular: Satisfactory opacification of the pulmonary arteries to the segmental level. No evidence of pulmonary embolism. Normal heart size. No pericardial effusion. Mediastinum/Nodes: No enlarged  mediastinal, hilar, or axillary lymph nodes. Thyroid gland, trachea, and esophagus demonstrate no significant findings. Lungs/Pleura: Patchy nodular infiltrates demonstrated in the perihilar regions bilaterally, right upper lung, right middle lung, left lingula, and both lower lungs. This is likely to represent multifocal pneumonia. No pleural effusions. No  pneumothorax. Airways are patent. Upper Abdomen: No acute abnormality. Musculoskeletal: Bilateral breast implants. No destructive bone lesions. Review of the MIP images confirms the above findings. IMPRESSION: No evidence of significant pulmonary embolus. Patchy nodular infiltrates in both lungs are nonspecific but most likely to represent multifocal pneumonia in the setting of chest congestion. Electronically Signed   By: Burman Nieves M.D.   On: 08/05/2016 05:15        Scheduled Meds: . benzonatate  200 mg Oral TID  . desvenlafaxine  100 mg Oral Daily  . enoxaparin (LOVENOX) injection  40 mg Subcutaneous Q24H  . guaiFENesin  1,200 mg Oral BID  . levofloxacin (LEVAQUIN) IV  750 mg Intravenous Q24H   Continuous Infusions: . sodium chloride 75 mL/hr at 08/06/16 1031     LOS: 0 days    Time spent: 35 min    JESSICA U VANN, DO [facility] Pager [phone]  If 7PM-7AM, please contact night-coverage [email or web address] Password [email or web address] 08/06/2016, 10:55 AM

## 2016-08-06 NOTE — Progress Notes (Signed)
PHARMACY - PHYSICIAN COMMUNICATION CRITICAL VALUE ALERT - BLOOD CULTURE IDENTIFICATION (BCID)  Results for orders placed or performed during the hospital encounter of 08/05/16  Blood Culture ID Panel (Reflexed) (Collected: 08/05/2016 10:15 AM)  Result Value Ref Range   Enterococcus species NOT DETECTED NOT DETECTED   Listeria monocytogenes NOT DETECTED NOT DETECTED   Staphylococcus species DETECTED (A) NOT DETECTED   Staphylococcus aureus NOT DETECTED NOT DETECTED   Methicillin resistance DETECTED (A) NOT DETECTED   Streptococcus species NOT DETECTED NOT DETECTED   Streptococcus agalactiae NOT DETECTED NOT DETECTED   Streptococcus pneumoniae NOT DETECTED NOT DETECTED   Streptococcus pyogenes NOT DETECTED NOT DETECTED   Acinetobacter baumannii NOT DETECTED NOT DETECTED   Enterobacteriaceae species NOT DETECTED NOT DETECTED   Enterobacter cloacae complex NOT DETECTED NOT DETECTED   Escherichia coli NOT DETECTED NOT DETECTED   Klebsiella oxytoca NOT DETECTED NOT DETECTED   Klebsiella pneumoniae NOT DETECTED NOT DETECTED   Proteus species NOT DETECTED NOT DETECTED   Serratia marcescens NOT DETECTED NOT DETECTED   Haemophilus influenzae NOT DETECTED NOT DETECTED   Neisseria meningitidis NOT DETECTED NOT DETECTED   Pseudomonas aeruginosa NOT DETECTED NOT DETECTED   Candida albicans NOT DETECTED NOT DETECTED   Candida glabrata NOT DETECTED NOT DETECTED   Candida krusei NOT DETECTED NOT DETECTED   Candida parapsilosis NOT DETECTED NOT DETECTED   Candida tropicalis NOT DETECTED NOT DETECTED    Name of physician (or Provider) Contacted: Dr. Benjamine MolaVann  Changes to prescribed antibiotics required: none, likely contaminant   Sheron NightingaleJames A  08/06/2016  10:14 AM

## 2016-08-06 NOTE — Progress Notes (Signed)
Patient going off floor to Xray with transport via wheelchair. Alert and oriented, c/o cough and pain. Will medicate upon return to unit.

## 2016-08-06 NOTE — Progress Notes (Signed)
Dr. Clearence PedSchorr paged to notify patient c/o nausea. Antiemetic requested. Awaiting response.

## 2016-08-07 LAB — CULTURE, BLOOD (ROUTINE X 2)

## 2016-08-07 LAB — URINE CULTURE

## 2016-08-07 MED ORDER — GUAIFENESIN ER 600 MG PO TB12
1200.0000 mg | ORAL_TABLET | Freq: Two times a day (BID) | ORAL | Status: DC
Start: 2016-08-07 — End: 2018-11-15

## 2016-08-07 MED ORDER — LEVOFLOXACIN 750 MG PO TABS: 750.0000 mg | ORAL_TABLET | Freq: Every day | ORAL | 0 refills | Status: DC

## 2016-08-07 MED ORDER — OXYCODONE-ACETAMINOPHEN 5-325 MG PO TABS: 1.0000 | ORAL_TABLET | ORAL | 0 refills | Status: DC | PRN

## 2016-08-07 MED ORDER — BENZONATATE 200 MG PO CAPS: 200.0000 mg | ORAL_CAPSULE | Freq: Three times a day (TID) | ORAL | 0 refills | Status: DC | PRN

## 2016-08-07 NOTE — Discharge Instructions (Signed)
Follow with SUN,VYVYAN Y, MD in 5-7 days ° °Please get a complete blood count and chemistry panel checked by your Primary MD at your next visit, and again as instructed by your Primary MD. Please get your medications reviewed and adjusted by your Primary MD. ° °Please request your Primary MD to go over all Hospital Tests and Procedure/Radiological results at the follow up, please get all Hospital records sent to your Prim MD by signing hospital release before you go home. ° °If you had Pneumonia of Lung problems at the Hospital: °Please get a 2 view Chest X ray done in 6-8 weeks after hospital discharge or sooner if instructed by your Primary MD. ° °If you have Congestive Heart Failure: °Please call your Cardiologist or Primary MD anytime you have any of the following symptoms:  °1) 3 pound weight gain in 24 hours or 5 pounds in 1 week  °2) shortness of breath, with or without a dry hacking cough  °3) swelling in the hands, feet or stomach  °4) if you have to sleep on extra pillows at night in order to breathe ° °Follow cardiac low salt diet and 1.5 lit/day fluid restriction. ° °If you have diabetes °Accuchecks 4 times/day, Once in AM empty stomach and then before each meal. °Log in all results and show them to your primary doctor at your next visit. °If any glucose reading is under 80 or above 300 call your primary MD immediately. ° °If you have Seizure/Convulsions/Epilepsy: °Please do not drive, operate heavy machinery, participate in activities at heights or participate in high speed sports until you have seen by Primary MD or a Neurologist and advised to do so again. ° °If you had Gastrointestinal Bleeding: °Please ask your Primary MD to check a complete blood count within one week of discharge or at your next visit. Your endoscopic/colonoscopic biopsies that are pending at the time of discharge, will also need to followed by your Primary MD. ° °Get Medicines reviewed and adjusted. °Please take all your  medications with you for your next visit with your Primary MD ° °Please request your Primary MD to go over all hospital tests and procedure/radiological results at the follow up, please ask your Primary MD to get all Hospital records sent to his/her office. ° °If you experience worsening of your admission symptoms, develop shortness of breath, life threatening emergency, suicidal or homicidal thoughts you must seek medical attention immediately by calling 911 or calling your MD immediately  if symptoms less severe. ° °You must read complete instructions/literature along with all the possible adverse reactions/side effects for all the Medicines you take and that have been prescribed to you. Take any new Medicines after you have completely understood and accpet all the possible adverse reactions/side effects.  ° °Do not drive or operate heavy machinery when taking Pain medications.  ° °Do not take more than prescribed Pain, Sleep and Anxiety Medications ° °Special Instructions: If you have smoked or chewed Tobacco  in the last 2 yrs please stop smoking, stop any regular Alcohol  and or any Recreational drug use. ° °Wear Seat belts while driving. ° °Please note °You were cared for by a hospitalist during your hospital stay. If you have any questions about your discharge medications or the care you received while you were in the hospital after you are discharged, you can call the unit and asked to speak with the hospitalist on call if the hospitalist that took care of you is not available. Once   you are discharged, your primary care physician will handle any further medical issues. Please note that NO REFILLS for any discharge medications will be authorized once you are discharged, as it is imperative that you return to your primary care physician (or establish a relationship with a primary care physician if you do not have one) for your aftercare needs so that they can reassess your need for medications and monitor your  lab values.  You can reach the hospitalist office at phone [phone] or fax [phone]   If you do not have a primary care physician, you can call [phone] for a physician referral.  Activity: As tolerated with Full fall precautions use walker/cane & assistance as needed

## 2016-08-07 NOTE — Discharge Summary (Signed)
Physician Discharge Summary  ELIYANNA AULT Lindsey:[id number] DOB: 10/17/1980 DOA: 08/05/2016  PCP: Leanor Rubenstein, MD  Admit date: 08/05/2016 Discharge date: 08/07/2016   Recommendations for Outpatient Follow-Up:      Discharge Diagnosis:   Active Problems:   Multifocal pneumonia   Anxiety   Blood dyscrasia   Depression   Other insomnia   Discharge disposition:  Home.   Discharge Condition: Improved.  Diet recommendation:  Regular.  Wound care: None.   History of Present Illness:   Alexis Lindsey is a 36 y.o. female  with a history of anxiety, ANA positive, history of migraines, depression  presenting to medical Center at [facility], with progressive shortness of breath since last Friday, non productive cough, low grade fever, and central chest pain with cough and deep inspiration. She does not recall any sick contacts. She denies any headaches, nausea or vomiting. She denies any abdominal pain or diarrhea. She denies any lower extremity swelling. She denies any recent long distance trips. She has not been recently treated for any other respiratory infections. No recent surgeries. Does not smoke     Hospital Course by Problem:   multifocal PNA Blood and sputum cultures- 1/4 BC + as a contaminant Tessalon pearles Mucinex  -pain control  Anxiety/ Depression Continue home meds  Blood Dyscrasia, with +ANA, no acute event CT angio negative for PE  No intervention indicated at this time  Outpatient follow up    Medical Consultants:    None.   Discharge Exam:   Vitals:   08/07/16 0054 08/07/16 0524  BP: (!) 104/58 (!) 98/52  Pulse: 71 61  Resp: 20 18  Temp: 98.6 F (37 C) 98.6 F (37 C)   Vitals:   08/06/16 0937 08/06/16 2202 08/07/16 0054 08/07/16 0524  BP: 105/76 99/60 (!) 104/58 (!) 98/52  Pulse: 72 60 71 61  Resp: 20 20 20 18   Temp: 97.5 F (36.4 C) 98.4 F (36.9 C) 98.6 F (37 C) 98.6 F (37 C)  TempSrc: Oral Oral Oral Oral    SpO2: 96% 97% 98% 98%  Weight:      Height:        Gen:  NAD    The results of significant diagnostics from this hospitalization (including imaging, microbiology, ancillary and laboratory) are listed below for reference.     Procedures and Diagnostic Studies:   Dg Chest 2 View  Result Date: 08/06/2016 CLINICAL DATA:  History of cough. EXAM: CHEST  2 VIEW COMPARISON:  Chest CT 08/05/2016.  Chest x-ray 08/05/2016. FINDINGS: Mediastinum hilar structures normal. Mild basilar interstitial prominence noted. Mild pneumonitis cannot be excluded. No pleural effusion or pneumothorax. Heart size normal. No acute bony abnormality. IMPRESSION: Mild basilar interstitial prominence noted. Mild pneumonitis cannot be excluded. Electronically Signed   By: Maisie Fus  Register   On: 08/06/2016 07:45   Dg Chest 2 View  Result Date: 08/05/2016 CLINICAL DATA:  Nonproductive cough. Nasal and chest congestion. Difficulty breathing. Quit smoking 4 years ago. EXAM: CHEST  2 VIEW COMPARISON:  10/03/2011 FINDINGS: The heart size and mediastinal contours are within normal limits. Both lungs are clear. The visualized skeletal structures are unremarkable. IMPRESSION: No active cardiopulmonary disease. Electronically Signed   By: Burman Nieves M.D.   On: 08/05/2016 03:53   Ct Angio Chest Pe W Or Wo Contrast  Result Date: 08/05/2016 CLINICAL DATA:  Nonproductive cough. Chest congestion. Difficulty breathing. Quit smoking 4 years ago. D-dimer 0.54 EXAM: CT ANGIOGRAPHY CHEST WITH CONTRAST TECHNIQUE: Multidetector  CT imaging of the chest was performed using the standard protocol during bolus administration of intravenous contrast. Multiplanar CT image reconstructions and MIPs were obtained to evaluate the vascular anatomy. CONTRAST:  100 mL Isovue 370 COMPARISON:  None. FINDINGS: Cardiovascular: Satisfactory opacification of the pulmonary arteries to the segmental level. No evidence of pulmonary embolism. Normal heart size. No  pericardial effusion. Mediastinum/Nodes: No enlarged mediastinal, hilar, or axillary lymph nodes. Thyroid gland, trachea, and esophagus demonstrate no significant findings. Lungs/Pleura: Patchy nodular infiltrates demonstrated in the perihilar regions bilaterally, right upper lung, right middle lung, left lingula, and both lower lungs. This is likely to represent multifocal pneumonia. No pleural effusions. No pneumothorax. Airways are patent. Upper Abdomen: No acute abnormality. Musculoskeletal: Bilateral breast implants. No destructive bone lesions. Review of the MIP images confirms the above findings. IMPRESSION: No evidence of significant pulmonary embolus. Patchy nodular infiltrates in both lungs are nonspecific but most likely to represent multifocal pneumonia in the setting of chest congestion. Electronically Signed   By: Burman NievesWilliam  Stevens M.D.   On: 08/05/2016 05:15     Labs:   Basic Metabolic Panel:  Recent Labs Lab 08/05/16 0320 08/06/16 0624  NA 136 135  K 3.7 4.1  CL 106 106  CO2 21* 24  GLUCOSE 100* 92  BUN 12 15  CREATININE 0.88 0.88  CALCIUM 9.1 8.6*   GFR Estimated Creatinine Clearance: 90.9 mL/min (by C-G formula based on SCr of 0.88 mg/dL). Liver Function Tests:  Recent Labs Lab 08/06/16 0624  AST 23  ALT 30  ALKPHOS 53  BILITOT 0.2*  PROT 5.6*  ALBUMIN 3.1*   No results for input(s): LIPASE, AMYLASE in the last 168 hours. No results for input(s): AMMONIA in the last 168 hours. Coagulation profile No results for input(s): INR, PROTIME in the last 168 hours.  CBC:  Recent Labs Lab 08/05/16 0320 08/06/16 0624  WBC 7.8 4.7  NEUTROABS 4.5  --   HGB 13.2 12.1  HCT 39.2 36.7  MCV 84.5 86.8  PLT 197 153   Cardiac Enzymes:  Recent Labs Lab 08/05/16 0320  TROPONINI <0.03   BNP: Invalid input(s): POCBNP CBG: No results for input(s): GLUCAP in the last 168 hours. D-Dimer  Recent Labs  08/05/16 0320  DDIMER 0.54*   Hgb A1c No results for  input(s): HGBA1C in the last 72 hours. Lipid Profile No results for input(s): CHOL, HDL, LDLCALC, TRIG, CHOLHDL, LDLDIRECT in the last 72 hours. Thyroid function studies No results for input(s): TSH, T4TOTAL, T3FREE, THYROIDAB in the last 72 hours.  Invalid input(s): FREET3 Anemia work up No results for input(s): VITAMINB12, FOLATE, FERRITIN, TIBC, IRON, RETICCTPCT in the last 72 hours. Microbiology Recent Results (from the past 240 hour(s))  Culture, blood (routine x 2) Call MD if unable to obtain prior to antibiotics being given     Status: None (Preliminary result)   Collection Time: 08/05/16 10:15 AM  Result Value Ref Range Status   Specimen Description BLOOD BLOOD LEFT HAND  Final   Special Requests IN PEDIATRIC BOTTLE 3CC  Final   Culture NO GROWTH 1 DAY  Final   Report Status PENDING  Incomplete  Culture, blood (routine x 2) Call MD if unable to obtain prior to antibiotics being given     Status: None (Preliminary result)   Collection Time: 08/05/16 10:15 AM  Result Value Ref Range Status   Specimen Description BLOOD RIGHT ANTECUBITAL  Final   Special Requests IN PEDIATRIC BOTTLE [facility]  Final   Culture  Setup Time   Final    GRAM POSITIVE COCCI IN CLUSTERS IN PEDIATRIC BOTTLE Organism ID to follow CRITICAL RESULT CALLED TO, READ BACK BY AND VERIFIED WITH: A. [facility] [phone] [id number] J FUDESCO    Culture NO GROWTH 1 DAY  Final   Report Status PENDING  Incomplete  Blood Culture ID Panel (Reflexed)     Status: Abnormal   Collection Time: 08/05/16 10:15 AM  Result Value Ref Range Status   Enterococcus species NOT DETECTED NOT DETECTED Final   Listeria monocytogenes NOT DETECTED NOT DETECTED Final   Staphylococcus species DETECTED (A) NOT DETECTED Final    Comment: CRITICAL RESULT CALLED TO, READ BACK BY AND VERIFIED WITH: A. [facility] [phone] BY J FUDESCO    Staphylococcus aureus NOT DETECTED NOT DETECTED Final   Methicillin resistance DETECTED (A) NOT DETECTED  Final    Comment: CRITICAL RESULT CALLED TO, READ BACK BY AND VERIFIED WITH: A. [facility] [phone] BY J FUDESCO    Streptococcus species NOT DETECTED NOT DETECTED Final   Streptococcus agalactiae NOT DETECTED NOT DETECTED Final   Streptococcus pneumoniae NOT DETECTED NOT DETECTED Final   Streptococcus pyogenes NOT DETECTED NOT DETECTED Final   Acinetobacter baumannii NOT DETECTED NOT DETECTED Final   Enterobacteriaceae species NOT DETECTED NOT DETECTED Final   Enterobacter cloacae complex NOT DETECTED NOT DETECTED Final   Escherichia coli NOT DETECTED NOT DETECTED Final   Klebsiella oxytoca NOT DETECTED NOT DETECTED Final   Klebsiella pneumoniae NOT DETECTED NOT DETECTED Final   Proteus species NOT DETECTED NOT DETECTED Final   Serratia marcescens NOT DETECTED NOT DETECTED Final   Haemophilus influenzae NOT DETECTED NOT DETECTED Final   Neisseria meningitidis NOT DETECTED NOT DETECTED Final   Pseudomonas aeruginosa NOT DETECTED NOT DETECTED Final   Candida albicans NOT DETECTED NOT DETECTED Final   Candida glabrata NOT DETECTED NOT DETECTED Final   Candida krusei NOT DETECTED NOT DETECTED Final   Candida parapsilosis NOT DETECTED NOT DETECTED Final   Candida tropicalis NOT DETECTED NOT DETECTED Final     Discharge Instructions:   Discharge Instructions    Diet general    Complete by:  As directed    Increase activity slowly    Complete by:  As directed      Allergies as of 08/07/2016      Reactions   Macrobid [nitrofurantoin Macrocrystal] Other (See Comments)   Nausea and  vomiitng    Codeine Nausea And Vomiting      Medication List    STOP taking these medications   azithromycin 250 MG tablet Commonly known as:  ZITHROMAX     TAKE these medications   benzonatate 200 MG capsule Commonly known as:  TESSALON Take 1 capsule (200 mg total) by mouth 3 (three) times daily as needed for cough.   clonazePAM 0.5 MG tablet Commonly known as:  KLONOPIN Take 0.5 mg  by mouth 2 (two) times daily as needed for anxiety.   desvenlafaxine 100 MG 24 hr tablet Commonly known as:  PRISTIQ Take 100 mg by mouth daily.   guaiFENesin 600 MG 12 hr tablet Commonly known as:  MUCINEX Take 2 tablets (1,200 mg total) by mouth 2 (two) times daily.   ibuprofen 200 MG tablet Commonly known as:  ADVIL,MOTRIN Take 800 mg by mouth every 6 (six) hours as needed for headache or moderate pain. What changed:  Another medication with the same name was removed. Continue taking this medication, and follow the directions you  see here.   levofloxacin 750 MG tablet Commonly known as:  LEVAQUIN Take 1 tablet (750 mg total) by mouth daily.   oxyCODONE-acetaminophen 5-325 MG tablet Commonly known as:  PERCOCET/ROXICET Take 1-2 tablets by mouth every 4 (four) hours as needed for moderate pain or severe pain (every 4-6 hrs prn pain). What changed:  how much to take  reasons to take this  Another medication with the same name was removed. Continue taking this medication, and follow the directions you see here.   VYVANSE 70 MG capsule Generic drug:  lisdexamfetamine Take 70 mg by mouth daily.   zolpidem 10 MG tablet Commonly known as:  AMBIEN Take 10 mg by mouth at bedtime as needed for sleep.      Follow-up Information    establish with PCP Follow up.            Time coordinating discharge: 35 min  Signed:  JESSICA U VANN   [facility] 08/07/2016, 9:13 AM

## 2016-08-07 NOTE — Care Management Note (Signed)
Case Management Note  Patient Details  Name: Alexis Lindsey MRN: [id number] Date of Birth: [birth year]  Subjective/Objective:                    Action/Plan: Pt discharging home with self care. Pt with insurance and PCP. No further needs per CM.   Expected Discharge Date:  08/07/16               Expected Discharge Plan:  Home/Self Care  In-House Referral:     Discharge planning Services     Post Acute Care Choice:    Choice offered to:     DME Arranged:    DME Agency:     HH Arranged:    HH Agency:     Status of Service:  Completed, signed off  If discussed at [employer] Length of Stay Meetings, dates discussed:    Additional Comments:  Kermit BaloKelli F , RN 08/07/2016, 10:30 AM

## 2016-08-07 NOTE — Progress Notes (Signed)
Pt being discharged from hospital per orders from MD. Pt educated on discharge instructions. Pt verbalized understanding of instructions. All questions and concerns were addressed. Pt's IV was removed prior to discharge. Pt exited hospital via wheelchair. 

## 2016-08-10 LAB — CULTURE, BLOOD (ROUTINE X 2): Culture: NO GROWTH

## 2017-07-25 DIAGNOSIS — F339 Major depressive disorder, recurrent, unspecified: Secondary | ICD-10-CM | POA: Insufficient documentation

## 2018-01-26 ENCOUNTER — Other Ambulatory Visit: Payer: Self-pay

## 2018-01-26 ENCOUNTER — Encounter (HOSPITAL_COMMUNITY): Payer: Self-pay | Admitting: *Deleted

## 2018-01-26 ENCOUNTER — Emergency Department (HOSPITAL_COMMUNITY)
Admission: EM | Admit: 2018-01-26 | Discharge: 2018-01-26 | Disposition: A | Discharge status: Home / Self Care | Payer: BLUE CROSS/BLUE SHIELD | Attending: Emergency Medicine | Admitting: Emergency Medicine

## 2018-01-26 DIAGNOSIS — W57XXXA Bitten or stung by nonvenomous insect and other nonvenomous arthropods, initial encounter: Secondary | ICD-10-CM | POA: Diagnosis not present

## 2018-01-26 DIAGNOSIS — S60561A Insect bite (nonvenomous) of right hand, initial encounter: Secondary | ICD-10-CM | POA: Insufficient documentation

## 2018-01-26 DIAGNOSIS — S40862A Insect bite (nonvenomous) of left upper arm, initial encounter: Secondary | ICD-10-CM | POA: Insufficient documentation

## 2018-01-26 DIAGNOSIS — Y93H2 Activity, gardening and landscaping: Secondary | ICD-10-CM | POA: Diagnosis not present

## 2018-01-26 DIAGNOSIS — Y92007 Garden or yard of unspecified non-institutional (private) residence as the place of occurrence of the external cause: Secondary | ICD-10-CM | POA: Diagnosis not present

## 2018-01-26 DIAGNOSIS — Z79899 Other long term (current) drug therapy: Secondary | ICD-10-CM | POA: Diagnosis not present

## 2018-01-26 DIAGNOSIS — Y999 Unspecified external cause status: Secondary | ICD-10-CM | POA: Diagnosis not present

## 2018-01-26 DIAGNOSIS — S6991XA Unspecified injury of right wrist, hand and finger(s), initial encounter: Secondary | ICD-10-CM | POA: Diagnosis present

## 2018-01-26 HISTORY — DX: Bitten by nonvenomous snake, initial encounter: W59.11XA

## 2018-01-26 LAB — CBC WITH DIFFERENTIAL/PLATELET
ABS IMMATURE GRANULOCYTES: 0 10*3/uL (ref 0.0–0.1)
BASOS ABS: 0.1 10*3/uL (ref 0.0–0.1)
Basophils Relative: 1 %
Eosinophils Absolute: 0.6 10*3/uL (ref 0.0–0.7)
Eosinophils Relative: 7 %
HEMATOCRIT: 41.2 % (ref 36.0–46.0)
HEMOGLOBIN: 13.6 g/dL (ref 12.0–15.0)
Immature Granulocytes: 0 %
LYMPHS ABS: 2.7 10*3/uL (ref 0.7–4.0)
LYMPHS PCT: 29 %
MCH: 29.4 pg (ref 26.0–34.0)
MCHC: 33 g/dL (ref 30.0–36.0)
MCV: 89.2 fL (ref 78.0–100.0)
MONO ABS: 0.5 10*3/uL (ref 0.1–1.0)
MONOS PCT: 5 %
NEUTROS ABS: 5.4 10*3/uL (ref 1.7–7.7)
Neutrophils Relative %: 58 %
Platelets: 225 10*3/uL (ref 150–400)
RBC: 4.62 MIL/uL (ref 3.87–5.11)
RDW: 11.9 % (ref 11.5–15.5)
WBC: 9.3 10*3/uL (ref 4.0–10.5)

## 2018-01-26 LAB — COMPREHENSIVE METABOLIC PANEL
ALT: 18 U/L (ref 0–44)
ANION GAP: 8 (ref 5–15)
AST: 19 U/L (ref 15–41)
Albumin: 3.9 g/dL (ref 3.5–5.0)
Alkaline Phosphatase: 47 U/L (ref 38–126)
BUN: 16 mg/dL (ref 6–20)
CHLORIDE: 105 mmol/L (ref 98–111)
CO2: 25 mmol/L (ref 22–32)
Calcium: 9 mg/dL (ref 8.9–10.3)
Creatinine, Ser: 1.07 mg/dL — ABNORMAL HIGH (ref 0.44–1.00)
GFR calc Af Amer: >60 mL/min (ref >60)
GFR calc non Af Amer: >60 mL/min (ref >60)
GLUCOSE: 85 mg/dL (ref 70–99)
POTASSIUM: 3.8 mmol/L (ref 3.5–5.1)
SODIUM: 138 mmol/L (ref 135–145)
TOTAL PROTEIN: 6.2 g/dL — AB (ref 6.5–8.1)
Total Bilirubin: 0.5 mg/dL (ref 0.3–1.2)

## 2018-01-26 LAB — PROTIME-INR
INR: 0.97
PROTHROMBIN TIME: 12.8 s (ref 11.4–15.2)

## 2018-01-26 LAB — I-STAT BETA HCG BLOOD, ED (MC, WL, AP ONLY): I-stat hCG, quantitative: <5 m[IU]/mL (ref <5)

## 2018-01-26 MED ORDER — TRAMADOL HCL 50 MG PO TABS: 50.0000 mg | ORAL_TABLET | Freq: Four times a day (QID) | ORAL | 0 refills | Status: DC | PRN

## 2018-01-26 MED ORDER — PREDNISONE 10 MG PO TABS: 40.0000 mg | ORAL_TABLET | Freq: Every day | ORAL | 0 refills | Status: AC

## 2018-01-26 MED ORDER — TRAMADOL HCL 50 MG PO TABS
50.0000 mg | ORAL_TABLET | Freq: Once | ORAL | Status: AC
  Administered 2018-01-26: 50 mg via ORAL
  Filled 2018-01-26: qty 1

## 2018-01-26 MED ORDER — KETOROLAC TROMETHAMINE 30 MG/ML IJ SOLN
30.0000 mg | Freq: Once | INTRAMUSCULAR | Status: AC
  Administered 2018-01-26: 30 mg via INTRAVENOUS
  Filled 2018-01-26: qty 1

## 2018-01-26 MED ORDER — DIPHENHYDRAMINE HCL 50 MG/ML IJ SOLN
25.0000 mg | Freq: Once | INTRAMUSCULAR | Status: AC
  Administered 2018-01-26: 25 mg via INTRAVENOUS
  Filled 2018-01-26: qty 1

## 2018-01-26 MED ORDER — ONDANSETRON HCL 4 MG/2ML IJ SOLN
4.0000 mg | Freq: Once | INTRAMUSCULAR | Status: AC
  Administered 2018-01-26: 4 mg via INTRAVENOUS
  Filled 2018-01-26: qty 2

## 2018-01-26 MED ORDER — ONDANSETRON 4 MG PO TBDP: 4.0000 mg | ORAL_TABLET | Freq: Three times a day (TID) | ORAL | 0 refills | Status: DC | PRN

## 2018-01-26 MED ORDER — SODIUM CHLORIDE 0.9 % IV BOLUS
1000.0000 mL | Freq: Once | INTRAVENOUS | Status: AC
  Administered 2018-01-26: 1000 mL via INTRAVENOUS

## 2018-01-26 MED ORDER — IBUPROFEN 800 MG PO TABS: 800.0000 mg | ORAL_TABLET | Freq: Three times a day (TID) | ORAL | 0 refills | Status: DC | PRN

## 2018-01-26 MED ORDER — MORPHINE SULFATE (PF) 4 MG/ML IV SOLN
4.0000 mg | Freq: Once | INTRAVENOUS | Status: AC
  Administered 2018-01-26: 4 mg via INTRAVENOUS
  Filled 2018-01-26: qty 1
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
## 2018-01-26 NOTE — Discharge Instructions (Signed)
You were seen in the ED today with a bote of some kind. Given your rash and swelling pattern I doubt that this was a snake bite. I consulted [place] poison control who did not recommend anti-venom in this case. Continue to treat pain with the medications provided. Call your PCP in the morning to arrange a follow up appointment. Return to the ED with any new or worsening symptoms.
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
## 2018-07-18 ENCOUNTER — Encounter (HOSPITAL_COMMUNITY): Payer: Self-pay | Admitting: Emergency Medicine

## 2018-07-18 ENCOUNTER — Emergency Department (HOSPITAL_COMMUNITY)
Admission: EM | Admit: 2018-07-18 | Discharge: 2018-07-18 | Disposition: A | Discharge status: Home / Self Care | Payer: BLUE CROSS/BLUE SHIELD | Attending: Emergency Medicine | Admitting: Emergency Medicine

## 2018-07-18 ENCOUNTER — Other Ambulatory Visit: Payer: Self-pay

## 2018-07-18 DIAGNOSIS — Z87891 Personal history of nicotine dependence: Secondary | ICD-10-CM | POA: Diagnosis not present

## 2018-07-18 DIAGNOSIS — R197 Diarrhea, unspecified: Secondary | ICD-10-CM | POA: Insufficient documentation

## 2018-07-18 DIAGNOSIS — Z79899 Other long term (current) drug therapy: Secondary | ICD-10-CM | POA: Diagnosis not present

## 2018-07-18 DIAGNOSIS — R112 Nausea with vomiting, unspecified: Secondary | ICD-10-CM | POA: Diagnosis not present

## 2018-07-18 LAB — I-STAT BETA HCG BLOOD, ED (MC, WL, AP ONLY): I-stat hCG, quantitative: <5 m[IU]/mL (ref <5)

## 2018-07-18 LAB — CBC
HCT: 46.9 % — ABNORMAL HIGH (ref 36.0–46.0)
HEMOGLOBIN: 15.1 g/dL — AB (ref 12.0–15.0)
MCH: 28.8 pg (ref 26.0–34.0)
MCHC: 32.2 g/dL (ref 30.0–36.0)
MCV: 89.5 fL (ref 80.0–100.0)
NRBC: 0 % (ref 0.0–0.2)
Platelets: 205 10*3/uL (ref 150–400)
RBC: 5.24 MIL/uL — AB (ref 3.87–5.11)
RDW: 12.3 % (ref 11.5–15.5)
WBC: 10.7 10*3/uL — ABNORMAL HIGH (ref 4.0–10.5)

## 2018-07-18 LAB — COMPREHENSIVE METABOLIC PANEL
ALBUMIN: 4.2 g/dL (ref 3.5–5.0)
ALT: 58 U/L — ABNORMAL HIGH (ref 0–44)
ANION GAP: 10 (ref 5–15)
AST: 39 U/L (ref 15–41)
Alkaline Phosphatase: 57 U/L (ref 38–126)
BUN: 14 mg/dL (ref 6–20)
CHLORIDE: 104 mmol/L (ref 98–111)
CO2: 23 mmol/L (ref 22–32)
Calcium: 9.4 mg/dL (ref 8.9–10.3)
Creatinine, Ser: 1 mg/dL (ref 0.44–1.00)
GFR calc non Af Amer: >60 mL/min (ref >60)
Glucose, Bld: 109 mg/dL — ABNORMAL HIGH (ref 70–99)
POTASSIUM: 4.1 mmol/L (ref 3.5–5.1)
Sodium: 137 mmol/L (ref 135–145)
Total Bilirubin: 1.2 mg/dL (ref 0.3–1.2)
Total Protein: 7.4 g/dL (ref 6.5–8.1)

## 2018-07-18 LAB — LIPASE, BLOOD: LIPASE: 25 U/L (ref 11–51)

## 2018-07-18 MED ORDER — ONDANSETRON HCL 4 MG/2ML IJ SOLN
4.0000 mg | Freq: Once | INTRAMUSCULAR | Status: AC
  Administered 2018-07-18: 4 mg via INTRAVENOUS
  Filled 2018-07-18: qty 2

## 2018-07-18 MED ORDER — MORPHINE SULFATE (PF) 4 MG/ML IV SOLN
4.0000 mg | Freq: Once | INTRAVENOUS | Status: AC
  Administered 2018-07-18: 4 mg via INTRAVENOUS
  Filled 2018-07-18: qty 1

## 2018-07-18 MED ORDER — ALUM & MAG HYDROXIDE-SIMETH 200-200-20 MG/5ML PO SUSP
15.0000 mL | Freq: Once | ORAL | Status: AC
  Administered 2018-07-18: 15 mL via ORAL
  Filled 2018-07-18: qty 30

## 2018-07-18 MED ORDER — SODIUM CHLORIDE 0.9 % IV BOLUS
1000.0000 mL | Freq: Once | INTRAVENOUS | Status: AC
  Administered 2018-07-18: 1000 mL via INTRAVENOUS

## 2018-07-18 NOTE — ED Triage Notes (Signed)
Pt c/o abdominal pain that radiates to her chest, nausea, vomiting, and diarrhea that started this am.

## 2018-07-18 NOTE — Discharge Instructions (Signed)
Follow up with your PCP.  Return for inability to eat or drink, persistent fever >5 days, sudden worsening abdominal pain.  Try imodium for diarrhea
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
## 2018-07-18 NOTE — ED Notes (Signed)
Attempted labs x 2.

## 2018-11-15 ENCOUNTER — Emergency Department (HOSPITAL_COMMUNITY): Payer: BLUE CROSS/BLUE SHIELD

## 2018-11-15 ENCOUNTER — Encounter (HOSPITAL_COMMUNITY): Payer: Self-pay | Admitting: Emergency Medicine

## 2018-11-15 ENCOUNTER — Other Ambulatory Visit: Payer: Self-pay

## 2018-11-15 ENCOUNTER — Observation Stay (HOSPITAL_COMMUNITY)
Admission: EM | Admit: 2018-11-15 | Discharge: 2018-11-16 | Disposition: A | Discharge status: Home / Self Care | Payer: BLUE CROSS/BLUE SHIELD | Attending: Internal Medicine | Admitting: Internal Medicine

## 2018-11-15 DIAGNOSIS — J441 Chronic obstructive pulmonary disease with (acute) exacerbation: Secondary | ICD-10-CM | POA: Diagnosis not present

## 2018-11-15 DIAGNOSIS — Z79899 Other long term (current) drug therapy: Secondary | ICD-10-CM | POA: Insufficient documentation

## 2018-11-15 DIAGNOSIS — R079 Chest pain, unspecified: Secondary | ICD-10-CM

## 2018-11-15 DIAGNOSIS — Z87891 Personal history of nicotine dependence: Secondary | ICD-10-CM | POA: Insufficient documentation

## 2018-11-15 DIAGNOSIS — F329 Major depressive disorder, single episode, unspecified: Secondary | ICD-10-CM | POA: Diagnosis not present

## 2018-11-15 DIAGNOSIS — R0600 Dyspnea, unspecified: Secondary | ICD-10-CM

## 2018-11-15 DIAGNOSIS — F32A Depression, unspecified: Secondary | ICD-10-CM | POA: Diagnosis present

## 2018-11-15 DIAGNOSIS — F419 Anxiety disorder, unspecified: Secondary | ICD-10-CM | POA: Insufficient documentation

## 2018-11-15 DIAGNOSIS — J189 Pneumonia, unspecified organism: Principal | ICD-10-CM

## 2018-11-15 DIAGNOSIS — Z20828 Contact with and (suspected) exposure to other viral communicable diseases: Secondary | ICD-10-CM | POA: Diagnosis not present

## 2018-11-15 DIAGNOSIS — Z8249 Family history of ischemic heart disease and other diseases of the circulatory system: Secondary | ICD-10-CM | POA: Diagnosis not present

## 2018-11-15 DIAGNOSIS — J440 Chronic obstructive pulmonary disease with acute lower respiratory infection: Secondary | ICD-10-CM | POA: Insufficient documentation

## 2018-11-15 LAB — CBC WITH DIFFERENTIAL/PLATELET
Abs Immature Granulocytes: 0.03 10*3/uL (ref 0.00–0.07)
Basophils Absolute: 0.1 10*3/uL (ref 0.0–0.1)
Basophils Relative: 1 %
Eosinophils Absolute: 0.4 10*3/uL (ref 0.0–0.5)
Eosinophils Relative: 5 %
HCT: 41.1 % (ref 36.0–46.0)
Hemoglobin: 13.9 g/dL (ref 12.0–15.0)
Immature Granulocytes: 0 %
Lymphocytes Relative: 32 %
Lymphs Abs: 2.5 10*3/uL (ref 0.7–4.0)
MCH: 29.8 pg (ref 26.0–34.0)
MCHC: 33.8 g/dL (ref 30.0–36.0)
MCV: 88.2 fL (ref 80.0–100.0)
Monocytes Absolute: 0.4 10*3/uL (ref 0.1–1.0)
Monocytes Relative: 5 %
Neutro Abs: 4.3 10*3/uL (ref 1.7–7.7)
Neutrophils Relative %: 57 %
Platelets: 257 10*3/uL (ref 150–400)
RBC: 4.66 MIL/uL (ref 3.87–5.11)
RDW: 12.2 % (ref 11.5–15.5)
WBC: 7.7 10*3/uL (ref 4.0–10.5)
nRBC: 0 % (ref 0.0–0.2)

## 2018-11-15 LAB — FERRITIN: Ferritin: 82 ng/mL (ref 11–307)

## 2018-11-15 LAB — LACTIC ACID, PLASMA
Lactic Acid, Venous: 2.8 mmol/L (ref 0.5–1.9)
Lactic Acid, Venous: 0.6 mmol/L (ref 0.5–1.9)

## 2018-11-15 LAB — I-STAT BETA HCG BLOOD, ED (MC, WL, AP ONLY): I-stat hCG, quantitative: <5 m[IU]/mL (ref <5)

## 2018-11-15 LAB — RAPID URINE DRUG SCREEN, HOSP PERFORMED
Amphetamines: NOT DETECTED
Barbiturates: NOT DETECTED
Benzodiazepines: NOT DETECTED
Cocaine: NOT DETECTED
Opiates: NOT DETECTED
Tetrahydrocannabinol: NOT DETECTED

## 2018-11-15 LAB — ACETAMINOPHEN LEVEL: Acetaminophen (Tylenol), Serum: <10 ug/mL — ABNORMAL LOW (ref 10–30)

## 2018-11-15 LAB — LACTATE DEHYDROGENASE: LDH: 173 U/L (ref 98–192)

## 2018-11-15 LAB — BASIC METABOLIC PANEL
Anion gap: 12 (ref 5–15)
BUN: 14 mg/dL (ref 6–20)
CO2: 18 mmol/L — ABNORMAL LOW (ref 22–32)
Calcium: 9.3 mg/dL (ref 8.9–10.3)
Chloride: 105 mmol/L (ref 98–111)
Creatinine, Ser: 1.16 mg/dL — ABNORMAL HIGH (ref 0.44–1.00)
GFR calc Af Amer: >60 mL/min (ref >60)
GFR calc non Af Amer: 59 mL/min — ABNORMAL LOW (ref >60)
Glucose, Bld: 115 mg/dL — ABNORMAL HIGH (ref 70–99)
Potassium: 3.8 mmol/L (ref 3.5–5.1)
Sodium: 135 mmol/L (ref 135–145)

## 2018-11-15 LAB — POCT I-STAT EG7
Acid-base deficit: 1 mmol/L (ref 0.0–2.0)
Bicarbonate: 21.3 mmol/L (ref 20.0–28.0)
Calcium, Ion: 1.08 mmol/L — ABNORMAL LOW (ref 1.15–1.40)
HCT: 35 % — ABNORMAL LOW (ref 36.0–46.0)
Hemoglobin: 11.9 g/dL — ABNORMAL LOW (ref 12.0–15.0)
O2 Saturation: 97 %
Potassium: 3.5 mmol/L (ref 3.5–5.1)
Sodium: 138 mmol/L (ref 135–145)
TCO2: 22 mmol/L (ref 22–32)
pCO2, Ven: 26.9 mmHg — ABNORMAL LOW (ref 44.0–60.0)
pH, Ven: 7.508 — ABNORMAL HIGH (ref 7.250–7.430)
pO2, Ven: 78 mmHg — ABNORMAL HIGH (ref 32.0–45.0)

## 2018-11-15 LAB — TROPONIN I: Troponin I: <0.03 ng/mL (ref <0.03)

## 2018-11-15 LAB — FIBRINOGEN: Fibrinogen: 278 mg/dL (ref 210–475)

## 2018-11-15 LAB — TRIGLYCERIDES: Triglycerides: 57 mg/dL (ref <150)

## 2018-11-15 LAB — SALICYLATE LEVEL: Salicylate Lvl: <7 mg/dL (ref 2.8–30.0)

## 2018-11-15 LAB — PROCALCITONIN: Procalcitonin: <0.1 ng/mL

## 2018-11-15 LAB — CBG MONITORING, ED: Glucose-Capillary: 115 mg/dL — ABNORMAL HIGH (ref 70–99)

## 2018-11-15 LAB — I-STAT CREATININE, ED: Creatinine, Ser: 1 mg/dL (ref 0.44–1.00)

## 2018-11-15 LAB — D-DIMER, QUANTITATIVE (NOT AT ARMC): D-Dimer, Quant: 0.32 ug/mL-FEU (ref 0.00–0.50)

## 2018-11-15 LAB — SARS CORONAVIRUS 2 BY RT PCR (HOSPITAL ORDER, PERFORMED IN ~~LOC~~ HOSPITAL LAB): SARS Coronavirus 2: NEGATIVE

## 2018-11-15 LAB — C-REACTIVE PROTEIN: CRP: <0.8 mg/dL (ref <1.0)

## 2018-11-15 MED ORDER — SODIUM CHLORIDE 0.9 % IV SOLN
1.0000 g | INTRAVENOUS | Status: DC
  Administered 2018-11-16: 1 g via INTRAVENOUS
  Filled 2018-11-15 (×2): qty 10

## 2018-11-15 MED ORDER — ENOXAPARIN SODIUM 40 MG/0.4ML ~~LOC~~ SOLN
40.0000 mg | SUBCUTANEOUS | Status: DC
  Administered 2018-11-15: 40 mg via SUBCUTANEOUS
  Filled 2018-11-15: qty 0.4

## 2018-11-15 MED ORDER — SODIUM CHLORIDE 0.9 % IV SOLN
1.0000 g | Freq: Once | INTRAVENOUS | Status: AC
  Administered 2018-11-15: 08:00:00 1 g via INTRAVENOUS
  Filled 2018-11-15: qty 10

## 2018-11-15 MED ORDER — BUPROPION HCL ER (XL) 300 MG PO TB24
300.0000 mg | ORAL_TABLET | Freq: Every day | ORAL | Status: DC
  Administered 2018-11-15 – 2018-11-16 (×2): 300 mg via ORAL
  Filled 2018-11-15: qty 2
  Filled 2018-11-15: qty 1

## 2018-11-15 MED ORDER — SODIUM CHLORIDE 0.9 % IV SOLN
500.0000 mg | INTRAVENOUS | Status: DC
  Administered 2018-11-16: 500 mg via INTRAVENOUS
  Filled 2018-11-15 (×2): qty 500

## 2018-11-15 MED ORDER — KETOROLAC TROMETHAMINE 15 MG/ML IJ SOLN
15.0000 mg | Freq: Once | INTRAMUSCULAR | Status: AC
  Administered 2018-11-15: 15 mg via INTRAVENOUS
  Filled 2018-11-15: qty 1

## 2018-11-15 MED ORDER — FLUOXETINE HCL 20 MG PO CAPS
40.0000 mg | ORAL_CAPSULE | Freq: Every day | ORAL | Status: DC
  Administered 2018-11-15 – 2018-11-16 (×2): 40 mg via ORAL
  Filled 2018-11-15 (×2): qty 2

## 2018-11-15 MED ORDER — SODIUM CHLORIDE 0.9 % IV SOLN
INTRAVENOUS | Status: DC
  Administered 2018-11-15: 75 mL/h via INTRAVENOUS
  Administered 2018-11-16: 06:00:00 via INTRAVENOUS

## 2018-11-15 MED ORDER — SODIUM CHLORIDE 0.9 % IV SOLN
500.0000 mg | Freq: Once | INTRAVENOUS | Status: AC
  Administered 2018-11-15: 500 mg via INTRAVENOUS
  Filled 2018-11-15: qty 500

## 2018-11-15 MED ORDER — ZOLPIDEM TARTRATE 5 MG PO TABS
10.0000 mg | ORAL_TABLET | ORAL | Status: DC
  Administered 2018-11-15: 10 mg via ORAL
  Filled 2018-11-15: qty 2

## 2018-11-15 MED ORDER — TRAZODONE HCL 50 MG PO TABS
50.0000 mg | ORAL_TABLET | ORAL | Status: DC
  Administered 2018-11-15: 50 mg via ORAL
  Filled 2018-11-15 (×2): qty 1

## 2018-11-15 MED ORDER — FLUOXETINE HCL 20 MG PO TABS
40.0000 mg | ORAL_TABLET | Freq: Every day | ORAL | Status: DC
  Filled 2018-11-15: qty 2

## 2018-11-15 MED ORDER — IOHEXOL 350 MG/ML SOLN
75.0000 mL | Freq: Once | INTRAVENOUS | Status: AC | PRN
  Administered 2018-11-15: 75 mL via INTRAVENOUS

## 2018-11-15 MED ORDER — CLONAZEPAM 1 MG PO TABS
1.0000 mg | ORAL_TABLET | Freq: Every evening | ORAL | Status: DC
  Administered 2018-11-15: 1 mg via ORAL
  Filled 2018-11-15: qty 1

## 2018-11-15 MED ORDER — LORATADINE 10 MG PO TABS
10.0000 mg | ORAL_TABLET | Freq: Every day | ORAL | Status: DC
  Administered 2018-11-15 – 2018-11-16 (×2): 10 mg via ORAL
  Filled 2018-11-15 (×2): qty 1
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
## 2018-11-15 NOTE — ED Notes (Signed)
Attempted report 

## 2018-11-15 NOTE — ED Notes (Signed)
Patient transported to X-ray 

## 2018-11-15 NOTE — ED Notes (Signed)
Delay in lab draw,  Pt not in room at this time. 

## 2018-11-15 NOTE — ED Notes (Signed)
Spoke to pt via room phone about plans to be transferred upstairs, offered snack (declined), denied any other needs/concerns

## 2018-11-15 NOTE — ED Triage Notes (Signed)
Pt reports SOB, Cough, Fevers, Weakness, that started approximately 1 hour ago. Pt dyspneic on arrival. Mask placed on pt and precautions started.

## 2018-11-15 NOTE — ED Notes (Signed)
Error, CT Angio Chest not completed. Accidentally checked off.
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
## 2018-11-15 NOTE — ED Notes (Signed)
Ophelia Charter, Md at bedside informed of Lactic acid 2.8
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
## 2018-11-16 DIAGNOSIS — J441 Chronic obstructive pulmonary disease with (acute) exacerbation: Secondary | ICD-10-CM

## 2018-11-16 LAB — CBC WITH DIFFERENTIAL/PLATELET
Abs Immature Granulocytes: 0.02 10*3/uL (ref 0.00–0.07)
Basophils Absolute: 0.1 10*3/uL (ref 0.0–0.1)
Basophils Relative: 1 %
Eosinophils Absolute: 0.6 10*3/uL — ABNORMAL HIGH (ref 0.0–0.5)
Eosinophils Relative: 8 %
HCT: 38.3 % (ref 36.0–46.0)
Hemoglobin: 12.8 g/dL (ref 12.0–15.0)
Immature Granulocytes: 0 %
Lymphocytes Relative: 29 %
Lymphs Abs: 2.5 10*3/uL (ref 0.7–4.0)
MCH: 30 pg (ref 26.0–34.0)
MCHC: 33.4 g/dL (ref 30.0–36.0)
MCV: 89.7 fL (ref 80.0–100.0)
Monocytes Absolute: 0.5 10*3/uL (ref 0.1–1.0)
Monocytes Relative: 5 %
Neutro Abs: 4.8 10*3/uL (ref 1.7–7.7)
Neutrophils Relative %: 57 %
Platelets: 192 10*3/uL (ref 150–400)
RBC: 4.27 MIL/uL (ref 3.87–5.11)
RDW: 12.4 % (ref 11.5–15.5)
WBC: 8.5 10*3/uL (ref 4.0–10.5)
nRBC: 0 % (ref 0.0–0.2)

## 2018-11-16 LAB — BASIC METABOLIC PANEL
Anion gap: 8 (ref 5–15)
BUN: 13 mg/dL (ref 6–20)
CO2: 22 mmol/L (ref 22–32)
Calcium: 8.9 mg/dL (ref 8.9–10.3)
Chloride: 107 mmol/L (ref 98–111)
Creatinine, Ser: 1.05 mg/dL — ABNORMAL HIGH (ref 0.44–1.00)
GFR calc Af Amer: >60 mL/min (ref >60)
GFR calc non Af Amer: >60 mL/min (ref >60)
Glucose, Bld: 101 mg/dL — ABNORMAL HIGH (ref 70–99)
Potassium: 4.5 mmol/L (ref 3.5–5.1)
Sodium: 137 mmol/L (ref 135–145)

## 2018-11-16 MED ORDER — AZITHROMYCIN 250 MG PO TABS: ORAL_TABLET | ORAL | 0 refills | Status: AC

## 2018-11-16 MED ORDER — LORAZEPAM 1 MG PO TABS
1.0000 mg | ORAL_TABLET | Freq: Once | ORAL | Status: AC
  Administered 2018-11-16: 1 mg via ORAL
  Filled 2018-11-16: qty 1

## 2018-11-16 NOTE — Discharge Instructions (Signed)
Chest Wall Pain Chest wall pain is pain in or around the bones and muscles of your chest. Chest wall pain may be caused by:  An injury.  Coughing a lot.  Using your chest and arm muscles too much. Sometimes, the cause may not be known. This pain may take a few weeks or longer to get better. Follow these instructions at home: Managing pain, stiffness, and swelling If told, put ice on the painful area:  Put ice in a plastic bag.  Place a towel between your skin and the bag.  Leave the ice on for 20 minutes, 2-3 times a day.  Activity  Rest as told by your doctor.  Avoid doing things that cause pain. This includes lifting heavy items.  Ask your doctor what activities are safe for you. General instructions   Take over-the-counter and prescription medicines only as told by your doctor.  Do not use any products that contain nicotine or tobacco, such as cigarettes, e-cigarettes, and chewing tobacco. If you need help quitting, ask your doctor.  Keep all follow-up visits as told by your doctor. This is important. Contact a doctor if:  You have a fever.  Your chest pain gets worse.  You have new symptoms. Get help right away if:  You feel sick to your stomach (nauseous) or you throw up (vomit).  You feel sweaty or light-headed.  You have a cough with mucus from your lungs (sputum) or you cough up blood.  You are short of breath. These symptoms may be an emergency. Do not wait to see if the symptoms will go away. Get medical help right away. Call your local emergency services (911 in the [place] Do not drive yourself to the hospital. Summary  Chest wall pain is pain in or around the bones and muscles of your chest.  It may be treated with ice, rest, and medicines. Your condition may also get better if you avoid doing things that cause pain.  Contact a doctor if you have a fever, chest pain that gets worse, or new symptoms.  Get help right away if you feel light-headed  or you get short of breath. These symptoms may be an emergency. This information is not intended to replace advice given to you by your health care provider. Make sure you discuss any questions you have with your health care provider. Document Released: 12/31/2007 Document Revised: 01/14/2018 Document Reviewed: 01/14/2018 Elsevier Interactive Patient Education  2019 [employer].   Shortness of Breath, Adult Shortness of breath means you have trouble breathing. Shortness of breath could be a sign of a medical problem. Follow these instructions at home:   Watch for any changes in your symptoms.  Do not use any products that contain nicotine or tobacco, such as cigarettes, e-cigarettes, and chewing tobacco.  Do not smoke. Smoking can cause shortness of breath. If you need help to quit smoking, ask your doctor.  Avoid things that can make it harder to breathe, such as: ? Mold. ? Dust. ? Air pollution. ? Chemical smells. ? Things that can cause allergy symptoms (allergens), if you have allergies.  Keep your living space clean. Use products that help remove mold and dust.  Rest as needed. Slowly return to your normal activities.  Take over-the-counter and prescription medicines only as told by your doctor. This includes oxygen therapy and inhaled medicines.  Keep all follow-up visits as told by your doctor. This is important. Contact a doctor if:  Your condition does not get better  as soon as expected.  You have a hard time doing your normal activities, even after you rest.  You have new symptoms. Get help right away if:  Your shortness of breath gets worse.  You have trouble breathing when you are resting.  You feel light-headed or you pass out (faint).  You have a cough that is not helped by medicines.  You cough up blood.  You have pain with breathing.  You have pain in your chest, arms, shoulders, or belly (abdomen).  You have a fever.  You cannot walk up  stairs.  You cannot exercise the way you normally do. These symptoms may represent a serious problem that is an emergency. Do not wait to see if the symptoms will go away. Get medical help right away. Call your local emergency services (911 in the [place] Do not drive yourself to the hospital. Summary  Shortness of breath is when you have trouble breathing enough air. It can be a sign of a medical problem.  Avoid things that make it hard for you to breathe, such as smoking, pollution, mold, and dust.  Watch for any changes in your symptoms. Contact your doctor if you do not get better or you get worse. This information is not intended to replace advice given to you by your health care provider. Make sure you discuss any questions you have with your health care provider. Document Released: 12/31/2007 Document Revised: 12/14/2017 Document Reviewed: 12/14/2017 Elsevier Interactive Patient Education  2019 [employer] Inc.   Nonspecific Chest Pain Chest pain can be caused by many different conditions. Some causes of chest pain can be life-threatening. These will require treatment right away. Serious causes of chest pain include:  Heart attack.  A tear in the body's main blood vessel.  Redness and swelling (inflammation) around your heart.  Blood clot in your lungs. Other causes of chest pain may not be so serious. These include:  Heartburn.  Anxiety or stress.  Damage to bones or muscles in your chest.  Lung infections. Chest pain can feel like:  Pain or discomfort in your chest.  Crushing, pressure, aching, or squeezing pain.  Burning or tingling.  Dull or sharp pain that is worse when you move, cough, or take a deep breath.  Pain or discomfort that is also felt in your back, neck, jaw, shoulder, or arm, or pain that spreads to any of these areas. It is hard to know whether your pain is caused by something that is serious or something that is not so serious. So it is important  to see your doctor right away if you have chest pain. Follow these instructions at home: Medicines  Take over-the-counter and prescription medicines only as told by your doctor.  If you were prescribed an antibiotic medicine, take it as told by your doctor. Do not stop taking the antibiotic even if you start to feel better. Lifestyle   Rest as told by your doctor.  Do not use any products that contain nicotine or tobacco, such as cigarettes, e-cigarettes, and chewing tobacco. If you need help quitting, ask your doctor.  Do not drink alcohol.  Make lifestyle changes as told by your doctor. These may include: ? Getting regular exercise. Ask your doctor what activities are safe for you. ? Eating a heart-healthy diet. A diet and nutrition specialist (dietitian) can help you to learn healthy eating options. ? Staying at a healthy weight. ? Treating diabetes or high blood pressure, if needed. ? Lowering your stress.  Activities such as yoga and relaxation techniques can help. General instructions  Pay attention to any changes in your symptoms. Tell your doctor about them or any new symptoms.  Avoid any activities that cause chest pain.  Keep all follow-up visits as told by your doctor. This is important. You may need more testing if your chest pain does not go away. Contact a doctor if:  Your chest pain does not go away.  You feel depressed.  You have a fever. Get help right away if:  Your chest pain is worse.  You have a cough that gets worse, or you cough up blood.  You have very bad (severe) pain in your belly (abdomen).  You pass out (faint).  You have either of these for no clear reason: ? Sudden chest discomfort. ? Sudden discomfort in your arms, back, neck, or jaw.  You have shortness of breath at any time.  You suddenly start to sweat, or your skin gets clammy.  You feel sick to your stomach (nauseous).  You throw up (vomit).  You suddenly feel lightheaded  or dizzy.  You feel very weak or tired.  Your heart starts to beat fast, or it feels like it is skipping beats. These symptoms may be an emergency. Do not wait to see if the symptoms will go away. Get medical help right away. Call your local emergency services (911 in the [place] Do not drive yourself to the hospital. Summary  Chest pain can be caused by many different conditions. The cause may be serious and need treatment right away. If you have chest pain, see your doctor right away.  Follow your doctor's instructions for taking medicines and making lifestyle changes.  Keep all follow-up visits as told by your doctor. This includes visits for any further testing if your chest pain does not go away.  Be sure to know the signs that show that your condition has become worse. Get help right away if you have these symptoms. This information is not intended to replace advice given to you by your health care provider. Make sure you discuss any questions you have with your health care provider. Document Released: 12/31/2007 Document Revised: 01/14/2018 Document Reviewed: 01/14/2018 Elsevier Interactive Patient Education  2019 [employer]

## 2018-11-16 NOTE — Discharge Summary (Addendum)
Discharge Summary  Alexis Beckerlizabeth F Sandefur ZOX:[id number] DOB: Dec 11, 1980  PCP: Deatra JamesSun, Vyvyan, MD  Admit date: 11/15/2018 Discharge date: 11/16/2018  Time spent: 35 minutes  Recommendations for Outpatient Follow-up:  1. Follow-up with your PCP 2. Take your medications as prescribed 3. Abstain from tobacco use  Discharge Diagnoses:  Active Hospital Problems   Diagnosis Date Noted   CAP (community acquired pneumonia) 11/15/2018   Depression 08/05/2016    Resolved Hospital Problems  No resolved problems to display.    Discharge Condition: Stable  Diet recommendation: Resume previous diet  Vitals:   11/15/18 2317 11/16/18 0842  BP: 94/79 (!) 98/57  Pulse: 63 67  Resp:  16  Temp: 98.1 F (36.7 C) 98 F (36.7 C)  SpO2: 97% 99%    History of present illness:   Alexis Lindsey is a 38 y.o. female with medical history significant of psychiatric disease presenting with SOB.  She reports feeling well yesterday and having acute onset of SOB about 0400.  She reports "fever" at home to 99-ish.  Some cough.  No known sick contacts.  ED Course:  ?OD but tox negative - sudden onset SOB.  ?track marks.  H/o multifocal PNA and CTA shows this but ?atypical and viral.  High-risk COVID.  No PE.  She is not currently on O2.  Also with 10/10 chest pain.  Extreme tachypnea on arrival, RR high 40s.  CTA chest negative for PE.  Chest x-ray no lobular infiltrates.  COVID-19 testing negative.  Procalcitonin less than 0.10.  O2 saturation 97% on room air.  Lab studies and vital signs reviewed and are stable.  UDS negative.  11/16/18: Patient seen and examined at her bedside.  She states she feels better.  She denies chest pain, palpitations or dyspnea.  Afebrile with no leukocytosis.  On the day of discharge, the patient was hemodynamically stable.  She will need to follow-up with her primary care provider posthospitalization.  She will also need to abstain from tobacco use.  Hospital Course:    Principal Problem:   CAP (community acquired pneumonia) Active Problems:   Depression  COPD exacerbation Currently stable Community-acquired pneumonia and viral pneumonia ruled out No sign of active infective process Afebrile with no leukocytosis Procalcitonin less than 0.10 No infiltrates noted on chest x-ray COVID-19 negative O2 saturation 97% on room air Z-Pak, take as directed on the box x5 days  Resolved chest pain, ACS/PE ruled out CTA PE negative for acute pulmonary embolism Troponin negative Vital signs stable UDS negative  Tobacco use disorder Continue to abstain from smoking Follow-up with your PCP Tobacco cessation counseling  Depression/ADD Continue your home medications Follow-up with your primary care provider   Procedures:  None  Consultations:  None  Discharge Exam: BP (!) 98/57 (BP Location: Left Arm)    Pulse 67    Temp 98 F (36.7 C) (Oral)    Resp 16    Ht 5\' 9"  (1.753 m)    Wt 79.1 kg    SpO2 99%    BMI 25.75 kg/m   General: 38 y.o. year-old female well developed well nourished in no acute distress.  Alert and oriented x3.  Cardiovascular: Regular rate and rhythm with no rubs or gallops.  No thyromegaly or JVD noted.    Respiratory: Clear to auscultation with no wheezes or rales. Good inspiratory effort.  Abdomen: Soft nontender nondistended with normal bowel sounds x4 quadrants.  Musculoskeletal: No lower extremity edema. 2/4 pulses in all 4 extremities.  Skin:  No ulcerative lesions noted or rashes,  Psychiatry: Mood is appropriate for condition and setting  Discharge Instructions You were cared for by a hospitalist during your hospital stay. If you have any questions about your discharge medications or the care you received while you were in the hospital after you are discharged, you can call the unit and asked to speak with the hospitalist on call if the hospitalist that took care of you is not available. Once you are discharged,  your primary care physician will handle any further medical issues. Please note that NO REFILLS for any discharge medications will be authorized once you are discharged, as it is imperative that you return to your primary care physician (or establish a relationship with a primary care physician if you do not have one) for your aftercare needs so that they can reassess your need for medications and monitor your lab values.   Allergies as of 11/16/2018      Reactions   Macrobid [nitrofurantoin Macrocrystal] Other (See Comments)   Nausea and  vomiitng    Codeine Nausea And Vomiting      Medication List    TAKE these medications   azithromycin 250 MG tablet Commonly known as:  Zithromax Z-Pak Take 2 tablets (500 mg) on  Day 1,  followed by 1 tablet (250 mg) once daily on Days 2 through 5.   buPROPion 300 MG 24 hr tablet Commonly known as:  WELLBUTRIN XL Take 300 mg by mouth daily.   clonazePAM 0.5 MG tablet Commonly known as:  KLONOPIN Take 1 mg by mouth every evening.   FLUoxetine 20 MG tablet Commonly known as:  PROZAC Take 40 mg by mouth daily.   levocetirizine 5 MG tablet Commonly known as:  XYZAL Take 5 mg by mouth every evening.   traZODone 50 MG tablet Commonly known as:  DESYREL Take 50 mg by mouth every other day.   Vyvanse 70 MG capsule Generic drug:  lisdexamfetamine Take 70 mg by mouth daily.   zolpidem 10 MG tablet Commonly known as:  AMBIEN Take 10 mg by mouth every other day.      Allergies  Allergen Reactions   Macrobid [Nitrofurantoin Macrocrystal] Other (See Comments)    Nausea and  vomiitng    Codeine Nausea And Vomiting   Follow-up Information    Deatra James, MD. Call in 1 day(s).   Specialty:  Family Medicine Why:  Please call for a post hospital follow-up appointment. Contact information: [address] [place] [address] [phone]            The results of significant diagnostics from this hospitalization  (including imaging, microbiology, ancillary and laboratory) are listed below for reference.    Significant Diagnostic Studies: Ct Angio Chest Pe W And/or Wo Contrast  Result Date: 11/15/2018 CLINICAL DATA:  38 year old female with shortness of breath cough fever and weakness. EXAM: CT ANGIOGRAPHY CHEST WITH CONTRAST TECHNIQUE: Multidetector CT imaging of the chest was performed using the standard protocol during bolus administration of intravenous contrast. Multiplanar CT image reconstructions and MIPs were obtained to evaluate the vascular anatomy. CONTRAST:  56mL OMNIPAQUE IOHEXOL 350 MG/ML SOLN COMPARISON:  Portable chest earlier today. CTA chest 08/05/2016. FINDINGS: Cardiovascular: Good contrast bolus timing in the pulmonary arterial tree. Mild respiratory motion. No focal filling defect identified in the pulmonary arteries to suggest acute pulmonary embolism. Negative visible aorta. No cardiomegaly or pericardial effusion. Mediastinum/Nodes: Negative. Lungs/Pleura: Trace retained secretions dependently in the trachea on series  6, image 31. Major airways otherwise are patent. There is subtle tree-in-bud nodularity suspected in both upper lobes when compared to the 2018 CTA, but no confluent or other abnormal pulmonary opacity. No pleural effusion. Upper Abdomen: Negative visible liver, spleen, pancreas, adrenal glands and bowel. Incidental breast implants. Musculoskeletal: No acute osseous abnormality identified. Chronic T10 superior endplate Schmorl's node. Review of the MIP images confirms the above findings. IMPRESSION: 1. No evidence of acute pulmonary embolus. 2. Subtle tree-in-bud nodularity is possible in both upper lobes when compared to a 2018 CT, but no other abnormal pulmonary opacity. Consider viral/atypical respiratory infection. Electronically Signed   By: Odessa Fleming M.D.   On: 11/15/2018 06:48   Dg Chest Portable 1 View  Result Date: 11/15/2018 CLINICAL DATA:  38 year old female with cough.  EXAM: PORTABLE CHEST 1 VIEW COMPARISON:  Chest radiographs and CT 08/06/2016 and earlier. FINDINGS: Portable AP semi upright view at 0537 hours. Incidental breast implants. Normal lung volumes and mediastinal contours. Visualized tracheal air column is within normal limits. Allowing for portable technique the lungs are clear. No pneumothorax. No osseous abnormality identified. IMPRESSION: No cardiopulmonary abnormality. Electronically Signed   By: Odessa Fleming M.D.   On: 11/15/2018 06:05    Microbiology: Recent Results (from the past 240 hour(s))  SARS Coronavirus 2 [facility] order, Performed in [facility] hospital lab)     Status: None   Collection Time: 11/15/18  7:04 AM  Result Value Ref Range Status   SARS Coronavirus 2 NEGATIVE NEGATIVE Final    Comment: (NOTE) If result is NEGATIVE SARS-CoV-2 target nucleic acids are NOT DETECTED. The SARS-CoV-2 RNA is generally detectable in upper and lower  respiratory specimens during the acute phase of infection. The lowest  concentration of SARS-CoV-2 viral copies this assay can detect is 250  copies / mL. A negative result does not preclude SARS-CoV-2 infection  and should not be used as the sole basis for treatment or other  patient management decisions.  A negative result may occur with  improper specimen collection / handling, submission of specimen other  than nasopharyngeal swab, presence of viral mutation(s) within the  areas targeted by this assay, and inadequate number of viral copies  (<250 copies / mL). A negative result must be combined with clinical  observations, patient history, and epidemiological information. If result is POSITIVE SARS-CoV-2 target nucleic acids are DETECTED. The SARS-CoV-2 RNA is generally detectable in upper and lower  respiratory specimens dur ing the acute phase of infection.  Positive  results are indicative of active infection with SARS-CoV-2.  Clinical  correlation with patient history and other diagnostic  information is  necessary to determine patient infection status.  Positive results do  not rule out bacterial infection or co-infection with other viruses. If result is PRESUMPTIVE POSTIVE SARS-CoV-2 nucleic acids MAY BE PRESENT.   A presumptive positive result was obtained on the submitted specimen  and confirmed on repeat testing.  While 2019 novel coronavirus  (SARS-CoV-2) nucleic acids may be present in the submitted sample  additional confirmatory testing may be necessary for epidemiological  and / or clinical management purposes  to differentiate between  SARS-CoV-2 and other Sarbecovirus currently known to infect humans.  If clinically indicated additional testing with an alternate test  methodology [phone]) is advised. The SARS-CoV-2 RNA is generally  detectable in upper and lower respiratory sp ecimens during the acute  phase of infection. The expected result is Negative. Fact Sheet for Patients:  BoilerBrush.com.cy Fact Sheet for Healthcare  Providers: https://pope.com/ This test is not yet approved or cleared by the [place] and has been authorized for detection and/or diagnosis of SARS-CoV-2 by FDA under an Emergency Use Authorization (EUA).  This EUA will remain in effect (meaning this test can be used) for the duration of the COVID-19 declaration under Section 564(b)(1) of the Act, 21 U.S.C. section 360bbb-3(b)(1), unless the authorization is terminated or revoked sooner. Performed at [facility], [address], [place], [place] [address]      Labs: Basic Metabolic Panel: Recent Labs  Lab 11/15/18 0551 11/15/18 0644 11/15/18 0645 11/16/18 0530  NA 135  --  138 137  K 3.8  --  3.5 4.5  CL 105  --   --  107  CO2 18*  --   --  22  GLUCOSE 115*  --   --  101*  BUN 14  --   --  13  CREATININE 1.16* 1.00  --  1.05*  CALCIUM 9.3  --   --  8.9   Liver Function Tests: No results for input(s): AST,  ALT, ALKPHOS, BILITOT, PROT, ALBUMIN in the last 168 hours. No results for input(s): LIPASE, AMYLASE in the last 168 hours. No results for input(s): AMMONIA in the last 168 hours. CBC: Recent Labs  Lab 11/15/18 0551 11/15/18 0645 11/16/18 0530  WBC 7.7  --  8.5  NEUTROABS 4.3  --  4.8  HGB 13.9 11.9* 12.8  HCT 41.1 35.0* 38.3  MCV 88.2  --  89.7  PLT 257  --  192   Cardiac Enzymes: Recent Labs  Lab 11/15/18 0551  TROPONINI <0.03   BNP: BNP (last 3 results) No results for input(s): BNP in the last 8760 hours.  ProBNP (last 3 results) No results for input(s): PROBNP in the last 8760 hours.  CBG: Recent Labs  Lab 11/15/18 0643  GLUCAP 115*       Signed:  Darlin Drop, MD [facility] 11/16/2018, 9:46 AM

## 2018-11-16 NOTE — Progress Notes (Addendum)
Paged Dr. Clearence Ped via amion to make him aware that patient complains of sharp chest pain that's worsened with breathing and that it's like the pain she had when she came into ED.  Patient complains that she cant sleep due to the pain even after Ambien she has woke up.

## 2018-11-20 LAB — CULTURE, BLOOD (ROUTINE X 2)
Culture: NO GROWTH
Culture: NO GROWTH
Special Requests: ADEQUATE
Special Requests: ADEQUATE

## 2019-08-31 DIAGNOSIS — G43909 Migraine, unspecified, not intractable, without status migrainosus: Secondary | ICD-10-CM | POA: Insufficient documentation

## 2019-09-22 ENCOUNTER — Other Ambulatory Visit: Payer: Self-pay | Admitting: Obstetrics and Gynecology

## 2019-10-16 IMAGING — CT CT ANGIOGRAPHY CHEST
2 of 6 series · 19 of 36 positions shown · IV contrast (omnipaque)
Comparison: Portable chest earlier today. CTA chest 08/05/2016.

CLINICAL DATA: 37-year-old female with shortness of breath cough
fever and weakness.

EXAM:
CT ANGIOGRAPHY CHEST WITH CONTRAST
TECHNIQUE: Multidetector CT imaging of the chest was performed using the
standard protocol during bolus administration of intravenous
contrast. Multiplanar CT image reconstructions and MIPs were
obtained to evaluate the vascular anatomy.
CONTRAST:  75mL OMNIPAQUE IOHEXOL 350 MG/ML SOLN

[Series 7: pe thins · axial · 0.73mm/px · z∈[+1094,+1342]mm · 18 of 394 slices shown]
[im 20/394  lung]
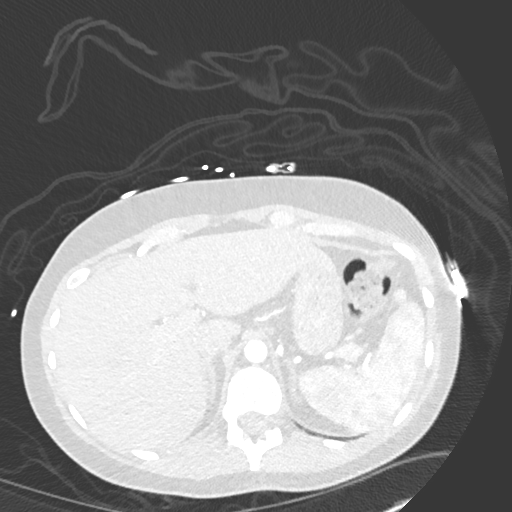
[im 40/394  mediastinal]
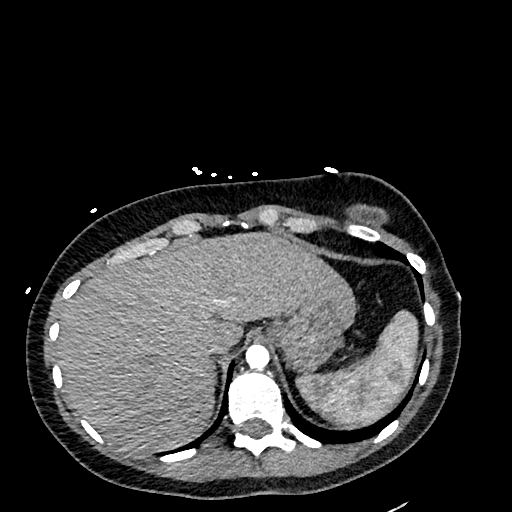
[im 59/394  lung]
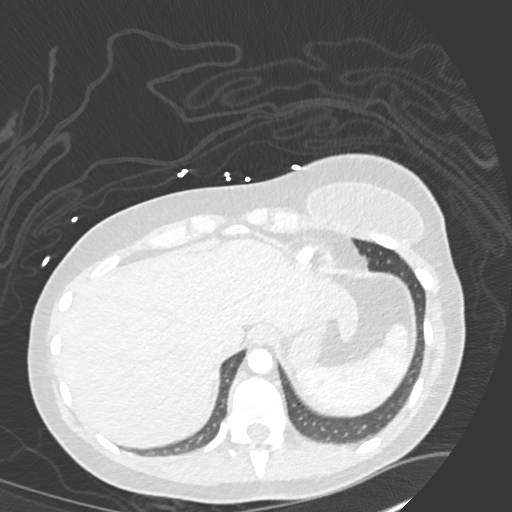
[im 79/394  mediastinal]
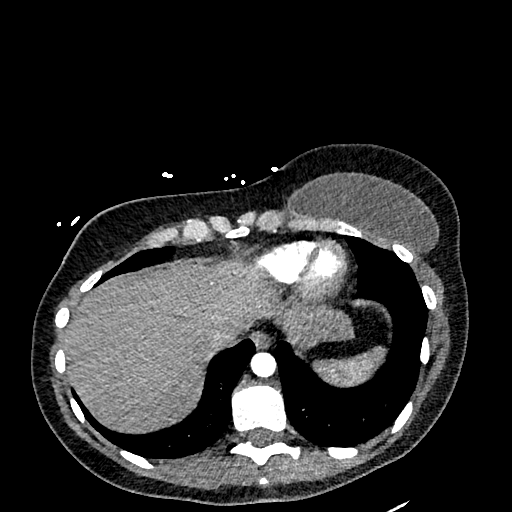
[im 99/394  lung]
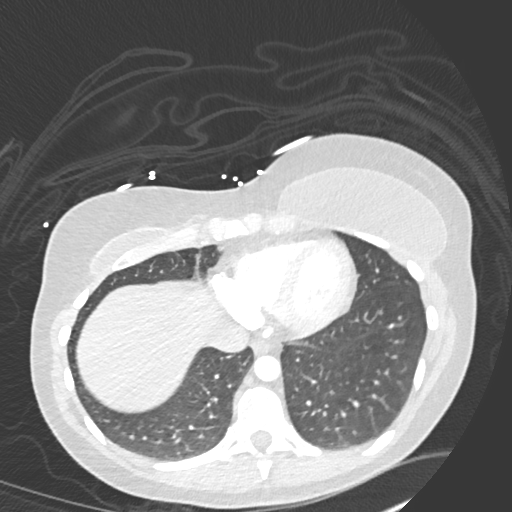
[im 118/394  mediastinal]
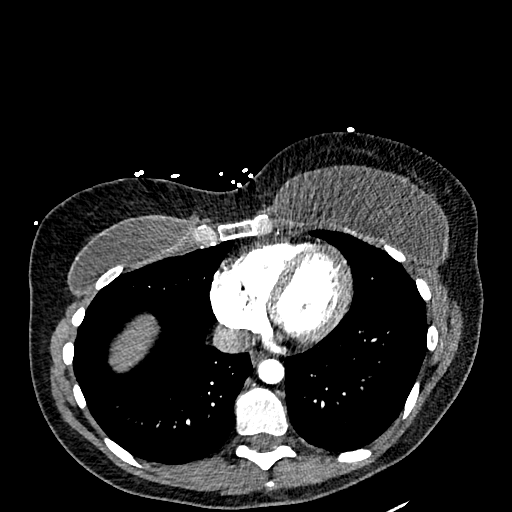
[im 138/394  lung]
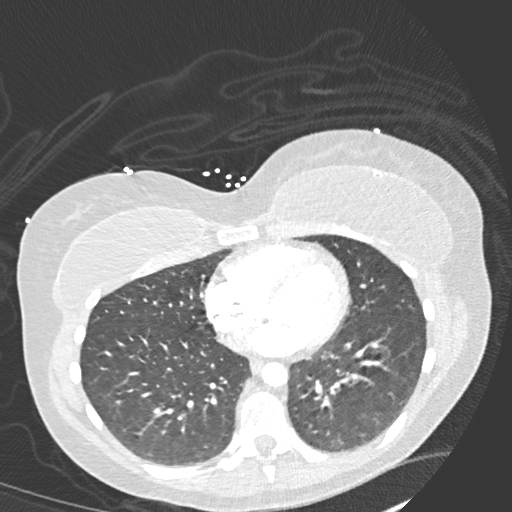
[im 158/394  mediastinal]
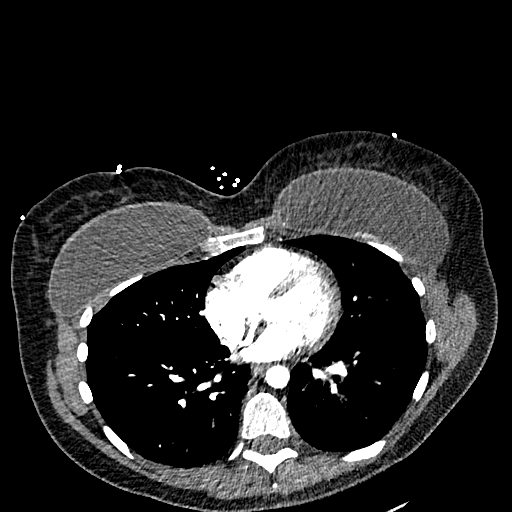
[im 177/394  lung]
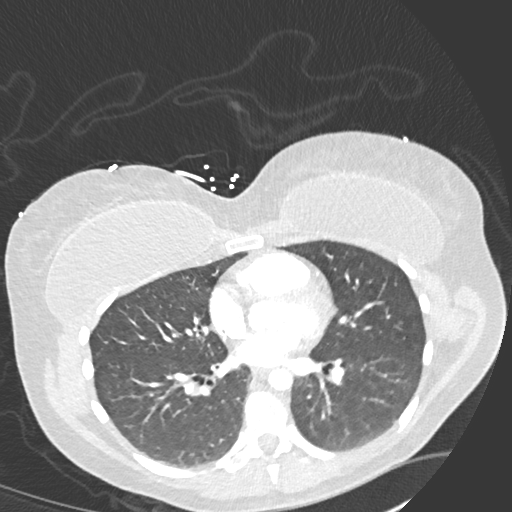
[im 217/394  mediastinal]
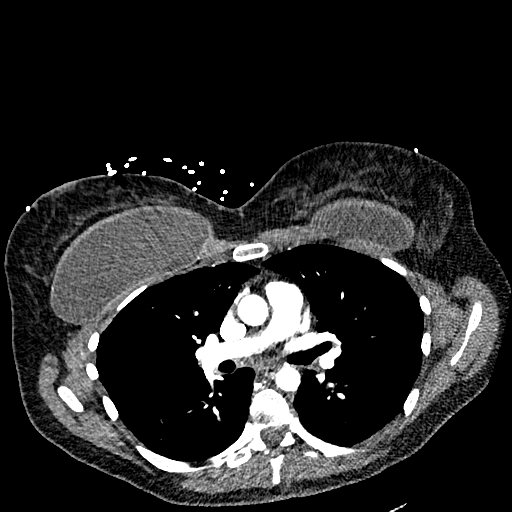
[im 236/394  lung]
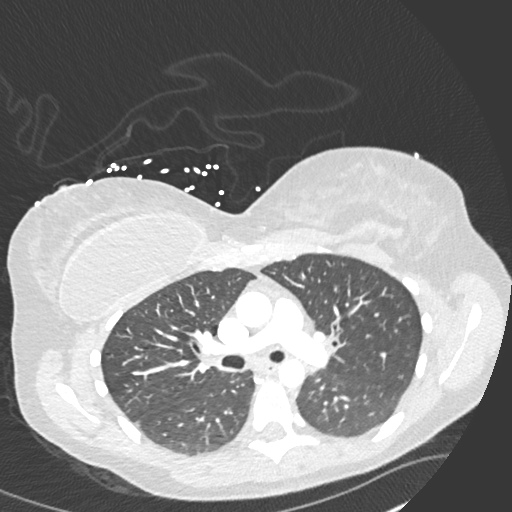
[im 256/394  mediastinal]
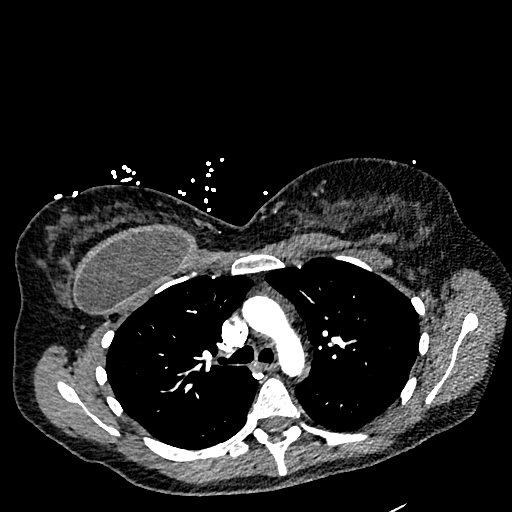
[im 276/394  lung]
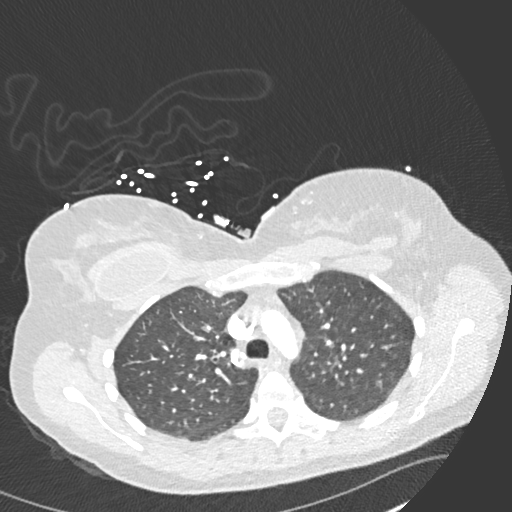
[im 295/394  mediastinal]
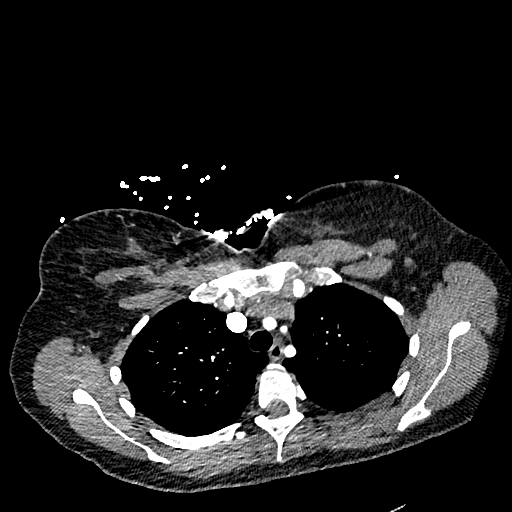
[im 315/394  lung]
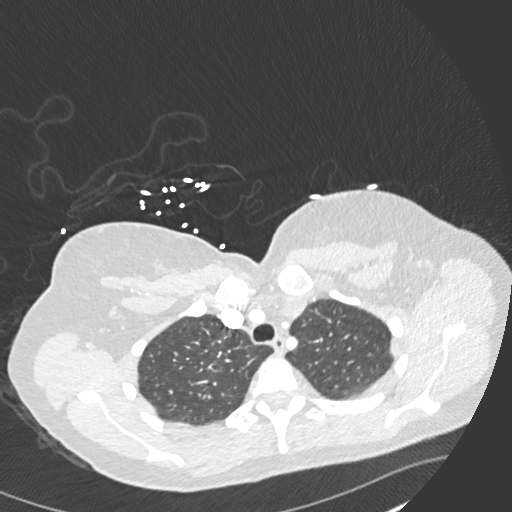
[im 335/394  mediastinal]
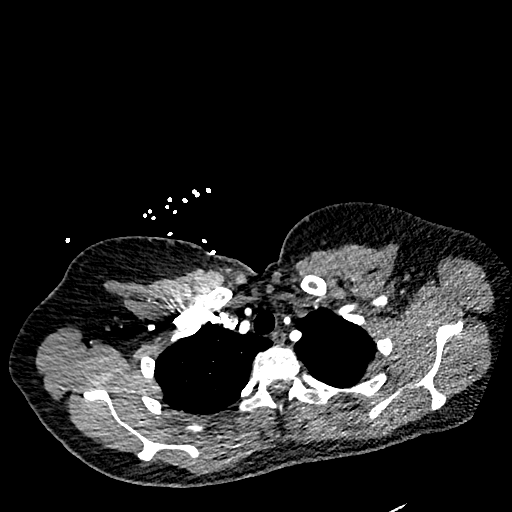
[im 354/394  lung]
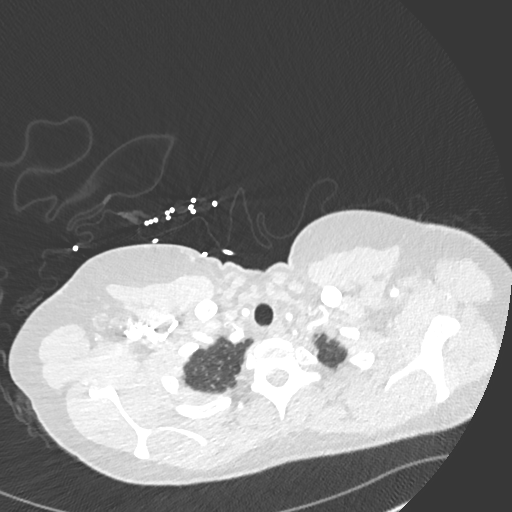
[im 374/394  mediastinal]
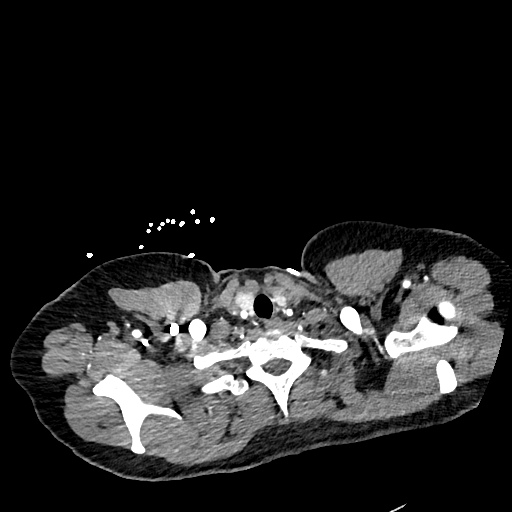

[Series 8: pe 2mm cor · coronal · 0.56mm/px · 1 of 137 slices shown]
[im 69/137  mediastinal]
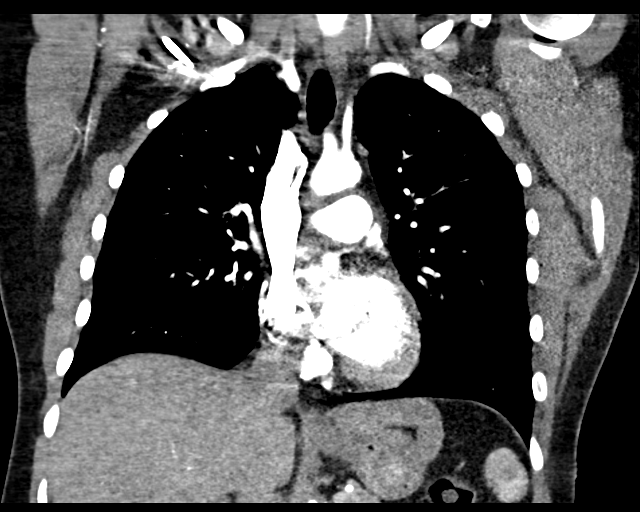

[19 of 36 positions shown; findings below may reference images not displayed]

FINDINGS: Cardiovascular: Good contrast bolus timing in the pulmonary arterial
tree. Mild respiratory motion.

No focal filling defect identified in the pulmonary arteries to
suggest acute pulmonary embolism.

Negative visible aorta. No cardiomegaly or pericardial effusion.

Mediastinum/Nodes: Negative.

Lungs/Pleura: Trace retained secretions dependently in the trachea
on series 6, image 31. Major airways otherwise are patent. There is
subtle tree-in-bud nodularity suspected in both upper lobes when
compared to the 2756 CTA, but no confluent or other abnormal
pulmonary opacity. No pleural effusion.

Upper Abdomen: Negative visible liver, spleen, pancreas, adrenal
glands and bowel. Incidental breast implants.

Musculoskeletal: No acute osseous abnormality identified. Chronic
T10 superior endplate Schmorl's node.

Review of the MIP images confirms the above findings.
IMPRESSION: 1. No evidence of acute pulmonary embolus.
2. Subtle tree-in-bud nodularity is possible in both upper lobes
when compared to a 2756 CT, but no other abnormal pulmonary opacity.
Consider viral/atypical respiratory infection.

## 2020-01-28 ENCOUNTER — Encounter (HOSPITAL_COMMUNITY): Payer: Self-pay | Admitting: *Deleted

## 2020-01-28 ENCOUNTER — Ambulatory Visit (INDEPENDENT_AMBULATORY_CARE_PROVIDER_SITE_OTHER): Payer: BC Managed Care – PPO

## 2020-01-28 ENCOUNTER — Ambulatory Visit (HOSPITAL_COMMUNITY)
Admission: EM | Admit: 2020-01-28 | Discharge: 2020-01-28 | Disposition: A | Discharge status: Home / Self Care | Payer: BC Managed Care – PPO | Attending: Family Medicine | Admitting: Family Medicine

## 2020-01-28 DIAGNOSIS — M25572 Pain in left ankle and joints of left foot: Secondary | ICD-10-CM

## 2020-01-28 MED ORDER — HYDROCODONE-ACETAMINOPHEN 5-325 MG PO TABS
1.0000 | ORAL_TABLET | Freq: Once | ORAL | Status: AC
  Administered 2020-01-28: 1 via ORAL

## 2020-01-28 MED ORDER — HYDROCODONE-ACETAMINOPHEN 5-325 MG PO TABS: 1.0000 | ORAL_TABLET | Freq: Four times a day (QID) | ORAL | 0 refills | Status: DC | PRN

## 2020-01-28 MED ORDER — HYDROCODONE-ACETAMINOPHEN 5-325 MG PO TABS
ORAL_TABLET | ORAL | Status: AC
  Filled 2020-01-28: qty 1

## 2020-01-28 NOTE — ED Triage Notes (Signed)
Pt reports falling approx 12 ft from a tree @ approx 1630; states she landed "on my head", but denies any LOC.  Denies any head or neck pain at this time.  C/O significant left ankle pain.  LLE toes warm, pink with prompt cap refill.

## 2020-01-28 NOTE — Discharge Instructions (Addendum)

## 2020-02-13 ENCOUNTER — Other Ambulatory Visit: Payer: Self-pay

## 2020-02-13 ENCOUNTER — Encounter (HOSPITAL_COMMUNITY): Payer: Self-pay | Admitting: *Deleted

## 2020-02-13 ENCOUNTER — Emergency Department (HOSPITAL_COMMUNITY)
Admission: EM | Admit: 2020-02-13 | Discharge: 2020-02-14 | Disposition: A | Discharge status: Home / Self Care | Payer: BC Managed Care – PPO | Attending: Emergency Medicine | Admitting: Emergency Medicine

## 2020-02-13 DIAGNOSIS — Z5321 Procedure and treatment not carried out due to patient leaving prior to being seen by health care provider: Secondary | ICD-10-CM | POA: Diagnosis not present

## 2020-02-13 DIAGNOSIS — R21 Rash and other nonspecific skin eruption: Secondary | ICD-10-CM | POA: Insufficient documentation

## 2020-02-13 NOTE — ED Triage Notes (Signed)
Pt reports having poison ivy on her neck and chest area. No relief with otc creams and having severe itching. No acute distress is noted at triage.

## 2020-02-13 NOTE — ED Notes (Signed)
Pt is going to try and return in the morning.

## 2020-12-28 IMAGING — DX DG ANKLE COMPLETE 3+V*L*
3 series · 3 of 3 positions shown · non-contrast
Comparison: None.

CLINICAL DATA: Fall from tree, medial ankle pain

EXAM:
LEFT ANKLE COMPLETE - 3+ VIEW

[ankle ap]
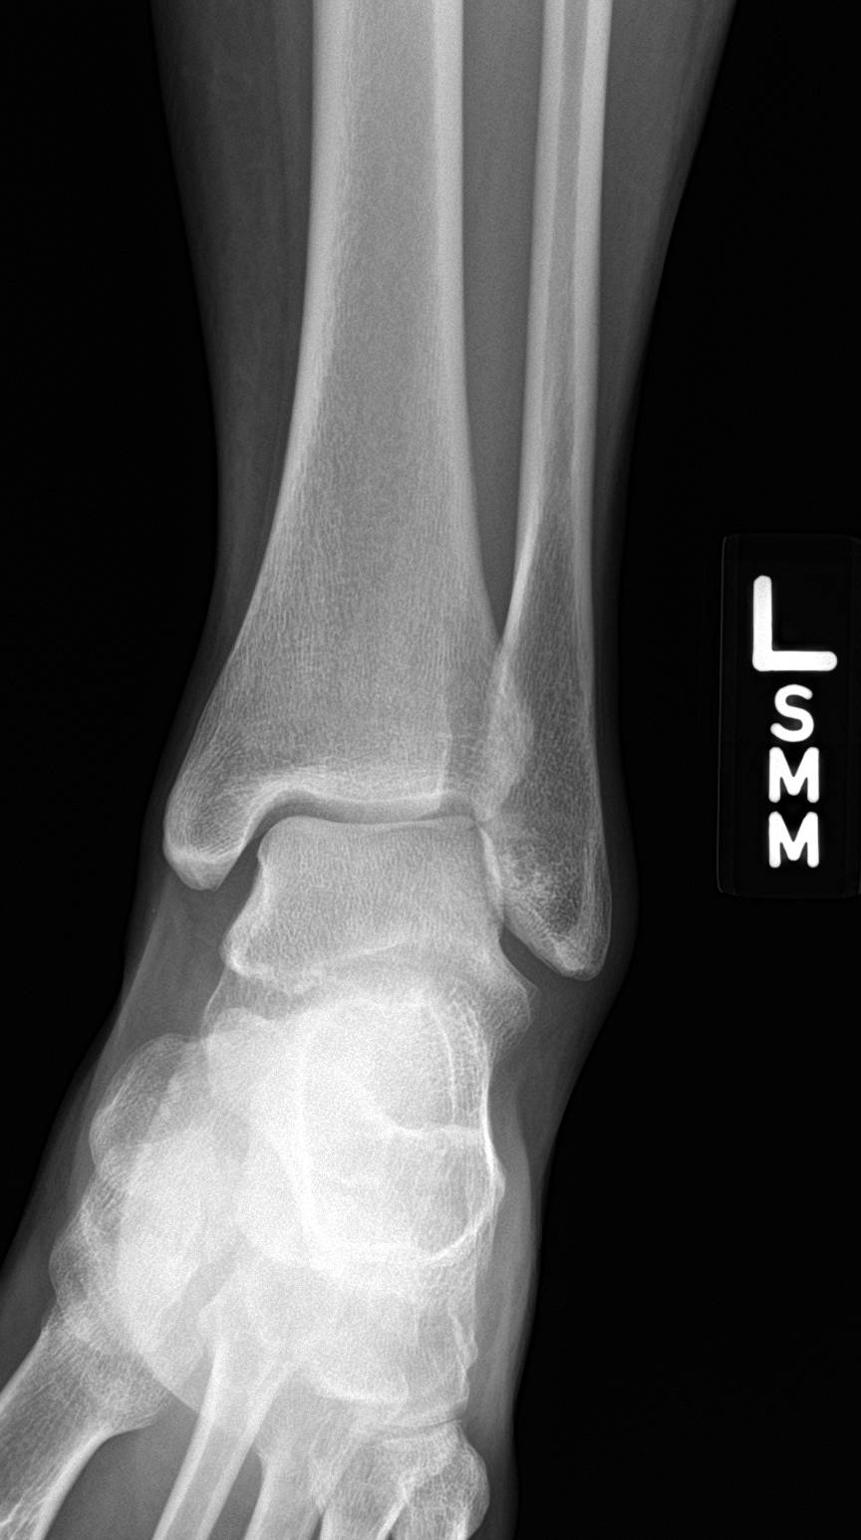

[ankle obl]
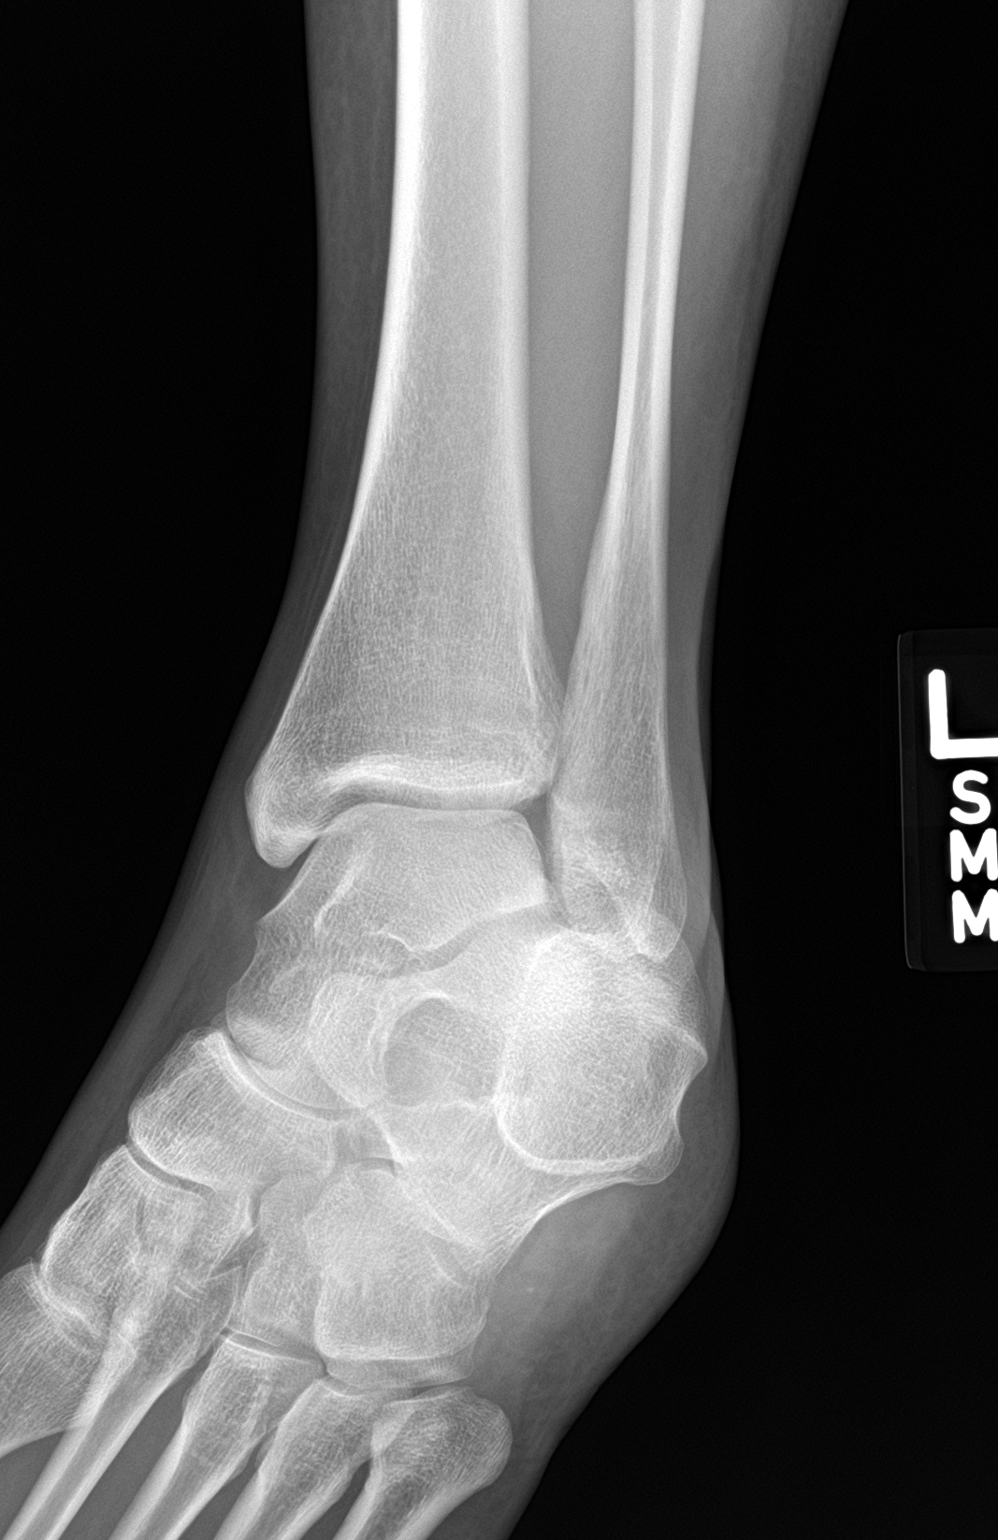

[ankle lat]
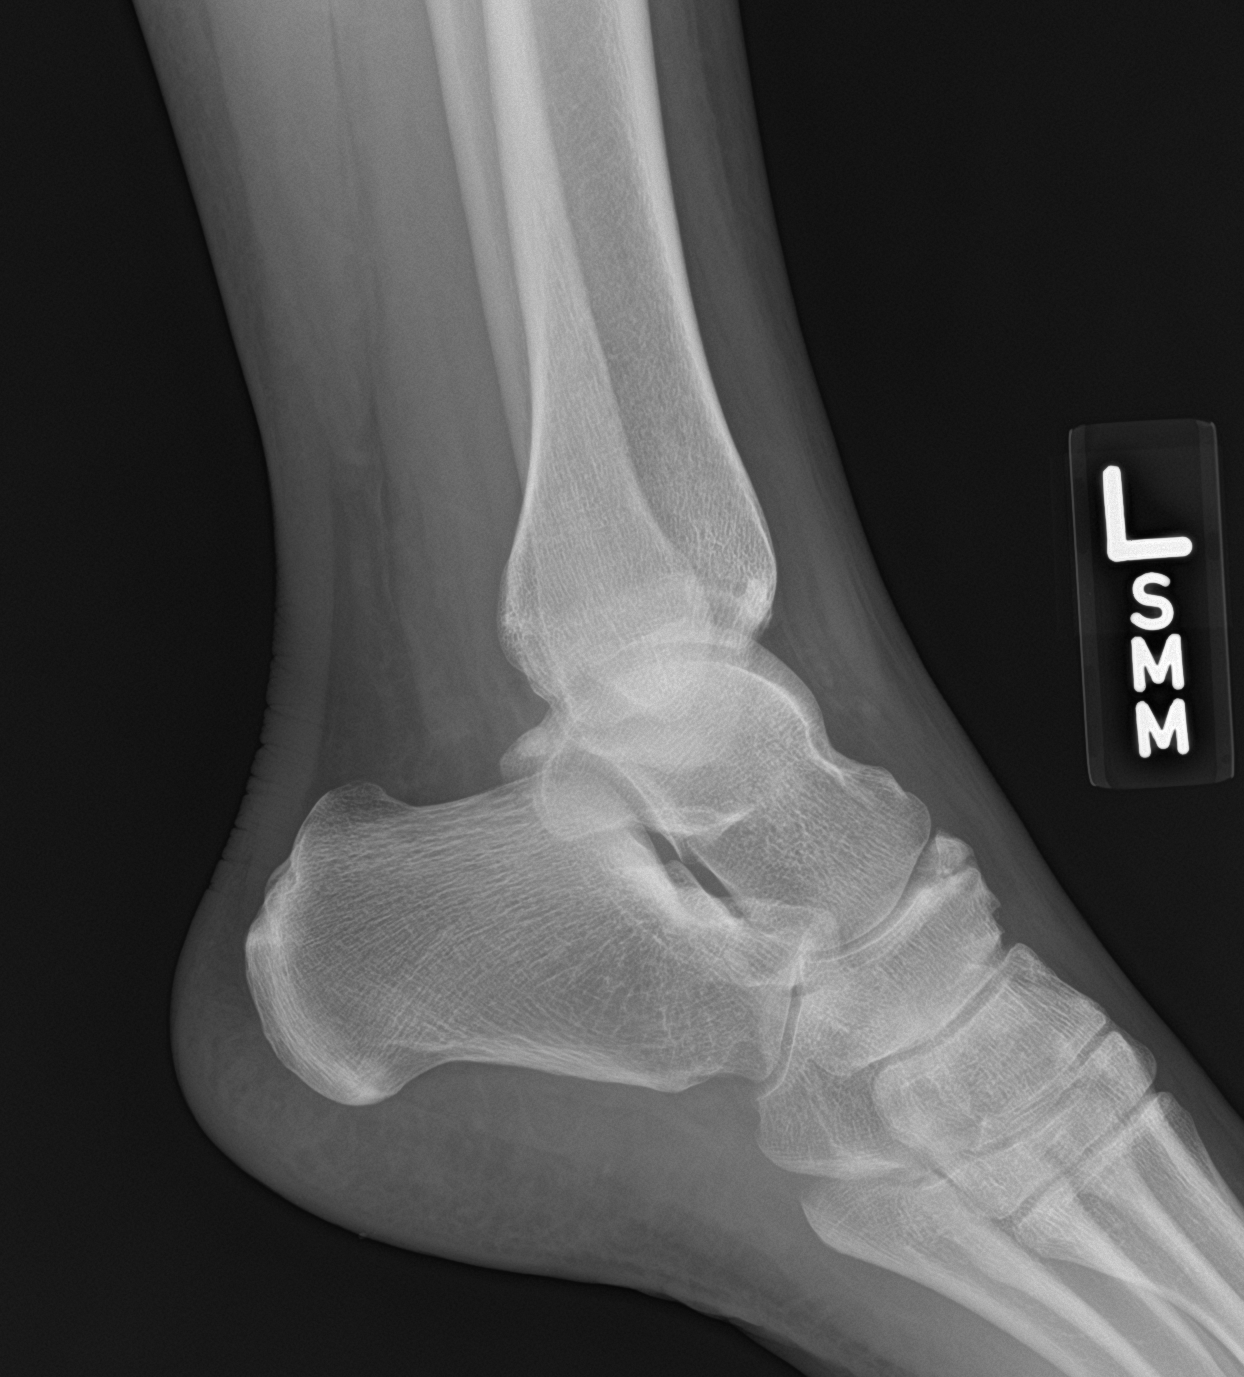

[3 of 3 positions shown; findings below may reference images not displayed]

FINDINGS: There is no evidence of acute fracture, dislocation, or joint
effusion. Probable chronic fracture fragmentation or variant
ossification of the dorsal navicular seen on lateral view. There is
no evidence of arthropathy or other focal bone abnormality. Soft
tissue edema about the anterior and medial ankle.
IMPRESSION: 1. No acute fracture or dislocation of the left ankle. Joint spaces
are preserved.

2. Chronic fracture fragmentation or variant ossification of the
dorsal navicular seen on lateral view.

3.  Soft tissue edema about the anterior and medial ankle.

## 2021-05-06 ENCOUNTER — Other Ambulatory Visit: Payer: Self-pay

## 2021-05-06 ENCOUNTER — Emergency Department (HOSPITAL_BASED_OUTPATIENT_CLINIC_OR_DEPARTMENT_OTHER)
Admission: EM | Admit: 2021-05-06 | Discharge: 2021-05-06 | Disposition: A | Discharge status: Home / Self Care | Payer: BC Managed Care – PPO | Attending: Emergency Medicine | Admitting: Emergency Medicine

## 2021-05-06 DIAGNOSIS — Z79899 Other long term (current) drug therapy: Secondary | ICD-10-CM | POA: Diagnosis not present

## 2021-05-06 DIAGNOSIS — R599 Enlarged lymph nodes, unspecified: Secondary | ICD-10-CM | POA: Insufficient documentation

## 2021-05-06 DIAGNOSIS — J029 Acute pharyngitis, unspecified: Secondary | ICD-10-CM | POA: Diagnosis not present

## 2021-05-06 DIAGNOSIS — Z87891 Personal history of nicotine dependence: Secondary | ICD-10-CM | POA: Diagnosis not present

## 2021-05-06 LAB — GROUP A STREP BY PCR: Group A Strep by PCR: NOT DETECTED

## 2021-05-06 MED ORDER — PENICILLIN G BENZATHINE 1200000 UNIT/2ML IM SUSY
1.2000 10*6.[IU] | PREFILLED_SYRINGE | Freq: Once | INTRAMUSCULAR | Status: AC
  Administered 2021-05-06: 1.2 10*6.[IU] via INTRAMUSCULAR
  Filled 2021-05-06: qty 2

## 2021-05-06 MED ORDER — DEXAMETHASONE SODIUM PHOSPHATE 10 MG/ML IJ SOLN
10.0000 mg | Freq: Once | INTRAMUSCULAR | Status: AC
  Administered 2021-05-06: 10 mg via INTRAMUSCULAR
  Filled 2021-05-06: qty 1

## 2021-05-06 NOTE — ED Triage Notes (Signed)
Pt to ED from home with c/o sore throat x2 days that has progressively gotten worse over the past 12 hours to the point she can no longer swallow food and has painful swallowing. Pt soft palate is swollen and reddened.

## 2021-05-06 NOTE — Discharge Instructions (Addendum)
You were seen today for sore throat.  While your strep testing was negative.  This could be a false positive.  You were treated empirically for strep throat.  Take ibuprofen or Tylenol as needed for pain.  Make sure you are staying hydrated.
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
## 2021-05-10 ENCOUNTER — Emergency Department (HOSPITAL_BASED_OUTPATIENT_CLINIC_OR_DEPARTMENT_OTHER)
Admission: EM | Admit: 2021-05-10 | Discharge: 2021-05-10 | Disposition: A | Discharge status: Home / Self Care | Payer: BC Managed Care – PPO | Attending: Emergency Medicine | Admitting: Emergency Medicine

## 2021-05-10 ENCOUNTER — Encounter (HOSPITAL_BASED_OUTPATIENT_CLINIC_OR_DEPARTMENT_OTHER): Payer: Self-pay

## 2021-05-10 ENCOUNTER — Other Ambulatory Visit: Payer: Self-pay

## 2021-05-10 DIAGNOSIS — J029 Acute pharyngitis, unspecified: Secondary | ICD-10-CM | POA: Insufficient documentation

## 2021-05-10 DIAGNOSIS — Z87891 Personal history of nicotine dependence: Secondary | ICD-10-CM | POA: Insufficient documentation

## 2021-05-10 DIAGNOSIS — B349 Viral infection, unspecified: Secondary | ICD-10-CM | POA: Insufficient documentation

## 2021-05-10 MED ORDER — LIDOCAINE VISCOUS HCL 2 % MT SOLN
15.0000 mL | Freq: Once | OROMUCOSAL | Status: AC
  Administered 2021-05-10: 15 mL via OROMUCOSAL
  Filled 2021-05-10: qty 15

## 2021-05-10 NOTE — ED Triage Notes (Signed)
Patient comes to the ED from home with c/o sorethroat. Patient states she was here Monday and told if it wasn't any better in a few days to return. She thought it was getting better and then the sore throat got worse. Patient reports having slight cough now and when she blows her nose the contents are green.
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
## 2021-08-28 DIAGNOSIS — N939 Abnormal uterine and vaginal bleeding, unspecified: Secondary | ICD-10-CM | POA: Diagnosis not present

## 2021-08-28 DIAGNOSIS — R102 Pelvic and perineal pain: Secondary | ICD-10-CM | POA: Diagnosis not present

## 2021-09-04 ENCOUNTER — Other Ambulatory Visit: Payer: Self-pay | Admitting: Obstetrics and Gynecology

## 2021-09-04 DIAGNOSIS — N858 Other specified noninflammatory disorders of uterus: Secondary | ICD-10-CM | POA: Diagnosis not present

## 2021-09-04 DIAGNOSIS — N9412 Deep dyspareunia: Secondary | ICD-10-CM | POA: Diagnosis not present

## 2021-09-04 DIAGNOSIS — R102 Pelvic and perineal pain: Secondary | ICD-10-CM | POA: Diagnosis not present

## 2021-09-04 DIAGNOSIS — N941 Unspecified dyspareunia: Secondary | ICD-10-CM | POA: Diagnosis not present

## 2021-09-04 DIAGNOSIS — N939 Abnormal uterine and vaginal bleeding, unspecified: Secondary | ICD-10-CM | POA: Diagnosis not present

## 2021-09-11 ENCOUNTER — Emergency Department (HOSPITAL_BASED_OUTPATIENT_CLINIC_OR_DEPARTMENT_OTHER): Payer: BC Managed Care – PPO

## 2021-09-11 ENCOUNTER — Emergency Department (HOSPITAL_BASED_OUTPATIENT_CLINIC_OR_DEPARTMENT_OTHER)
Admission: EM | Admit: 2021-09-11 | Discharge: 2021-09-11 | Disposition: A | Discharge status: Home / Self Care | Payer: BC Managed Care – PPO | Attending: Emergency Medicine | Admitting: Emergency Medicine

## 2021-09-11 ENCOUNTER — Encounter (HOSPITAL_BASED_OUTPATIENT_CLINIC_OR_DEPARTMENT_OTHER): Payer: Self-pay

## 2021-09-11 ENCOUNTER — Other Ambulatory Visit: Payer: Self-pay

## 2021-09-11 DIAGNOSIS — I82621 Acute embolism and thrombosis of deep veins of right upper extremity: Secondary | ICD-10-CM | POA: Diagnosis not present

## 2021-09-11 DIAGNOSIS — I82622 Acute embolism and thrombosis of deep veins of left upper extremity: Secondary | ICD-10-CM | POA: Diagnosis not present

## 2021-09-11 DIAGNOSIS — M79602 Pain in left arm: Secondary | ICD-10-CM | POA: Diagnosis not present

## 2021-09-11 DIAGNOSIS — Z7901 Long term (current) use of anticoagulants: Secondary | ICD-10-CM | POA: Insufficient documentation

## 2021-09-11 DIAGNOSIS — I82612 Acute embolism and thrombosis of superficial veins of left upper extremity: Secondary | ICD-10-CM | POA: Diagnosis not present

## 2021-09-11 DIAGNOSIS — I82A12 Acute embolism and thrombosis of left axillary vein: Secondary | ICD-10-CM | POA: Diagnosis not present

## 2021-09-11 MED ORDER — RIVAROXABAN (XARELTO) VTE STARTER PACK (15 & 20 MG): ORAL_TABLET | ORAL | 0 refills | Status: DC

## 2021-09-11 MED ORDER — RIVAROXABAN 15 MG PO TABS
15.0000 mg | ORAL_TABLET | Freq: Once | ORAL | Status: AC
  Administered 2021-09-11: 15 mg via ORAL
  Filled 2021-09-11: qty 1

## 2021-09-11 MED ORDER — OXYCODONE-ACETAMINOPHEN 5-325 MG PO TABS: 1.0000 | ORAL_TABLET | Freq: Four times a day (QID) | ORAL | 0 refills | Status: DC | PRN

## 2021-09-11 NOTE — Discharge Instructions (Signed)
Begin taking Xarelto as prescribed.  Take Percocet as prescribed as needed for pain.  Rest your arm with no strenuous activity until cleared by your primary doctor.  Follow-up with your primary doctor in the next week to determine the length of anticoagulation you will require.

## 2021-09-11 NOTE — ED Notes (Signed)
Pt verbalizes understanding of discharge instructions. Opportunity for questioning and answers were provided. Pt discharged from ED to home. Xarelto education packet given.

## 2021-09-11 NOTE — ED Triage Notes (Signed)
Patient here POV from Home with Left Arm Pain.  Patient had Hysterectomy a week PTA and had PIV inserted at that time in the Posterior Left Hand.  Patient noted Left Arm Swelling and Pain since yesterday.   Pulses Palpable to Same. Good Capillary Refill.  Uncomfortable during Triage. A&Ox4. GCS 15. Ambulatory.

## 2021-09-11 NOTE — ED Provider Notes (Signed)
[facility] EMERGENCY DEPT Provider Note   CSN: [id number] Arrival date & time: 09/11/21  2001     History  Chief Complaint  Patient presents with   Arm Pain    Alexis Lindsey is a 41 y.o. female.  Patient is a 41 year old female with past medical history of blood dyscrasia diagnosed during pregnancy.  Patient is 1 week status post hysterectomy.  She presents today with complaints of left arm pain and swelling.  The pain initially began at the site of her IV catheter that was placed, then extended up her arm.  She now has swelling to the upper arm, forearm, and extending into the hand.  She has pain with range of motion.  She denies any fevers or chills.  She denies any chest pain or difficulty breathing.  Pain is worse when she moves or palpates.  She has tried taking oxycodone left over from her surgery at home with little relief.  The history is provided by the patient.      Home Medications Prior to Admission medications   Medication Sig Start Date End Date Taking? Authorizing Provider  RIVAROXABAN Carlena Hurl) VTE STARTER PACK (15 & 20 MG) Follow package directions: Take one 15mg  tablet by mouth twice a day. On day 22, switch to one 20mg  tablet once a day. Take with food. 09/11/21  Yes , , MD  buPROPion (WELLBUTRIN XL) 300 MG 24 hr tablet Take 300 mg by mouth daily.    [provider]  clonazePAM (KLONOPIN) 0.5 MG tablet Take 1 mg by mouth every evening.     [provider]  FLUoxetine (PROZAC) 20 MG tablet Take 40 mg by mouth daily.    [provider]  HYDROcodone-acetaminophen (NORCO/VICODIN) 5-325 MG tablet Take 1 tablet by mouth every 6 (six) hours as needed for moderate pain or severe pain. 01/28/20   Riley Lam, MD  levocetirizine (XYZAL) 5 MG tablet Take 5 mg by mouth every evening.    [provider]  traZODone (DESYREL) 50 MG tablet Take 50 mg by mouth every other day.    [provider]  VYVANSE 70  MG capsule Take 70 mg by mouth daily. 07/30/16   [provider]  zolpidem (AMBIEN) 10 MG tablet Take 10 mg by mouth every other day.     [provider]      Allergies    Macrobid [nitrofurantoin macrocrystal] and Codeine    Review of Systems   Review of Systems  All other systems reviewed and are negative.  Physical Exam Updated Vital Signs BP 111/83 (BP Location: Left Arm)    Pulse 85    Temp 98.2 F (36.8 C)    Resp 17    Ht 5\' 9"  (1.753 m)    Wt 77.6 kg    LMP 01/26/2020 (Exact Date)    SpO2 100%    BMI 25.26 kg/m  Physical Exam Vitals and nursing note reviewed.  Constitutional:      Appearance: Normal appearance.  HENT:     Head: Normocephalic and atraumatic.  Pulmonary:     Effort: Pulmonary effort is normal.  Musculoskeletal:     Comments: The left arm has erythema and swelling to the upper arm and forearm.  Ulnar and radial pulses are palpable.  Motor and sensation are intact all fingers and capillary refill is brisk.  Skin:    General: Skin is warm and dry.  Neurological:     Mental Status: She is alert.  ED Results / Procedures / Treatments   Labs (all labs ordered are listed, but only abnormal results are displayed) Labs Reviewed - No data to display  EKG None  Radiology US Venous Img Upper Uni Left  Result Date: 09/11/2021 CLINICAL DATA:  Left arm pain/swelling EXAM: LEFT UPPER EXTREMITY VENOUS DOPPLER ULTRASOUND TECHNIQUE: Gray-scale sonography with graded compression, as well as color Doppler and duplex ultrasound were performed to evaluate the upper extremity deep venous system from the level of the subclavian vein and including the jugular, axillary, basilic, radial, ulnar and upper cephalic vein. Spectral Doppler was utilized to evaluate flow at rest and with distal augmentation maneuvers. COMPARISON:  None. FINDINGS: Contralateral Subclavian Vein: Respiratory phasicity is normal and symmetric with the symptomatic side. No evidence of  thrombus. Normal compressibility. Internal Jugular Vein: No evidence of thrombus. Normal compressibility, respiratory phasicity and response to augmentation. Subclavian Vein: No evidence of thrombus. Normal compressibility, respiratory phasicity and response to augmentation. Axillary Vein: Thrombus in the distal axillary vein. Cephalic Vein: Thrombus in the left cephalic vein extending from the mid upper arm to the wrist. Basilic Vein: No evidence of thrombus. Normal compressibility, respiratory phasicity and response to augmentation. Brachial Veins: Thrombus extending from the axillary vein into 1 of 2 mid brachial veins. Radial Veins: No evidence of thrombus. Normal compressibility, respiratory phasicity and response to augmentation. Ulnar Veins: No evidence of thrombus. Normal compressibility, respiratory phasicity and response to augmentation. Venous Reflux:  None visualized. Other Findings:  Subcutaneous edema/swelling in the wrist. IMPRESSION: Deep venous thrombosis in the distal axillary vein extending into one of the brachial veins. Superficial venous thrombosis in the cephalic vein. Electronically Signed   By: Charline Bills M.D.   On: 09/11/2021 22:04    Procedures Procedures    Medications Ordered in ED Medications  Rivaroxaban (XARELTO) tablet 15 mg (has no administration in time range)    ED Course/ Medical Decision Making/ A&P  Ultrasound shows positive for DVT.  Patient to be treated with Xarelto, pain medication, and arm sling, and is to follow-up with primary doctor to determine length of anticoagulation.  Final Clinical Impression(s) / ED Diagnoses Final diagnoses:  None    Rx / DC Orders ED Discharge Orders          Ordered    RIVAROXABAN (XARELTO) VTE STARTER PACK (15 & 20 MG)        09/11/21 2327              Geoffery Lyons, MD 09/11/21 2332

## 2021-09-12 DIAGNOSIS — F410 Panic disorder [episodic paroxysmal anxiety] without agoraphobia: Secondary | ICD-10-CM | POA: Diagnosis not present

## 2021-09-12 DIAGNOSIS — F3342 Major depressive disorder, recurrent, in full remission: Secondary | ICD-10-CM | POA: Diagnosis not present

## 2021-09-12 DIAGNOSIS — F900 Attention-deficit hyperactivity disorder, predominantly inattentive type: Secondary | ICD-10-CM | POA: Diagnosis not present

## 2021-09-12 DIAGNOSIS — F5101 Primary insomnia: Secondary | ICD-10-CM | POA: Diagnosis not present

## 2021-09-20 ENCOUNTER — Telehealth: Payer: Self-pay | Admitting: Physician Assistant

## 2021-09-20 NOTE — Telephone Encounter (Signed)
Scheduled appt per 2/23 referral. Pt is aware of appt date and time. Pt is aware to arrive 15 mins prior to appt time and to bring and updated insurance card. Pt is aware of appt location.   °
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
## 2021-10-02 ENCOUNTER — Encounter: Payer: Self-pay | Admitting: Physician Assistant

## 2021-10-02 ENCOUNTER — Inpatient Hospital Stay: Payer: BC Managed Care – PPO | Attending: Physician Assistant | Admitting: Physician Assistant

## 2021-10-02 ENCOUNTER — Inpatient Hospital Stay: Payer: BC Managed Care – PPO

## 2021-10-02 ENCOUNTER — Other Ambulatory Visit: Payer: Self-pay

## 2021-10-02 VITALS — BP 107/71 | HR 89 | Temp 97.2°F | Resp 16 | Wt 174.3 lb

## 2021-10-02 DIAGNOSIS — I82409 Acute embolism and thrombosis of unspecified deep veins of unspecified lower extremity: Secondary | ICD-10-CM | POA: Insufficient documentation

## 2021-10-02 DIAGNOSIS — I82622 Acute embolism and thrombosis of deep veins of left upper extremity: Secondary | ICD-10-CM

## 2021-10-02 DIAGNOSIS — Z7901 Long term (current) use of anticoagulants: Secondary | ICD-10-CM | POA: Insufficient documentation

## 2021-10-02 DIAGNOSIS — Z79899 Other long term (current) drug therapy: Secondary | ICD-10-CM | POA: Insufficient documentation

## 2021-10-02 DIAGNOSIS — Z87891 Personal history of nicotine dependence: Secondary | ICD-10-CM | POA: Insufficient documentation

## 2021-10-02 LAB — CMP (CANCER CENTER ONLY)
ALT: 20 U/L (ref 0–44)
AST: 16 U/L (ref 15–41)
Albumin: 4.1 g/dL (ref 3.5–5.0)
Alkaline Phosphatase: 64 U/L (ref 38–126)
Anion gap: 6 (ref 5–15)
BUN: 14 mg/dL (ref 6–20)
CO2: 27 mmol/L (ref 22–32)
Calcium: 8.7 mg/dL — ABNORMAL LOW (ref 8.9–10.3)
Chloride: 106 mmol/L (ref 98–111)
Creatinine: 1 mg/dL (ref 0.44–1.00)
GFR, Estimated: >60 mL/min (ref >60)
Glucose, Bld: 111 mg/dL — ABNORMAL HIGH (ref 70–99)
Potassium: 3.9 mmol/L (ref 3.5–5.1)
Sodium: 139 mmol/L (ref 135–145)
Total Bilirubin: 0.2 mg/dL — ABNORMAL LOW (ref 0.3–1.2)
Total Protein: 7.1 g/dL (ref 6.5–8.1)

## 2021-10-02 LAB — CBC WITH DIFFERENTIAL (CANCER CENTER ONLY)
Abs Immature Granulocytes: 0.01 K/uL (ref 0.00–0.07)
Basophils Absolute: 0.1 K/uL (ref 0.0–0.1)
Basophils Relative: 1 %
Eosinophils Absolute: 0.4 K/uL (ref 0.0–0.5)
Eosinophils Relative: 5 %
HCT: 38.6 % (ref 36.0–46.0)
Hemoglobin: 12.7 g/dL (ref 12.0–15.0)
Immature Granulocytes: 0 %
Lymphocytes Relative: 31 %
Lymphs Abs: 2.1 K/uL (ref 0.7–4.0)
MCH: 28.7 pg (ref 26.0–34.0)
MCHC: 32.9 g/dL (ref 30.0–36.0)
MCV: 87.1 fL (ref 80.0–100.0)
Monocytes Absolute: 0.3 K/uL (ref 0.1–1.0)
Monocytes Relative: 5 %
Neutro Abs: 4.1 K/uL (ref 1.7–7.7)
Neutrophils Relative %: 58 %
Platelet Count: 275 K/uL (ref 150–400)
RBC: 4.43 MIL/uL (ref 3.87–5.11)
RDW: 12.4 % (ref 11.5–15.5)
WBC Count: 7 K/uL (ref 4.0–10.5)
nRBC: 0 % (ref 0.0–0.2)

## 2021-10-02 MED ORDER — RIVAROXABAN 20 MG PO TABS: 20.0000 mg | ORAL_TABLET | Freq: Every day | ORAL | 3 refills | Status: DC

## 2021-10-04 ENCOUNTER — Telehealth: Payer: Self-pay

## 2021-10-04 NOTE — Telephone Encounter (Signed)
-----   Message from Briant Cedar, PA-C sent at 10/02/2021  4:52 PM EST ----- ?Please notify patient that labs were reviewed and require no intervention. We will see her back in May 2023 for a follow up.  ?

## 2021-10-04 NOTE — Telephone Encounter (Signed)
LM X2 asking pt to return my call for lab results. ?

## 2021-10-07 ENCOUNTER — Telehealth: Payer: Self-pay | Admitting: Physician Assistant

## 2021-10-07 NOTE — Telephone Encounter (Signed)
Scheduled per 3/9 los, message has been left ?

## 2021-11-06 DIAGNOSIS — R824 Acetonuria: Secondary | ICD-10-CM | POA: Diagnosis not present

## 2021-11-06 DIAGNOSIS — R000 Tachycardia, unspecified: Secondary | ICD-10-CM | POA: Diagnosis not present

## 2021-11-06 DIAGNOSIS — Z86718 Personal history of other venous thrombosis and embolism: Secondary | ICD-10-CM | POA: Diagnosis not present

## 2021-11-06 DIAGNOSIS — R112 Nausea with vomiting, unspecified: Secondary | ICD-10-CM | POA: Diagnosis not present

## 2021-11-06 DIAGNOSIS — E860 Dehydration: Secondary | ICD-10-CM | POA: Diagnosis not present

## 2021-11-06 DIAGNOSIS — Z7901 Long term (current) use of anticoagulants: Secondary | ICD-10-CM | POA: Diagnosis not present

## 2021-11-06 DIAGNOSIS — R109 Unspecified abdominal pain: Secondary | ICD-10-CM | POA: Diagnosis not present

## 2021-11-06 DIAGNOSIS — K529 Noninfective gastroenteritis and colitis, unspecified: Secondary | ICD-10-CM | POA: Diagnosis not present

## 2021-12-10 ENCOUNTER — Other Ambulatory Visit: Payer: Self-pay | Admitting: Physician Assistant

## 2021-12-10 DIAGNOSIS — I82622 Acute embolism and thrombosis of deep veins of left upper extremity: Secondary | ICD-10-CM

## 2021-12-10 DIAGNOSIS — Z86718 Personal history of other venous thrombosis and embolism: Secondary | ICD-10-CM

## 2021-12-11 ENCOUNTER — Inpatient Hospital Stay: Payer: BC Managed Care – PPO | Admitting: Physician Assistant

## 2021-12-11 ENCOUNTER — Inpatient Hospital Stay: Payer: BC Managed Care – PPO | Attending: Physician Assistant

## 2022-04-21 DIAGNOSIS — F331 Major depressive disorder, recurrent, moderate: Secondary | ICD-10-CM | POA: Diagnosis not present

## 2022-04-21 DIAGNOSIS — F5101 Primary insomnia: Secondary | ICD-10-CM | POA: Diagnosis not present

## 2022-04-21 DIAGNOSIS — F410 Panic disorder [episodic paroxysmal anxiety] without agoraphobia: Secondary | ICD-10-CM | POA: Diagnosis not present

## 2022-04-21 DIAGNOSIS — F900 Attention-deficit hyperactivity disorder, predominantly inattentive type: Secondary | ICD-10-CM | POA: Diagnosis not present

## 2022-11-27 DIAGNOSIS — F410 Panic disorder [episodic paroxysmal anxiety] without agoraphobia: Secondary | ICD-10-CM | POA: Diagnosis not present

## 2022-11-27 DIAGNOSIS — F5101 Primary insomnia: Secondary | ICD-10-CM | POA: Diagnosis not present

## 2022-11-27 DIAGNOSIS — F900 Attention-deficit hyperactivity disorder, predominantly inattentive type: Secondary | ICD-10-CM | POA: Diagnosis not present

## 2022-11-27 DIAGNOSIS — F3342 Major depressive disorder, recurrent, in full remission: Secondary | ICD-10-CM | POA: Diagnosis not present

## 2022-12-15 ENCOUNTER — Encounter (HOSPITAL_BASED_OUTPATIENT_CLINIC_OR_DEPARTMENT_OTHER): Payer: Self-pay

## 2022-12-15 ENCOUNTER — Other Ambulatory Visit: Payer: Self-pay

## 2022-12-15 ENCOUNTER — Emergency Department (HOSPITAL_BASED_OUTPATIENT_CLINIC_OR_DEPARTMENT_OTHER)
Admission: EM | Admit: 2022-12-15 | Discharge: 2022-12-15 | Disposition: A | Discharge status: Home / Self Care | Payer: BC Managed Care – PPO | Attending: Emergency Medicine | Admitting: Emergency Medicine

## 2022-12-15 ENCOUNTER — Emergency Department (HOSPITAL_BASED_OUTPATIENT_CLINIC_OR_DEPARTMENT_OTHER): Payer: BC Managed Care – PPO

## 2022-12-15 DIAGNOSIS — I82432 Acute embolism and thrombosis of left popliteal vein: Secondary | ICD-10-CM | POA: Diagnosis not present

## 2022-12-15 DIAGNOSIS — Z7901 Long term (current) use of anticoagulants: Secondary | ICD-10-CM | POA: Insufficient documentation

## 2022-12-15 DIAGNOSIS — I82431 Acute embolism and thrombosis of right popliteal vein: Secondary | ICD-10-CM | POA: Diagnosis not present

## 2022-12-15 DIAGNOSIS — M79605 Pain in left leg: Secondary | ICD-10-CM | POA: Diagnosis not present

## 2022-12-15 LAB — CBC WITH DIFFERENTIAL/PLATELET
Abs Immature Granulocytes: 0.02 10*3/uL (ref 0.00–0.07)
Basophils Absolute: 0.1 10*3/uL (ref 0.0–0.1)
Basophils Relative: 1 %
Eosinophils Absolute: 0.5 10*3/uL (ref 0.0–0.5)
Eosinophils Relative: 5 %
HCT: 41.6 % (ref 36.0–46.0)
Hemoglobin: 14 g/dL (ref 12.0–15.0)
Immature Granulocytes: 0 %
Lymphocytes Relative: 21 %
Lymphs Abs: 2.2 10*3/uL (ref 0.7–4.0)
MCH: 29.9 pg (ref 26.0–34.0)
MCHC: 33.7 g/dL (ref 30.0–36.0)
MCV: 88.7 fL (ref 80.0–100.0)
Monocytes Absolute: 0.6 10*3/uL (ref 0.1–1.0)
Monocytes Relative: 6 %
Neutro Abs: 6.9 10*3/uL (ref 1.7–7.7)
Neutrophils Relative %: 67 %
Platelets: 246 10*3/uL (ref 150–400)
RBC: 4.69 MIL/uL (ref 3.87–5.11)
RDW: 12.5 % (ref 11.5–15.5)
WBC: 10.2 10*3/uL (ref 4.0–10.5)
nRBC: 0 % (ref 0.0–0.2)

## 2022-12-15 LAB — COMPREHENSIVE METABOLIC PANEL
ALT: 22 U/L (ref 0–44)
AST: 18 U/L (ref 15–41)
Albumin: 4.2 g/dL (ref 3.5–5.0)
Alkaline Phosphatase: 49 U/L (ref 38–126)
Anion gap: 7 (ref 5–15)
BUN: 18 mg/dL (ref 6–20)
CO2: 27 mmol/L (ref 22–32)
Calcium: 9.6 mg/dL (ref 8.9–10.3)
Chloride: 105 mmol/L (ref 98–111)
Creatinine, Ser: 1.12 mg/dL — ABNORMAL HIGH (ref 0.44–1.00)
GFR, Estimated: >60 mL/min (ref >60)
Glucose, Bld: 81 mg/dL (ref 70–99)
Potassium: 4.4 mmol/L (ref 3.5–5.1)
Sodium: 139 mmol/L (ref 135–145)
Total Bilirubin: 0.4 mg/dL (ref 0.3–1.2)
Total Protein: 6.6 g/dL (ref 6.5–8.1)

## 2022-12-15 MED ORDER — RIVAROXABAN 15 MG PO TABS
15.0000 mg | ORAL_TABLET | Freq: Two times a day (BID) | ORAL | Status: DC
  Administered 2022-12-15: 15 mg via ORAL
  Filled 2022-12-15: qty 1

## 2022-12-15 MED ORDER — RIVAROXABAN (XARELTO) EDUCATION KIT FOR DVT/PE PATIENTS: PACK | Freq: Once | Status: AC

## 2022-12-15 MED ORDER — ONDANSETRON 4 MG PO TBDP: 4.0000 mg | ORAL_TABLET | Freq: Three times a day (TID) | ORAL | 0 refills | Status: DC | PRN

## 2022-12-15 MED ORDER — OXYCODONE-ACETAMINOPHEN 5-325 MG PO TABS: 1.0000 | ORAL_TABLET | Freq: Four times a day (QID) | ORAL | 0 refills | Status: DC | PRN

## 2022-12-15 MED ORDER — RIVAROXABAN 20 MG PO TABS: 20.0000 mg | ORAL_TABLET | Freq: Every day | ORAL | Status: DC

## 2022-12-15 MED ORDER — OXYCODONE-ACETAMINOPHEN 5-325 MG PO TABS
1.0000 | ORAL_TABLET | Freq: Once | ORAL | Status: AC
  Administered 2022-12-15: 1 via ORAL
  Filled 2022-12-15: qty 1

## 2022-12-15 MED ORDER — ONDANSETRON 4 MG PO TBDP
4.0000 mg | ORAL_TABLET | Freq: Once | ORAL | Status: AC
  Administered 2022-12-15: 4 mg via ORAL
  Filled 2022-12-15: qty 1

## 2022-12-15 MED ORDER — RIVAROXABAN (XARELTO) VTE STARTER PACK (15 & 20 MG): 1.0000 | ORAL_TABLET | ORAL | 0 refills | Status: DC

## 2022-12-15 NOTE — ED Notes (Signed)
RN reviewed discharge instructions with pt. Pt verbalized understanding and had no further questions. VSS upon discharge.  

## 2022-12-15 NOTE — ED Provider Notes (Signed)
[place] EMERGENCY DEPARTMENT AT [facility] Provider Note   CSN: [id number] Arrival date & time: 12/15/22  1740     History  Chief Complaint  Patient presents with   Leg Pain    Alexis Lindsey is a 42 y.o. female.  With history of antiphospholipid syndrome and history of DVT who presents with left leg pain and swelling since yesterday.   Leg Pain      Home Medications Prior to Admission medications   Medication Sig Start Date End Date Taking? Authorizing Provider  buPROPion (WELLBUTRIN XL) 300 MG 24 hr tablet Take 300 mg by mouth daily.    [provider]  clonazePAM (KLONOPIN) 0.5 MG tablet Take 1 mg by mouth every evening.    [provider]  levocetirizine (XYZAL) 5 MG tablet Take 5 mg by mouth every evening.    [provider]  rivaroxaban (XARELTO) 20 MG TABS tablet Take 1 tablet (20 mg total) by mouth daily with supper. 10/02/21   Briant Cedar, PA-C  VYVANSE 70 MG capsule Take 70 mg by mouth daily. 07/30/16   [provider]  zolpidem (AMBIEN) 10 MG tablet Take 10 mg by mouth every other day.    [provider]      Allergies    Macrobid [nitrofurantoin macrocrystal] and Codeine    Review of Systems   Review of Systems  Physical Exam Updated Vital Signs BP 116/79   Pulse 90   Temp 98 F (36.7 C)   Resp 18   Ht 5\' 9"  (1.753 m)   Wt 77.6 kg   LMP 01/26/2020 (Exact Date)   SpO2 99%   BMI 25.25 kg/m  Physical Exam  ED Results / Procedures / Treatments   Labs (all labs ordered are listed, but only abnormal results are displayed) Labs Reviewed  CBC WITH DIFFERENTIAL/PLATELET  COMPREHENSIVE METABOLIC PANEL    EKG None  Radiology US Venous Img Lower Unilateral Left  Result Date: 12/15/2022 CLINICAL DATA:  Left leg pain EXAM: LEFT LOWER EXTREMITY VENOUS DOPPLER ULTRASOUND TECHNIQUE: Gray-scale sonography with compression, as well as color and duplex ultrasound, were performed to evaluate the  deep venous system(s) from the level of the common femoral vein through the popliteal and proximal calf veins. COMPARISON:  None Available. FINDINGS: VENOUS The left common femoral vein is patent with appropriate antegrade flow, respiratory variation, and augmentation. Greater saphenous vein is patent at the saphenofemoral junction. The profundus femoral vein is patent proximally with appropriate antegrade flow. The lesser saphenous vein at the saphenopopliteal junction as well as the central popliteal vein and peripheral femoral vein all demonstrate occlusive thrombus. Limited views of the contralateral common femoral vein are unremarkable. OTHER None. Limitations: none IMPRESSION: 1. Occlusive thrombus within the central popliteal vein, peripheral femoral vein, and lesser saphenous vein. 2. These results will be called to the ordering clinician or representative by the Radiologist Assistant, and communication documented in the PACS or [employer]. Electronically Signed   By: Helyn Numbers M.D.   On: 12/15/2022 19:47    Procedures Procedures  {Document cardiac monitor, telemetry assessment procedure when appropriate:1}  Medications Ordered in ED Medications - No data to display  ED Course/ Medical Decision Making/ A&P   {   Click here for ABCD2, HEART and other calculatorsREFRESH Note before signing :1}                          Medical Decision Making  ***  {  Document critical care time when appropriate:1} {Document review of labs and clinical decision tools ie heart score, Chads2Vasc2 etc:1}  {Document your independent review of radiology images, and any outside records:1} {Document your discussion with family members, caretakers, and with consultants:1} {Document social determinants of health affecting pt's care:1} {Document your decision making why or why not admission, treatments were needed:1} Final Clinical Impression(s) / ED Diagnoses Final diagnoses:  None    Rx / DC  Orders ED Discharge Orders     None

## 2022-12-15 NOTE — Discharge Instructions (Addendum)
You have a DVT of your left leg.  Make an appointment with your hematologist as discussed.  Start taking Xarelto as prescribed.  You can take the Percocet as needed for severe pain and also alternate heat and ice.  Make sure to keep active.  You can take Zofran as needed for nausea or vomiting.  Make sure to watch for any signs of bleeding or come back for any severe worsening pain, swelling, chest pain, shortness of breath or fainting.  I have referred you to vascular surgery.  If they do not call you in the next 2 to 3 days, you can also call Dr. Chestine Spore listed in your discharge paperwork for follow-up regarding your new DVT of left leg.

## 2022-12-15 NOTE — ED Triage Notes (Signed)
Patient here POV from Home.  Endorses Left Leg Pain that began last PM. Pain is to Posterior Lower Left Leg. No Trauma. No Fever.   No Anticoagulants. History of Emboli in Arm.   NAD Noted during Triage. A&Ox4. GCS 15. BIB Wheelchair.

## 2022-12-17 ENCOUNTER — Telehealth: Payer: Self-pay | Admitting: Physician Assistant

## 2022-12-24 ENCOUNTER — Ambulatory Visit (HOSPITAL_COMMUNITY)
Admission: RE | Admit: 2022-12-24 | Discharge: 2022-12-24 | Disposition: A | Discharge status: Home / Self Care | Payer: BC Managed Care – PPO | Source: Ambulatory Visit | Attending: Surgery | Admitting: Surgery

## 2022-12-24 ENCOUNTER — Encounter (HOSPITAL_COMMUNITY): Payer: Self-pay

## 2022-12-24 VITALS — BP 123/78 | HR 101

## 2022-12-24 DIAGNOSIS — I82432 Acute embolism and thrombosis of left popliteal vein: Secondary | ICD-10-CM | POA: Diagnosis not present

## 2022-12-24 MED ORDER — RIVAROXABAN 20 MG PO TABS: 20.0000 mg | ORAL_TABLET | Freq: Every day | ORAL | 5 refills | Status: DC
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
## 2022-12-24 NOTE — Patient Instructions (Signed)
-  Continue rivaroxaban (Xarelto) 15 mg twice daily with food for 21 days followed by 20 mg daily with food. -Your refills have been sent to your [facility]. You may need to call the pharmacy to ask them to fill this when you start to run low on your current supply.  -Follow up with hematology.  -It is important to take your medication around the same time every day.  -Avoid NSAIDs like ibuprofen (Advil, Motrin) and naproxen (Aleve) as well as aspirin doses over 100 mg daily. -Tylenol (acetaminophen) is the preferred over the counter pain medication to lower the risk of bleeding. -Be sure to alert all of your health care providers that you are taking an anticoagulant prior to starting a new medication or having a procedure. -Monitor for signs and symptoms of bleeding (abnormal bruising, prolonged bleeding, nose bleeds, bleeding from gums, discolored urine, black tarry stools). If you have fallen and hit your head OR if your bleeding is severe or not stopping, seek emergency care.  -Go to the emergency room if emergent signs and symptoms of new clot occur (new or worse swelling and pain in an arm or leg, shortness of breath, chest pain, fast or irregular heartbeats, lightheadedness, dizziness, fainting, coughing up blood) or if you experience a significant color change (pale or blue) in the extremity that has the DVT.  -We recommend you wear compression stockings as long as you are having swelling or pain. Be sure to purchase the correct size and take them off at night.   Follow up with the DVT Clinic as needed.  [facility] [address] [place], [place], [place] [address] Enter the hospital through Entrance C off [address] and pull up to the [facility]ascular [facility] entrance to the free valet parking.  Check in for your appointment at the Heart [facility] Vascular Center.   If you have any questions or need to reschedule an appointment, please call [phone] [facility].  If you are  having an emergency, call 911 or present to the nearest emergency room.   What is a DVT?  -Deep vein thrombosis (DVT) is a condition in which a blood clot forms in a vein of the deep venous system which can occur in the lower leg, thigh, pelvis, arm, or neck. This condition is serious and can be life-threatening if the clot travels to the arteries of the lungs and causing a blockage (pulmonary embolism, PE). A DVT can also damage veins in the leg, which can lead to long-term venous disease, leg pain, swelling, discoloration, and ulcers or sores (post-thrombotic syndrome).  -Treatment may include taking an anticoagulant medication to prevent more clots from forming and the current clot from growing, wearing compression stockings, and/or surgical procedures to remove or dissolve the clot.

## 2022-12-29 ENCOUNTER — Other Ambulatory Visit: Payer: Self-pay | Admitting: Physician Assistant

## 2022-12-29 ENCOUNTER — Other Ambulatory Visit: Payer: Self-pay

## 2022-12-29 DIAGNOSIS — I82622 Acute embolism and thrombosis of deep veins of left upper extremity: Secondary | ICD-10-CM

## 2022-12-30 ENCOUNTER — Telehealth: Payer: Self-pay | Admitting: Physician Assistant

## 2022-12-30 ENCOUNTER — Inpatient Hospital Stay: Payer: BC Managed Care – PPO | Attending: Physician Assistant

## 2022-12-30 ENCOUNTER — Telehealth: Payer: Self-pay

## 2022-12-30 ENCOUNTER — Inpatient Hospital Stay (HOSPITAL_BASED_OUTPATIENT_CLINIC_OR_DEPARTMENT_OTHER): Payer: BC Managed Care – PPO | Admitting: Physician Assistant

## 2022-12-30 VITALS — BP 114/85 | HR 102 | Temp 97.7°F | Resp 17 | Wt 169.1 lb

## 2022-12-30 DIAGNOSIS — Z90710 Acquired absence of both cervix and uterus: Secondary | ICD-10-CM | POA: Diagnosis not present

## 2022-12-30 DIAGNOSIS — Z7901 Long term (current) use of anticoagulants: Secondary | ICD-10-CM | POA: Insufficient documentation

## 2022-12-30 DIAGNOSIS — Z809 Family history of malignant neoplasm, unspecified: Secondary | ICD-10-CM | POA: Insufficient documentation

## 2022-12-30 DIAGNOSIS — Z86718 Personal history of other venous thrombosis and embolism: Secondary | ICD-10-CM

## 2022-12-30 DIAGNOSIS — Z87891 Personal history of nicotine dependence: Secondary | ICD-10-CM | POA: Diagnosis not present

## 2022-12-30 DIAGNOSIS — I82622 Acute embolism and thrombosis of deep veins of left upper extremity: Secondary | ICD-10-CM | POA: Insufficient documentation

## 2022-12-30 LAB — CBC WITH DIFFERENTIAL (CANCER CENTER ONLY)
Abs Immature Granulocytes: 0.02 10*3/uL (ref 0.00–0.07)
Basophils Absolute: 0.1 10*3/uL (ref 0.0–0.1)
Basophils Relative: 1 %
Eosinophils Absolute: 0.4 10*3/uL (ref 0.0–0.5)
Eosinophils Relative: 6 %
HCT: 43.6 % (ref 36.0–46.0)
Hemoglobin: 14.4 g/dL (ref 12.0–15.0)
Immature Granulocytes: 0 %
Lymphocytes Relative: 26 %
Lymphs Abs: 1.7 10*3/uL (ref 0.7–4.0)
MCH: 29.2 pg (ref 26.0–34.0)
MCHC: 33 g/dL (ref 30.0–36.0)
MCV: 88.4 fL (ref 80.0–100.0)
Monocytes Absolute: 0.3 10*3/uL (ref 0.1–1.0)
Monocytes Relative: 5 %
Neutro Abs: 4.1 10*3/uL (ref 1.7–7.7)
Neutrophils Relative %: 62 %
Platelet Count: 286 10*3/uL (ref 150–400)
RBC: 4.93 MIL/uL (ref 3.87–5.11)
RDW: 11.9 % (ref 11.5–15.5)
WBC Count: 6.6 10*3/uL (ref 4.0–10.5)
nRBC: 0 % (ref 0.0–0.2)

## 2022-12-30 LAB — CMP (CANCER CENTER ONLY)
ALT: 31 U/L (ref 0–44)
AST: 19 U/L (ref 15–41)
Albumin: 4.3 g/dL (ref 3.5–5.0)
Alkaline Phosphatase: 58 U/L (ref 38–126)
Anion gap: 5 (ref 5–15)
BUN: 14 mg/dL (ref 6–20)
CO2: 29 mmol/L (ref 22–32)
Calcium: 9.4 mg/dL (ref 8.9–10.3)
Chloride: 104 mmol/L (ref 98–111)
Creatinine: 1.24 mg/dL — ABNORMAL HIGH (ref 0.44–1.00)
GFR, Estimated: 55 mL/min — ABNORMAL LOW (ref >60)
Glucose, Bld: 111 mg/dL — ABNORMAL HIGH (ref 70–99)
Potassium: 4.3 mmol/L (ref 3.5–5.1)
Sodium: 138 mmol/L (ref 135–145)
Total Bilirubin: 0.6 mg/dL (ref 0.3–1.2)
Total Protein: 7.3 g/dL (ref 6.5–8.1)

## 2022-12-30 NOTE — Telephone Encounter (Signed)
can you help me schedule a CT venogram for this lady in 1-2 weeks also, can you let her know when you tell her about the CT scan appt to increase her fluids. her creatinine is slightly up   IT  CT venogram scheduled for 6/19 at 4:30. Pt advised of Ct appt date and time and to increase her fluid intake.

## 2022-12-30 NOTE — Telephone Encounter (Signed)
Patient aware of dates/times.
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
## 2023-01-05 NOTE — Telephone Encounter (Signed)
Received vm message from pt regarding her upcoming scan. TCT patient and spoke with her. She is asking if she will need to have any sedation for this scan or drink any oral contrast. Advised that she would not need either of these. But advised that likely she will have an IV for IV contrast so that her blood vessels are visible. She also asked when she can expect her results  and that if she needed surgery, would that happen right away. Advised that she can expect results in 4-7 days. Any surgical intervention would be preceded by appts with vascular surgeon and his recommendations. Pt voiced understanding.

## 2023-01-07 ENCOUNTER — Other Ambulatory Visit: Payer: Self-pay | Admitting: Physician Assistant

## 2023-01-07 ENCOUNTER — Telehealth (HOSPITAL_COMMUNITY): Payer: Self-pay | Admitting: Student-PharmD

## 2023-01-07 DIAGNOSIS — I82432 Acute embolism and thrombosis of left popliteal vein: Secondary | ICD-10-CM

## 2023-01-07 MED ORDER — RIVAROXABAN 20 MG PO TABS
20.0000 mg | ORAL_TABLET | Freq: Every day | ORAL | 5 refills | Status: DC
Start: 2023-01-07 — End: 2023-01-26

## 2023-01-07 NOTE — Telephone Encounter (Signed)
Patient called saying her pharmacy did not receive refills for Xarelto. Our records show 6 months of refills were sent on 12/24/22. Confirmed this was sent to the correct pharmacy. I called the pharmacy who said they did receive the prescription but it was canceled by the pharmacy for some reason and will need another prescription. I have sent in the same prescription as before for them to fill. I called the patient back to make her aware that it was resolved.

## 2023-01-07 NOTE — Telephone Encounter (Signed)
Ordered 5/29 by a different provider

## 2023-01-14 ENCOUNTER — Ambulatory Visit (HOSPITAL_COMMUNITY)
Admission: RE | Admit: 2023-01-14 | Discharge: 2023-01-14 | Disposition: A | Discharge status: Home / Self Care | Payer: BC Managed Care – PPO | Source: Ambulatory Visit | Attending: Physician Assistant | Admitting: Physician Assistant

## 2023-01-14 DIAGNOSIS — Z86718 Personal history of other venous thrombosis and embolism: Secondary | ICD-10-CM | POA: Insufficient documentation

## 2023-01-14 DIAGNOSIS — Z0389 Encounter for observation for other suspected diseases and conditions ruled out: Secondary | ICD-10-CM | POA: Diagnosis not present

## 2023-01-14 MED ORDER — IOHEXOL 350 MG/ML SOLN
150.0000 mL | Freq: Once | INTRAVENOUS | Status: AC | PRN
  Administered 2023-01-14: 150 mL via INTRAVENOUS

## 2023-01-15 ENCOUNTER — Telehealth: Payer: Self-pay | Admitting: Physician Assistant

## 2023-01-15 ENCOUNTER — Telehealth: Payer: Self-pay | Admitting: *Deleted

## 2023-01-15 DIAGNOSIS — K869 Disease of pancreas, unspecified: Secondary | ICD-10-CM

## 2023-01-15 NOTE — Telephone Encounter (Signed)
Recived vm message from pt requesting a call back. TCT patient and spoke with her. She states she has already spoken with Sallye Lat, PA and she was able to assist pt.. No other questions or concerns.

## 2023-01-15 NOTE — Telephone Encounter (Signed)
I notified Alexis Lindsey by phone regarding CT venogram results. Findings show no indication of May-Thurner physiology and no acute vascular abnormalities in abdomen or pelvis. 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  Patient agrees with plan for outpatient MR abdomen for further evaluation. Patient reported pain around her umbilicus with associated nausea x 2 days ago that spontaneously resolved. Denied fever. Now that symptoms have resolved she does not feel ED evaluation is necessary. I discussed concerning symptoms that would warrant urgent evaluation.  All of patient's questions were answered and she expressed understanding of the plan provided.

## 2023-01-16 ENCOUNTER — Encounter: Payer: Self-pay | Admitting: Physician Assistant

## 2023-01-18 ENCOUNTER — Other Ambulatory Visit: Payer: Self-pay | Admitting: Physician Assistant

## 2023-01-18 ENCOUNTER — Ambulatory Visit (HOSPITAL_COMMUNITY)
Admission: RE | Admit: 2023-01-18 | Discharge: 2023-01-18 | Disposition: A | Discharge status: Home / Self Care | Payer: BC Managed Care – PPO | Source: Ambulatory Visit | Attending: Physician Assistant | Admitting: Physician Assistant

## 2023-01-18 DIAGNOSIS — K862 Cyst of pancreas: Secondary | ICD-10-CM | POA: Diagnosis not present

## 2023-01-18 DIAGNOSIS — K869 Disease of pancreas, unspecified: Secondary | ICD-10-CM

## 2023-01-18 DIAGNOSIS — K8689 Other specified diseases of pancreas: Secondary | ICD-10-CM | POA: Diagnosis not present

## 2023-01-18 MED ORDER — GADOBUTROL 1 MMOL/ML IV SOLN
7.5000 mL | Freq: Once | INTRAVENOUS | Status: AC | PRN
  Administered 2023-01-18: 7.5 mL via INTRAVENOUS

## 2023-01-19 ENCOUNTER — Other Ambulatory Visit: Payer: Self-pay | Admitting: Physician Assistant

## 2023-01-19 ENCOUNTER — Telehealth: Payer: Self-pay | Admitting: Physician Assistant

## 2023-01-19 DIAGNOSIS — K869 Disease of pancreas, unspecified: Secondary | ICD-10-CM

## 2023-01-19 NOTE — Progress Notes (Unsigned)
Referral for surgery

## 2023-01-19 NOTE — Telephone Encounter (Signed)
I notified Alexis Lindsey by phone regarding MR abdomen results. 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 Urgent referral sent to [facility]. All of patient's questions were answered and she expressed understanding of the plan provided.  Plan discussed with oncologist Dr. Leonides Schanz who agrees.

## 2023-01-20 ENCOUNTER — Other Ambulatory Visit: Payer: Self-pay | Admitting: Physician Assistant

## 2023-01-20 ENCOUNTER — Other Ambulatory Visit: Payer: Self-pay

## 2023-01-20 ENCOUNTER — Telehealth: Payer: Self-pay

## 2023-01-20 DIAGNOSIS — K869 Disease of pancreas, unspecified: Secondary | ICD-10-CM

## 2023-01-20 DIAGNOSIS — K8689 Other specified diseases of pancreas: Secondary | ICD-10-CM

## 2023-01-20 MED ORDER — OXYCODONE-ACETAMINOPHEN 5-325 MG PO TABS: 1.0000 | ORAL_TABLET | Freq: Four times a day (QID) | ORAL | 0 refills | Status: AC | PRN

## 2023-01-20 NOTE — Progress Notes (Signed)
Order placed for urgent GI referral for recent incidental finding of pancreatic mass. Patient is scheduled to see surgery clinic on 02/06/23. Surgeon requested GI eval and EUS prior to that visit.  Patient also requested pain medicine refill. She was prescribed Percocet 5-325 mg for pain from recent DVT. She has been taking prescription to help with abdominal pain. She has been taking 1 tablet q6 hours for severe pain only. As patient is in the early stages of malignancy work up I will send 10 day course of Percocet. Patient advised to only take for severe pain. If pain worsens, she develops fever, or symptoms worsen she knows to seek ED evaluation. I have reviewed the PDMP during this encounter.

## 2023-01-20 NOTE — Telephone Encounter (Signed)
EUS scheduled, pt instructed and medications reviewed.  Patient instructions mailed to home.  Patient to call with any questions or concerns.  Referring has been advised and aware to get labs as requested.  The pt will let me know if she has not heard from me regarding xarelto by the end of the week.

## 2023-01-20 NOTE — Telephone Encounter (Signed)
Inbound call from patient returning phone call. Requesting a call back. Please advise, thank you.  

## 2023-01-20 NOTE — Telephone Encounter (Signed)
Mansouraty, Netty Starring., MD  Marily Lente, RN Cc: Loretha Stapler, RN; Raymondo Band; Shanon Ace, PA-C Team, I can't see any images as [place].   Alexia Freestone, try to add this patient to July 1 as I should still have a slot. Please obtain a Ca19-9 and a Chromogranin A level. Thanks. GM

## 2023-01-20 NOTE — Telephone Encounter (Signed)
Notified Patient of prior authorization approval for Oxycodone-Acetaminophen 5/325 mg Tablets. Medication is approved through 01/20/2024. No other needs or concerns voiced at this time.

## 2023-01-20 NOTE — Telephone Encounter (Signed)
Left message on machine to call back  

## 2023-01-20 NOTE — Telephone Encounter (Signed)
-----   Message from Lemar Lofty., MD sent at 01/20/2023  1:43 PM EDT ----- Regarding: RE: Urgent GI referral pancreatic mass Will need anticoagulation hold as well. GM ----- Message ----- From: Kandice Hams Sent: 01/20/2023   8:56 AM EDT To: Loretha Stapler, RN; Lemar Lofty., MD; # Subject: Urgent GI referral pancreatic mass             Dr. Meridee Score,  Patient had incidental finding of mass of the ventral tip of the pancreatic tail. She is scheduled to see Dr. Donell Beers on 02/06/23 and we are hoping to get her into GI prior to that for an EUS.   No personal oncologic history- she is followed at the [facility] for history of DVTs.   Thank you, Karie Fetch.

## 2023-01-20 NOTE — Telephone Encounter (Signed)
EUS has been set up for EUS on 7/1 at 9 am at [facility] with Aurelio Jew hold has been sent to Dr Maisie Fus.

## 2023-01-21 ENCOUNTER — Encounter (HOSPITAL_COMMUNITY): Payer: Self-pay | Admitting: Gastroenterology

## 2023-01-21 ENCOUNTER — Encounter: Payer: Self-pay | Admitting: Gastroenterology

## 2023-01-21 ENCOUNTER — Telehealth: Payer: Self-pay

## 2023-01-21 ENCOUNTER — Other Ambulatory Visit: Payer: Self-pay | Admitting: Physician Assistant

## 2023-01-21 DIAGNOSIS — K869 Disease of pancreas, unspecified: Secondary | ICD-10-CM

## 2023-01-21 MED ORDER — ENOXAPARIN SODIUM 80 MG/0.8ML IJ SOSY: 80.0000 mg | PREFILLED_SYRINGE | Freq: Two times a day (BID) | INTRAMUSCULAR | 0 refills | Status: DC

## 2023-01-21 NOTE — Progress Notes (Signed)
I spoke with Alexis Lindsey and reviewed need for lab appt, f/u appt with Dr Leonides Schanz and holding [place]amp with lovenox bridge: Hold Xeralto on 6/29-01/26/2023, ok to resume on 01/27/2023 if no bleeding after EUS.  Take Lovenox 6/29 and 01/25/2023 twice daily 12 hours apart. I instructed her not to take any anticoagulation medication on 7/1.  After her lab appt tomorrow I will provide self injection education.  All questions were answered.  She verbalized understanding.

## 2023-01-21 NOTE — Telephone Encounter (Signed)
-----   Message from Briant Cedar, [place] sent at 01/21/2023 11:03 AM EDT ----- Hi ,   Plan for holding Xarelto is the following: Hold Xarelto 48 hours before EUS and start taking Lovenox 1 mg/kg every 12 hours. Hold lovenox 12 hours before EUS. Can resume Xarelto after EUS once Dr. Meridee Score confirms hemostasis.  I will send prescription for Lovenox and Clydie Braun can call the patient to go over the instructions.   We will also arrange a lab only appt to check tumor markers.  Thanks! Karena Addison  ----- Message ----- From: Loretha Stapler, RN Sent: 01/21/2023  10:45 AM EDT To: Briant Cedar, PA-C  Karena Addison this pt is on Xarelto for DVT but has a pancreatic mass and needs EUS.  Can you please review for Xarelto hold prior to the EUS?  Her appt is 7/1. Thank you Hilma Favors RN.  You can reach me at [phone] if you have any questions.

## 2023-01-21 NOTE — Telephone Encounter (Signed)
Plan for holding Xarelto is the following:  Hold Xarelto 48 hours before EUS and start taking Lovenox 1 mg/kg every 12 hours. Hold lovenox 12 hours before EUS. Can resume Xarelto after EUS once Dr. Meridee Score confirms hemostasis.   I will send prescription for Lovenox and Clydie Braun can call the patient to go over the instructions.   We will also arrange a lab only appt to check tumor markers.   Thanks!  Karena Addison    Noted

## 2023-01-22 ENCOUNTER — Inpatient Hospital Stay: Payer: BC Managed Care – PPO

## 2023-01-22 DIAGNOSIS — Z90710 Acquired absence of both cervix and uterus: Secondary | ICD-10-CM | POA: Diagnosis not present

## 2023-01-22 DIAGNOSIS — Z7901 Long term (current) use of anticoagulants: Secondary | ICD-10-CM | POA: Diagnosis not present

## 2023-01-22 DIAGNOSIS — I82622 Acute embolism and thrombosis of deep veins of left upper extremity: Secondary | ICD-10-CM | POA: Diagnosis not present

## 2023-01-22 DIAGNOSIS — K8689 Other specified diseases of pancreas: Secondary | ICD-10-CM

## 2023-01-22 DIAGNOSIS — Z87891 Personal history of nicotine dependence: Secondary | ICD-10-CM | POA: Diagnosis not present

## 2023-01-22 DIAGNOSIS — K869 Disease of pancreas, unspecified: Secondary | ICD-10-CM

## 2023-01-22 DIAGNOSIS — Z809 Family history of malignant neoplasm, unspecified: Secondary | ICD-10-CM | POA: Diagnosis not present

## 2023-01-22 LAB — CMP (CANCER CENTER ONLY)
ALT: 27 U/L (ref 0–44)
AST: 16 U/L (ref 15–41)
Albumin: 4.1 g/dL (ref 3.5–5.0)
Alkaline Phosphatase: 58 U/L (ref 38–126)
Anion gap: 5 (ref 5–15)
BUN: 12 mg/dL (ref 6–20)
CO2: 28 mmol/L (ref 22–32)
Calcium: 9.4 mg/dL (ref 8.9–10.3)
Chloride: 105 mmol/L (ref 98–111)
Creatinine: 1.04 mg/dL — ABNORMAL HIGH (ref 0.44–1.00)
GFR, Estimated: >60 mL/min (ref >60)
Glucose, Bld: 83 mg/dL (ref 70–99)
Potassium: 4.3 mmol/L (ref 3.5–5.1)
Sodium: 138 mmol/L (ref 135–145)
Total Bilirubin: 0.7 mg/dL (ref 0.3–1.2)
Total Protein: 7.4 g/dL (ref 6.5–8.1)

## 2023-01-22 LAB — CBC WITH DIFFERENTIAL (CANCER CENTER ONLY)
Abs Immature Granulocytes: 0.04 10*3/uL (ref 0.00–0.07)
Basophils Absolute: 0.1 10*3/uL (ref 0.0–0.1)
Basophils Relative: 1 %
Eosinophils Absolute: 0.3 10*3/uL (ref 0.0–0.5)
Eosinophils Relative: 3 %
HCT: 45.6 % (ref 36.0–46.0)
Hemoglobin: 15.2 g/dL — ABNORMAL HIGH (ref 12.0–15.0)
Immature Granulocytes: 1 %
Lymphocytes Relative: 19 %
Lymphs Abs: 1.6 10*3/uL (ref 0.7–4.0)
MCH: 29.6 pg (ref 26.0–34.0)
MCHC: 33.3 g/dL (ref 30.0–36.0)
MCV: 88.9 fL (ref 80.0–100.0)
Monocytes Absolute: 0.4 10*3/uL (ref 0.1–1.0)
Monocytes Relative: 4 %
Neutro Abs: 6.3 10*3/uL (ref 1.7–7.7)
Neutrophils Relative %: 72 %
Platelet Count: 247 10*3/uL (ref 150–400)
RBC: 5.13 MIL/uL — ABNORMAL HIGH (ref 3.87–5.11)
RDW: 12.3 % (ref 11.5–15.5)
WBC Count: 8.7 10*3/uL (ref 4.0–10.5)
nRBC: 0 % (ref 0.0–0.2)

## 2023-01-22 LAB — CEA (ACCESS): CEA (CHCC): 1.57 ng/mL (ref 0.00–5.00)

## 2023-01-22 NOTE — Progress Notes (Signed)
I met with Ms Alexis Lindsey this afternoon.  We reviewed steps for self administered lovenox.  I provided demonstration which she demonstrated back.  I provided written instructions as well.  I again reviewed instructions to hold xeralto starting Saturday 6/29 and resuming 7/1, if Dr Meridee Score determines there is no bleeding at the biopsy site.  She is to start lovenox twice daily 12 hours apart on 6/29 with last dose 6/30 pm.  All questions were answered.  she verbalized understanding.

## 2023-01-23 LAB — CANCER ANTIGEN 19-9: CA 19-9: 7 U/mL (ref 0–35)

## 2023-01-26 ENCOUNTER — Ambulatory Visit (HOSPITAL_COMMUNITY): Payer: BC Managed Care – PPO

## 2023-01-26 ENCOUNTER — Ambulatory Visit (HOSPITAL_COMMUNITY)
Admission: RE | Admit: 2023-01-26 | Discharge: 2023-01-26 | Disposition: A | Discharge status: Home / Self Care | Payer: BC Managed Care – PPO | Attending: Gastroenterology | Admitting: Gastroenterology

## 2023-01-26 ENCOUNTER — Encounter (HOSPITAL_COMMUNITY): Payer: Self-pay | Admitting: Gastroenterology

## 2023-01-26 ENCOUNTER — Encounter (HOSPITAL_COMMUNITY)
Admission: RE | Disposition: A | Discharge status: Home / Self Care | Payer: Self-pay | Source: Home / Self Care | Attending: Gastroenterology

## 2023-01-26 ENCOUNTER — Ambulatory Visit (HOSPITAL_COMMUNITY): Payer: BC Managed Care – PPO | Admitting: Anesthesiology

## 2023-01-26 ENCOUNTER — Other Ambulatory Visit: Payer: Self-pay

## 2023-01-26 DIAGNOSIS — R109 Unspecified abdominal pain: Secondary | ICD-10-CM | POA: Diagnosis not present

## 2023-01-26 DIAGNOSIS — R140 Abdominal distension (gaseous): Secondary | ICD-10-CM | POA: Diagnosis not present

## 2023-01-26 DIAGNOSIS — K869 Disease of pancreas, unspecified: Secondary | ICD-10-CM | POA: Diagnosis not present

## 2023-01-26 DIAGNOSIS — K449 Diaphragmatic hernia without obstruction or gangrene: Secondary | ICD-10-CM | POA: Diagnosis not present

## 2023-01-26 DIAGNOSIS — I899 Noninfective disorder of lymphatic vessels and lymph nodes, unspecified: Secondary | ICD-10-CM

## 2023-01-26 DIAGNOSIS — F32A Depression, unspecified: Secondary | ICD-10-CM | POA: Insufficient documentation

## 2023-01-26 DIAGNOSIS — Z86718 Personal history of other venous thrombosis and embolism: Secondary | ICD-10-CM | POA: Diagnosis not present

## 2023-01-26 DIAGNOSIS — K8689 Other specified diseases of pancreas: Secondary | ICD-10-CM | POA: Insufficient documentation

## 2023-01-26 DIAGNOSIS — Z87891 Personal history of nicotine dependence: Secondary | ICD-10-CM | POA: Diagnosis not present

## 2023-01-26 DIAGNOSIS — K3189 Other diseases of stomach and duodenum: Secondary | ICD-10-CM | POA: Diagnosis not present

## 2023-01-26 DIAGNOSIS — F419 Anxiety disorder, unspecified: Secondary | ICD-10-CM | POA: Insufficient documentation

## 2023-01-26 DIAGNOSIS — D3A8 Other benign neuroendocrine tumors: Secondary | ICD-10-CM | POA: Diagnosis not present

## 2023-01-26 HISTORY — PX: BIOPSY: SHX5522

## 2023-01-26 HISTORY — PX: ESOPHAGOGASTRODUODENOSCOPY (EGD) WITH PROPOFOL: SHX5813

## 2023-01-26 HISTORY — PX: FINE NEEDLE ASPIRATION: SHX6590

## 2023-01-26 HISTORY — PX: EUS: SHX5427

## 2023-01-26 LAB — CHROMOGRANIN A: Chromogranin A (ng/mL): 78.3 ng/mL (ref 0.0–101.8)

## 2023-01-26 SURGERY — UPPER ENDOSCOPIC ULTRASOUND (EUS) RADIAL
Anesthesia: Monitor Anesthesia Care

## 2023-01-26 MED ORDER — ONDANSETRON HCL 4 MG/2ML IJ SOLN
4.0000 mg | Freq: Once | INTRAMUSCULAR | Status: AC
  Administered 2023-01-26: 4 mg via INTRAVENOUS

## 2023-01-26 MED ORDER — FENTANYL CITRATE (PF) 100 MCG/2ML IJ SOLN
25.0000 ug | Freq: Once | INTRAMUSCULAR | Status: AC
  Administered 2023-01-26: 25 ug via INTRAVENOUS

## 2023-01-26 MED ORDER — ENOXAPARIN SODIUM 80 MG/0.8ML IJ SOSY: 80.0000 mg | PREFILLED_SYRINGE | Freq: Two times a day (BID) | INTRAMUSCULAR | 0 refills | Status: DC

## 2023-01-26 MED ORDER — ONDANSETRON HCL 4 MG/2ML IJ SOLN
INTRAMUSCULAR | Status: AC
  Filled 2023-01-26: qty 2

## 2023-01-26 MED ORDER — CIPROFLOXACIN IN D5W 400 MG/200ML IV SOLN
INTRAVENOUS | Status: DC | PRN
  Administered 2023-01-26: 400 mg via INTRAVENOUS

## 2023-01-26 MED ORDER — SODIUM CHLORIDE 0.9 % IV SOLN: INTRAVENOUS | Status: DC

## 2023-01-26 MED ORDER — CIPROFLOXACIN IN D5W 400 MG/200ML IV SOLN
INTRAVENOUS | Status: AC
  Filled 2023-01-26: qty 200

## 2023-01-26 MED ORDER — PROPOFOL 500 MG/50ML IV EMUL
INTRAVENOUS | Status: AC
  Filled 2023-01-26: qty 50

## 2023-01-26 MED ORDER — LACTATED RINGERS IV BOLUS: 1000.0000 mL | Freq: Once | INTRAVENOUS | Status: DC

## 2023-01-26 MED ORDER — PROPOFOL 1000 MG/100ML IV EMUL
INTRAVENOUS | Status: AC
  Filled 2023-01-26: qty 100

## 2023-01-26 MED ORDER — MIDAZOLAM HCL 2 MG/2ML IJ SOLN
INTRAMUSCULAR | Status: AC
  Filled 2023-01-26: qty 2

## 2023-01-26 MED ORDER — RIVAROXABAN 20 MG PO TABS: 20.0000 mg | ORAL_TABLET | Freq: Every day | ORAL | 5 refills | Status: DC

## 2023-01-26 MED ORDER — CIPROFLOXACIN HCL 500 MG PO TABS: 500.0000 mg | ORAL_TABLET | Freq: Two times a day (BID) | ORAL | 0 refills | Status: AC

## 2023-01-26 MED ORDER — PROPOFOL 500 MG/50ML IV EMUL
INTRAVENOUS | Status: DC | PRN
  Administered 2023-01-26: 150 ug/kg/min via INTRAVENOUS

## 2023-01-26 MED ORDER — FENTANYL CITRATE (PF) 100 MCG/2ML IJ SOLN
INTRAMUSCULAR | Status: AC
  Filled 2023-01-26: qty 2

## 2023-01-26 MED ORDER — ACETAMINOPHEN 10 MG/ML IV SOLN
1000.0000 mg | Freq: Once | INTRAVENOUS | Status: AC
  Administered 2023-01-26: 1000 mg via INTRAVENOUS
  Filled 2023-01-26: qty 100

## 2023-01-26 MED ORDER — LACTATED RINGERS IV SOLN: INTRAVENOUS | Status: DC

## 2023-01-26 NOTE — Op Note (Signed)
[facility] Patient Name: Alexis Lindsey Procedure Date: 01/26/2023 MRN: [id number] Attending MD: Corliss Parish , MD, [id number] Date of Birth: 1981/07/26 CSN: [id number] Age: 42 Admit Type: Outpatient Procedure:                Upper EUS Indications:              Suspected mass in pancreas on CT scan, Suspected                            mass in pancreas on MRI, Epigastric abdominal pain Providers:                Corliss Parish, MD, Margaree Mackintosh, RN,                            Salley Scarlet, Technician, Mirian Mo, CRNA Referring MD:              Medicines:                Monitored Anesthesia Care, Cipro 400 mg IV Complications:            No immediate complications. Estimated Blood Loss:     Estimated blood loss was minimal. Procedure:                Pre-Anesthesia Assessment:                           - Prior to the procedure, a History and Physical                            was performed, and patient medications and                            allergies were reviewed. The patient's tolerance of                            previous anesthesia was also reviewed. The risks                            and benefits of the procedure and the sedation                            options and risks were discussed with the patient.                            All questions were answered, and informed consent                            was obtained. Prior Anticoagulants: The patient                            last took Lovenox (enoxaparin) 1 day and Xarelto                            (rivaroxaban) 2 days prior to the procedure. ASA  Grade Assessment: II - A patient with mild systemic                            disease. After reviewing the risks and benefits,                            the patient was deemed in satisfactory condition to                            undergo the procedure.                           After obtaining  informed consent, the endoscope was                            passed under direct vision. Throughout the                            procedure, the patient's blood pressure, pulse, and                            oxygen saturations were monitored continuously. The                            [id number]-H190 ([id number]) Olympus endoscope was introduced                            through the mouth, and advanced to the second part                            of duodenum. The [id number]-[id number] ([id number]) Olympus                            duodenoscope was introduced through the mouth, and                            advanced to the area of papilla. The [id number]                            ([id number]) Olympus linear ultrasound scope was                            introduced through the mouth, and advanced to the                            duodenum for ultrasound examination from the                            stomach and duodenum. The upper EUS was                            accomplished without difficulty. The patient  tolerated the procedure. Scope In: Scope Out: Findings:      ENDOSCOPIC FINDING: :      No gross lesions were noted in the entire esophagus.      The Z-line was regular and was found 39 cm from the incisors.      A 1 cm hiatal hernia was present.      Striped mildly erythematous mucosa without bleeding was found in the       gastric antrum.      No other gross lesions were noted in the entire examined stomach.       Biopsies were taken with a cold forceps for histology and Helicobacter       pylori testing.      No gross lesions were noted in the duodenal bulb, in the first portion       of the duodenum and in the second portion of the duodenum.      The major papilla was normal.      ENDOSONOGRAPHIC FINDING: :      An irregular mass was identified in the very distal pancreatic tail. The       mass was mixed solid and cystic (though mostly solid). The mass in total        measured 25 mm by 21 mm in maximal cross-sectional diameter (cystic       portion was 11 mm x 6 mm). The endosonographic borders were       well-defined. There was sonographic evidence suggesting invasion into       the splenic artery (manifested by abutment). An intact interface was       seen between the mass and the superior mesenteric artery and celiac       trunk suggesting a lack of invasion. The remainder of the pancreas was       examined. The endosonographic appearance of parenchyma and the upstream       pancreatic duct indicated no duct dilation and that the remainder of the       pancreas was unremarkable (PDH = 1.2 mm, PDN = 0.8 mm, PDB = 0.8 mm, PDT       = 0.5 mm). Fine needle biopsy was performed. Color Doppler imaging was       utilized prior to needle puncture to confirm a lack of significant       vascular structures within the needle path. Six passes were made with       the 22 gauge Acquire biopsy needle using a transgastric approach.       Visible cores of tissue were obtained. Preliminary cytologic examination       and touch preps were performed. Final cytology results are pending.      There was no sign of significant endosonographic abnormality in the       common bile duct (2.6 mm -> 3.4 mm) and in the common hepatic duct (4.6       mm). Ducts of normal caliber were identified.      Endosonographic imaging of the ampulla showed no intramural       (subepithelial) lesion.      No malignant-appearing lymph nodes were visualized in the celiac region       (level 20), peripancreatic region and porta hepatis region.      Endosonographic imaging in the visualized portion of the liver showed no       mass.      The celiac region was visualized. Impression:  EGD impression:                           - No gross lesions in the entire esophagus. Z-line                            regular, 39 cm from the incisors.                           - 1 cm hiatal  hernia.                           - Erythematous mucosa in the antrum. No other gross                            lesions in the entire stomach. Biopsied.                           - No gross lesions in the duodenal bulb, in the                            first portion of the duodenum and in the second                            portion of the duodenum.                           - Normal major papilla.                           EUS Impression:                           - A mixed solid/cystic mass was identified in the                            pancreatic tail. Cytology results are pending.                            However, the endosonographic appearance is                            suspicious for a neuroendocrine tumor more so than                            adenocarcinoma (splenic echotexture was slightly                            different from this area so makes splenule less                            likely). This was staged T2 N0 Mx by                            endosonographic criteria and the staging  applies if                            malignancy is confirmed. Fine needle biopsy                            performed.                           - There was no sign of significant pathology in the                            common bile duct and in the common hepatic duct.                           - No malignant-appearing lymph nodes were                            visualized in the celiac region (level 20),                            peripancreatic region and porta hepatis region. Moderate Sedation:      Not Applicable - Patient had care per Anesthesia. Recommendation:           - The patient will be observed post-procedure,                            until all discharge criteria are met.                           - Discharge patient to home.                           - Low fat diet for 1 week.                           - Await cytology results and await path results.                            - Ciprofloxacin 500 mg twice daily for 3 days to                            decrease post interventional infectious risk.                           - May restart Lovenox on 7/2.                           - May restart Xarelto on 7/3.                           - May continue medications otherwise.                           - Monitor for signs/symptoms of bleeding,  perforation, and infection. If issues please call                            our number to get further assistance as needed.                           - The findings and recommendations were discussed                            with the patient.                           - The findings and recommendations were discussed                            with the patient's family. Procedure Code(s):        --- Professional ---                           [phone], Esophagogastroduodenoscopy, flexible,                            transoral; with transendoscopic ultrasound-guided                            intramural or transmural fine needle                            aspiration/biopsy(s), (includes endoscopic                            ultrasound examination limited to the esophagus,                            stomach or duodenum, and adjacent structures)                           43239, 59, Esophagogastroduodenoscopy, flexible,                            transoral; with biopsy, single or multiple Diagnosis Code(s):        --- Professional ---                           K44.9, Diaphragmatic hernia without obstruction or                            gangrene                           K31.89, Other diseases of stomach and duodenum                           K86.89, Other specified diseases of pancreas                           I89.9, Noninfective disorder of lymphatic  vessels                            and lymph nodes, unspecified                           R93.3, Abnormal findings on diagnostic imaging of                             other parts of digestive tract                           R10.13, Epigastric pain CPT copyright 2022 American [employer] Association. All rights reserved. The codes documented in this report are preliminary and upon coder review may  be revised to meet current compliance requirements. Corliss Parish, MD 01/26/2023 10:31:39 AM Number of Addenda: 0
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
## 2023-01-26 NOTE — Anesthesia Preprocedure Evaluation (Addendum)
Anesthesia Evaluation  Patient identified by MRN, date of birth, ID band Patient awake    Reviewed: Allergy & Precautions, NPO status , Patient's Chart, lab work & pertinent test results  History of Anesthesia Complications (+) PONV and history of anesthetic complications  Airway Mallampati: II  TM Distance: >3 FB Neck ROM: Full    Dental no notable dental hx.    Pulmonary former smoker   Pulmonary exam normal        Cardiovascular + DVT  Normal cardiovascular exam     Neuro/Psych  Headaches PSYCHIATRIC DISORDERS Anxiety Depression       GI/Hepatic negative GI ROS, Neg liver ROS,,,  Endo/Other  negative endocrine ROS    Renal/GU Renal disease     Musculoskeletal negative musculoskeletal ROS (+)    Abdominal   Peds  Hematology  (+) Blood dyscrasia (Lovenox)   Anesthesia Other Findings Pancreatic mass  Reproductive/Obstetrics S/p hysterectomy                             Anesthesia Physical Anesthesia Plan  ASA: 2  Anesthesia Plan: MAC   Post-op Pain Management:    Induction: Intravenous  PONV Risk Score and Plan: 3 and Propofol infusion and Treatment may vary due to age or medical condition  Airway Management Planned: Nasal Cannula  Additional Equipment:   Intra-op Plan:   Post-operative Plan:   Informed Consent: I have reviewed the patients History and Physical, chart, labs and discussed the procedure including the risks, benefits and alternatives for the proposed anesthesia with the patient or authorized representative who has indicated his/her understanding and acceptance.     Dental advisory given  Plan Discussed with: CRNA  Anesthesia Plan Comments:        Anesthesia Quick Evaluation

## 2023-01-26 NOTE — Transfer of Care (Signed)
Immediate Anesthesia Transfer of Care Note  Patient: Alexis Lindsey  Procedure(s) Performed: UPPER ENDOSCOPIC ULTRASOUND (EUS) RADIAL BIOPSY FINE NEEDLE ASPIRATION  Patient Location: PACU  Anesthesia Type:MAC  Level of Consciousness: sedated, patient cooperative, and responds to stimulation  Airway & Oxygen Therapy: Patient Spontanous Breathing and Patient connected to face mask oxygen  Post-op Assessment: Report given to RN and Post -op Vital signs reviewed and stable  Post vital signs: Reviewed and stable  Last Vitals:  Vitals Value Taken Time  BP 127/85 01/26/23 1016  Temp    Pulse 109 01/26/23 1019  Resp 15 01/26/23 1019  SpO2 100 % 01/26/23 1019  Vitals shown include unvalidated device data.  Last Pain:  Vitals:   01/26/23 0757  TempSrc: Temporal  PainSc: 0-No pain         Complications: No notable events documented.

## 2023-01-26 NOTE — Progress Notes (Signed)
Pt presented to endoscopy today for upper EUS with MD Mansouraty. Post-operatively, pt reported 4/10 LUQ pain which rapidly progressed to 10/10. MD Mansouraty notified. VO for 25 mcg fentanyl IV push. Pt then began endorsing nausea. MDA Ellender notified. VO for 4 mg zofran IV push. Medications given as ordered. Hot pack applied to abdomen.   At approximately 1030, MD Mansouraty assessed pt at bedside. VO for an additional 25 mcg fentanyl IV push. Medication given as ordered (see MAR). As of 1030, pain remains 9/10 localized to the LUQ.  X-ray at bedside at 1040 for CXR and KUB. Pt reports slight interval improvement of pain with pain of 7/10. MD Mansouraty at bedside. Orders for 25 mcg fentanyl IV push and 1000 mg of acetaminophen IV. Medication given as ordered.  As of 1110, MD Mansouraty at bedside. Pt reports improvement of pain, rates pain a 4/10. IV tylenol infusing. Pt reports that pain is now at a manageable level. Pt able to tolerate PO liquids.  As of 1130, Tylenol infusion complete. Pt reports 4/10 pain which is manageable. Pt continuing to tolerate PO liquids without issue. MD Mansouraty notified. Per MD Mansouraty, infuse remainder of LR. MD Mansouraty to place order for additional 25 mcg of fentanyl. MDA Ellender notified and is okay with plan.  As of 1220, Pt reports improvement in pain, rating pain at 3/10, which she maintains is a manageable level of pain. LR infusion completed. Pt up to restroom with minimal assist. MD Mansouraty notified. Per MD Mansouraty, okay to discharge. Pt discharged to home with post-procedural care instructions at 1230.  Eulas Post, RN 01/26/23 12:31 PM

## 2023-01-26 NOTE — Progress Notes (Addendum)
Patient evaluated post procedure She is complaining of abdominal pain in the left upper quadrant. She will be given fentanyl for postprocedural pain, with concern for possible post EUS pancreatitis. Chest x-ray and KUB have been ordered and will be done as stat. If patient is unable to have adequate pain control, she will need to come into the hospital for postprocedural pain control. LR 1 L bolus has been ordered.   Corliss Parish, MD [facility] Office # [phone]   Patient has received 50 mcg of fentanyl still having 6 out of 10 pain in the left upper quadrant without radiation elsewhere. She will receive another 25 mcg of fentanyl as well as a single dose of IV Tylenol. I will give her another 15 minutes or so to see how she is doing otherwise I will need to ask our hospitalist team to bring in for post procedure pain, most likely pancreatitis and observation.  Also for monitoring closely that if patient has progressive issues or symptoms for cross-sectional imaging to be obtained. We will await the KUB and chest x-ray to ensure no severe postprocedural complications are noted today. Low molecular weight heparin can be restarted tomorrow. Xarelto can be started in 2 days. Will reevaluate. Patient's husband and mother have been updated.  Corliss Parish, MD [facility] Office # [phone]   [phone] Addendum Patient feeling better after IV Tylenol and Fentanyl (75 mcg total received).  She is completing 1L LR. Pain manageable and similar to what she has had previously over the last few weeks. We discussed admission for Obs v close monitoring at home, and she and husband would like to go home and monitor. I have advocated for CLD today and soft low-fat diet thereafter. I have sent Rx for LMWH Lovenox (2 shots) to restart tomorrow and then end. I have sent Rx for Ciprofloxacin. She may restart Xarelto on 7/3 and  will not need further LMWH. She will call if issues develop or things progress at any time over the next few days.   Corliss Parish, MD [facility] Office # [phone]

## 2023-01-26 NOTE — Discharge Instructions (Addendum)
You may restart your Xarelto in 2 days (Wednesday July 3).   Dr Meridee Score has prescribed Ciprofloxacin (an antibiotic) for you. Take this twice a day for the next 3 days.   YOU HAD AN ENDOSCOPIC PROCEDURE TODAY: Refer to the procedure report and other information in the discharge instructions given to you for any specific questions about what was found during the examination. If this information does not answer your questions, please call [place] office at [phone] to clarify.   YOU SHOULD EXPECT: Some feelings of bloating in the abdomen. Passage of more gas than usual. Walking can help get rid of the air that was put into your GI tract during the procedure and reduce the bloating.  DIET: You may have a clear liquid diet today. Tomorrow, begin a soft, low-fat diet. Your first meal following the procedure should be a light meal and then it is ok to progress to your normal diet. A half-sandwich or bowl of soup is an example of a good first meal. Heavy or fried foods are harder to digest and may make you feel nauseous or bloated. Drink plenty of fluids but you should avoid alcoholic beverages for 24 hours.  ACTIVITY: Your care partner should take you home directly after the procedure. You should plan to take it easy, moving slowly for the rest of the day. You can resume normal activity the day after the procedure however YOU SHOULD NOT DRIVE, use power tools, machinery or perform tasks that involve climbing or major physical exertion for 24 hours (because of the sedation medicines used during the test).   SYMPTOMS TO REPORT IMMEDIATELY: A gastroenterologist can be reached at any hour. Please call [phone]  for any of the following symptoms:   Following upper endoscopy (EGD, EUS, ERCP, esophageal dilation) Vomiting of blood or coffee ground material  New, significant abdominal pain  New, significant chest pain or pain under the shoulder blades  Painful or persistently difficult swallowing   New shortness of breath  Black, tarry-looking or red, bloody stools  FOLLOW UP:  If any biopsies were taken you will be contacted by phone or by letter within the next 1-3 weeks. Call [phone]  if you have not heard about the biopsies in 3 weeks.  Please also call with any specific questions about appointments or follow up tests.

## 2023-01-27 LAB — SURGICAL PATHOLOGY

## 2023-01-27 NOTE — Anesthesia Postprocedure Evaluation (Signed)
Anesthesia Post Note  Patient: Alexis Lindsey  Procedure(s) Performed: UPPER ENDOSCOPIC ULTRASOUND (EUS) RADIAL BIOPSY FINE NEEDLE ASPIRATION     Patient location during evaluation: Endoscopy Anesthesia Type: MAC Level of consciousness: awake Pain management: pain level controlled Vital Signs Assessment: post-procedure vital signs reviewed and stable Respiratory status: spontaneous breathing, nonlabored ventilation and respiratory function stable Cardiovascular status: blood pressure returned to baseline and stable Postop Assessment: no apparent nausea or vomiting Anesthetic complications: no   No notable events documented.  Last Vitals:  Vitals:   01/26/23 1210 01/26/23 1220  BP: 114/66 117/68  Pulse: 63 63  Resp: 12 16  Temp:    SpO2: 98% 98%    Last Pain:  Vitals:   01/26/23 1220  TempSrc:   PainSc: 3                   P 

## 2023-01-28 ENCOUNTER — Encounter: Payer: Self-pay | Admitting: Gastroenterology

## 2023-01-29 ENCOUNTER — Encounter (HOSPITAL_COMMUNITY): Payer: Self-pay | Admitting: Gastroenterology

## 2023-01-30 LAB — CYTOLOGY - NON PAP

## 2023-02-02 ENCOUNTER — Inpatient Hospital Stay: Payer: BC Managed Care – PPO | Attending: Physician Assistant | Admitting: Hematology and Oncology

## 2023-02-02 VITALS — BP 124/83 | HR 102 | Temp 98.1°F | Resp 18 | Wt 170.0 lb

## 2023-02-02 DIAGNOSIS — K869 Disease of pancreas, unspecified: Secondary | ICD-10-CM | POA: Diagnosis not present

## 2023-02-02 DIAGNOSIS — K8689 Other specified diseases of pancreas: Secondary | ICD-10-CM

## 2023-02-02 DIAGNOSIS — Z86718 Personal history of other venous thrombosis and embolism: Secondary | ICD-10-CM

## 2023-02-02 DIAGNOSIS — I82622 Acute embolism and thrombosis of deep veins of left upper extremity: Secondary | ICD-10-CM

## 2023-02-02 DIAGNOSIS — C7A8 Other malignant neuroendocrine tumors: Secondary | ICD-10-CM | POA: Insufficient documentation

## 2023-02-02 DIAGNOSIS — Z87891 Personal history of nicotine dependence: Secondary | ICD-10-CM | POA: Diagnosis not present
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
## 2023-02-06 ENCOUNTER — Ambulatory Visit: Payer: Self-pay | Admitting: Surgery

## 2023-02-06 DIAGNOSIS — D3A8 Other benign neuroendocrine tumors: Secondary | ICD-10-CM | POA: Diagnosis not present

## 2023-02-06 DIAGNOSIS — I82412 Acute embolism and thrombosis of left femoral vein: Secondary | ICD-10-CM | POA: Diagnosis not present

## 2023-02-06 NOTE — H&P (Signed)
History of Present Illness: Alexis Lindsey is a 42 y.o. female who is seen today as an office consultation for evaluation of New Consultation (Pancreatic lesion)   She has a history of upper extremity DVT and completed 3 months anticoagulation in 2023; this was felt to be provoked by a recent IV. She more recently developed acute LLE pain and swelling on 5/20 and was found to have an occlusive thrombus in the left popliteal and femoral veins. She followed up with hematology and had a CT venogram on 6/19 to rule out May-Thurner syndrome, and this incidentally showed a mass in the tail of the pancreas. She had an MRI on 6/23, which showed a 2.7cm heterogenous mass in the tail of the pancreas. Then then underwent an EUS with biopsy on 7/1 by Dr. Meridee Score, which showed a 2.5cm mass, primarily solid, staged as a uT2N0. 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 She was referred to discuss surgical resection. She reports some upper abdominal pain, and mostly takes Tylenol. She denies diarrhea but reports flushing and night sweats.   She is on Xarelto for treatment of her DVT, and previous hypercoagulability workup was negative. As this is an unprovoked DVT and recurrent, she is to be anticoagulated indefinitely by Dr. Leonides Schanz. Prior abdominal surgeries include a C section.       Review of Systems: A complete review of systems was obtained from the patient.  I have reviewed this information and discussed as appropriate with the patient.  See HPI as well for other ROS.     Medical History: Past Medical History Past Medical History: Diagnosis Date  Anxiety    DVT (deep venous thrombosis) (CMS/HHS-HCC)    History of cancer         Problem List There is no problem list on file for this patient.     Past Surgical History Past Surgical History: Procedure Laterality Date  CESAREAN SECTION      FACIAL SURGERY      HYSTERECTOMY           Allergies Allergies Allergen Reactions  Codeine Vomiting  Macrobid [Nitrofurantoin Monohyd/M-Cryst] Vomiting      Medications Ordered Prior to Encounter Current Outpatient Medications on File Prior to Visit Medication Sig Dispense Refill  acetaminophen (TYLENOL) 500 MG tablet Take by mouth      buPROPion (WELLBUTRIN XL) 150 MG XL tablet Take 450 mg by mouth once daily      clonazePAM (KLONOPIN) 1 MG tablet Take 0.5-1 mg by mouth 2 (two) times daily as needed      ondansetron (ZOFRAN-ODT) 4 MG disintegrating tablet Take 4 mg by mouth every 8 (eight) hours as needed      oxyCODONE-acetaminophen (PERCOCET) 5-325 mg tablet TAKE 1 TABLET BY MOUTH EVERY 6 HOURS FOR UP TO 10 DAYS AS NEEDED FOR SEVERE PAIN      rivaroxaban (XARELTO) 20 mg tablet Take 20 mg by mouth daily with dinner      VYVANSE 70 mg capsule Take 70 mg by mouth every morning      zolpidem (AMBIEN) 10 mg tablet Take 10 mg by mouth at bedtime        No current facility-administered medications on file prior to visit.      Family History Family History Problem Relation Age of Onset  Breast cancer Mother    High blood pressure (Hypertension) Mother    Hyperlipidemia (Elevated cholesterol) Mother    Coronary Artery Disease (Blocked arteries around heart) Mother  Skin cancer Father    Obesity Father    High blood pressure (Hypertension) Father    Diabetes Father    Obesity Brother        Tobacco Use History Social History    Tobacco Use Smoking Status Former  Types: Cigarettes Smokeless Tobacco Never      Social History Social History    Socioeconomic History  Marital status: Married Tobacco Use  Smoking status: Former     Types: Cigarettes  Smokeless tobacco: Never Vaping Use  Vaping status: Never Used Substance and Sexual Activity  Alcohol use: Not Currently  Drug use: Never    Social Determinants of Health      Received from [employer]   Social Network      Objective:    Vitals:   02/06/23 1518 BP: 116/79 Pulse: 105 Temp: 37 C (98.6 F) SpO2: 97% Weight: 77.3 kg (170 lb 6.4 oz) Height: 175.3 cm (5\' 9" ) PainSc: 0-No pain   Body mass index is 25.16 kg/m.   Physical Exam Vitals reviewed.  Constitutional:      General: She is not in acute distress.    Appearance: Normal appearance.  HENT:     Head: Normocephalic and atraumatic.  Eyes:     General: No scleral icterus.    Conjunctiva/sclera: Conjunctivae normal.  Cardiovascular:     Rate and Rhythm: Normal rate and regular rhythm.     Heart sounds: No murmur heard. Pulmonary:     Effort: Pulmonary effort is normal. No respiratory distress.     Breath sounds: Normal breath sounds. No wheezing.  Abdominal:     General: There is no distension.     Palpations: Abdomen is soft.     Tenderness: There is no abdominal tenderness.  Musculoskeletal:        General: Normal range of motion.  Skin:    General: Skin is warm and dry.     Coloration: Skin is not jaundiced.  Neurological:     General: No focal deficit present.     Mental Status: She is alert and oriented to person, place, and time.            Labs, Imaging and Diagnostic Testing: MRI abdomen 01/18/23: IMPRESSION: 1. Mass of the ventral tip of the pancreatic tail, which is very slightly, heterogeneously hypoenhancing relative to pancreatic parenchyma and measures 2.7 x 1.9 cm. This is highly concerning for malignancy, however given patient age, mass location, and imaging characteristics, this may represent a less common neoplasm such as pancreatic neuroendocrine tumor or solid pseudopapillary tumor. This lesion is not convincingly isosignal and isoenhancing to spleen such that an intrapancreatic accessory splenule is not favored. Consider tissue sampling. 2. No evidence of lymphadenopathy or metastatic disease in the abdomen.     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 I personally reviewed her labs, imaging and referral notes. There is no evidence of metastatic disease within the abdomen, and given the low Ki67 this is low grade with low risk of metastatic disease. Given the size and her young age, I recommend resection via a  laparoscopic distal pancreatectomy. The tumor is well-circumscribed and in the most distal part of the pancreas, thus I anticipate the splenic vessels and spleen can be preserved. The details of this procedure were discussed, including the benefits and the risks of bleeding, infection, damage to surrounding structures, and risk of pancreatic fistula. In the event that splenectomy is needed, I also discussed the risk of post-splenectomy sepsis. All questions were answered and the patient agrees to proceed with surgery. Given some of her symptoms, will also check a 24-hour urine 5HIAA. Xarelto will need to be held for 48 hours prior to surgery. I will discuss with her hematologist Dr. Leonides Schanz regarding whether or not bridging anticoagulation and/or an IVC filter is indicated given her recent unprovoked DVT. Plan to schedule surgery after at least 3 months of treatment for her DVT.   Sophronia Simas, MD [facility], Hepatobiliary and Pancreatic Surgery 02/06/23 4:31 PM

## 2023-02-10 DIAGNOSIS — D3A8 Other benign neuroendocrine tumors: Secondary | ICD-10-CM | POA: Diagnosis not present

## 2023-03-09 DIAGNOSIS — C7A8 Other malignant neuroendocrine tumors: Secondary | ICD-10-CM | POA: Diagnosis not present

## 2023-03-09 DIAGNOSIS — I82409 Acute embolism and thrombosis of unspecified deep veins of unspecified lower extremity: Secondary | ICD-10-CM | POA: Diagnosis not present

## 2023-03-13 NOTE — Progress Notes (Signed)
Surgical Instructions    Your procedure is scheduled on Wednesday August 28th.  Report to [facility] Main Entrance "A" at 11 A.M., then check in with the Admitting office.  Call this number if you have problems the morning of surgery:  [phone]   If you have any questions prior to your surgery date call [phone]: Open Monday-Friday 8am-4pm If you experience any cold or flu symptoms such as cough, fever, chills, shortness of breath, etc. between now and your scheduled surgery, please notify us at the above number    Enhanced Recovery after Surgery  Enhanced Recovery after Surgery is a protocol used to improve the stress on your body and your recovery after surgery.  Patient Instructions  The day of surgery (if you do NOT have diabetes):  Drink TWO (2) Pre-Surgery Clear Ensure by __5___ pm the afternoon before surgery  Drink ONE (1) Pre-Surgery Clear Ensure by _11_ am the morning of surgery This drink was given to you during your hospital  pre-op appointment visit. Nothing else to drink after completing the  Pre-Surgery Clear Ensure.          If you have questions, please contact your surgeon's office.    Remember:  Do not eat after midnight the night before your surgery  You may drink clear liquids until 11 the morning of your surgery.   Clear liquids allowed are: Water, Non-Citrus Juices (without pulp), Carbonated Beverages, Clear Tea, Black Coffee ONLY (NO MILK, CREAM OR POWDERED CREAMER of any kind), and Gatorade      Take these medicines the morning of surgery with A SIP OF WATER: buPROPion (WELLBUTRIN XL) 150 MG 24 hr tablet     IF NEEDED  acetaminophen (TYLENOL) 500 MG tablet  clonazePAM (KLONOPIN) 1 MG tablet  ondansetron (ZOFRAN-ODT) 4 MG disintegrating tablet  oxyCODONE-acetaminophen (PERCOCET/ROXICET) 5-325 MG tablet    Follow your surgeon's instructions on when to stop Xarelto and lovenox.  If no instructions were given by your surgeon then you will  need to call the office to get those instructions.      As of today, STOP taking any Aspirin (unless otherwise instructed by your surgeon) Aleve, Naproxen, Ibuprofen, Motrin, Advil, Goody's, BC's, all herbal medications, fish oil, and all vitamins.           Do not wear jewelry or makeup. Do not wear lotions, powders, perfumes or deodorant. Do not shave 48 hours prior to surgery.   Do not bring valuables to the hospital. Do not wear nail polish, gel polish, artificial nails, or any other type of covering on natural nails (fingers and toes) If you have artificial nails or gel coating that need to be removed by a nail salon, please have this removed prior to surgery. Artificial nails or gel coating may interfere with anesthesia's ability to adequately monitor your vital signs.  [facility] is not responsible for any belongings or valuables.    Do NOT Smoke (Tobacco/Vaping)  24 hours prior to your procedure  If you use a CPAP at night, you may bring your mask for your overnight stay.   Contacts, glasses, hearing aids, dentures or partials may not be worn into surgery, please bring cases for these belongings   For patients admitted to the hospital, discharge time will be determined by your treatment team.   Patients discharged the day of surgery will not be allowed to drive home, and someone needs to stay with them for 24 hours.   SURGICAL WAITING ROOM VISITATION Patients  having surgery or a procedure may have no more than 2 support people in the waiting area - these visitors may rotate.   Children under the age of 58 must have an adult with them who is not the patient. If the patient needs to stay at the hospital during part of their recovery, the visitor guidelines for inpatient rooms apply. Pre-op nurse will coordinate an appropriate time for 1 support person to accompany patient in pre-op.  This support person may not rotate.   Please refer to  [email or web address] for the visitor guidelines for Inpatients (after your surgery is over and you are in a regular room).    Special instructions:    Oral Hygiene is also important to reduce your risk of infection.  Remember - BRUSH YOUR TEETH THE MORNING OF SURGERY WITH YOUR REGULAR TOOTHPASTE   [place]- Preparing For Surgery  Before surgery, you can play an important role. Because skin is not sterile, your skin needs to be as free of germs as possible. You can reduce the number of germs on your skin by washing with CHG (chlorahexidine gluconate) Soap before surgery.  CHG is an antiseptic cleaner which kills germs and bonds with the skin to continue killing germs even after washing.     Please do not use if you have an allergy to CHG or antibacterial soaps. If your skin becomes reddened/irritated stop using the CHG.  Do not shave (including legs and underarms) for at least 48 hours prior to first CHG shower. It is OK to shave your face.  Please follow these instructions carefully.     Shower the NIGHT BEFORE SURGERY and the MORNING OF SURGERY with CHG Soap.   If you chose to wash your hair, wash your hair first as usual with your normal shampoo. After you shampoo, rinse your hair and body thoroughly to remove the shampoo.  Then [employer] and genitals (private parts) with your normal soap and rinse thoroughly to remove soap.  After that Use CHG Soap as you would any other liquid soap. You can apply CHG directly to the skin and wash gently with a scrungie or a clean washcloth.   Apply the CHG Soap to your body ONLY FROM THE NECK DOWN.  Do not use on open wounds or open sores. Avoid contact with your eyes, ears, mouth and genitals (private parts). Wash Face and genitals (private parts)  with your normal soap.   Wash thoroughly, paying special attention to the area where your surgery will be performed.  Thoroughly rinse your body with  warm water from the neck down.  DO NOT shower/wash with your normal soap after using and rinsing off the CHG Soap.  Pat yourself dry with a CLEAN TOWEL.  Wear CLEAN PAJAMAS to bed the night before surgery  Place CLEAN SHEETS on your bed the night before your surgery  DO NOT SLEEP WITH PETS.   Day of Surgery:  Take a shower with CHG soap. Wear Clean/Comfortable clothing the morning of surgery Do not apply any deodorants/lotions.   Remember to brush your teeth WITH YOUR REGULAR TOOTHPASTE.    If you received a COVID test during your pre-op visit, it is requested that you wear a mask when out in public, stay away from anyone that may not be feeling well, and notify your surgeon if you develop symptoms. If you have been in contact with anyone that has tested positive in the last 10 days, please notify your surgeon.  Please read over the following fact sheets that you were given.

## 2023-03-16 ENCOUNTER — Other Ambulatory Visit: Payer: Self-pay

## 2023-03-16 ENCOUNTER — Encounter (HOSPITAL_COMMUNITY)
Admission: RE | Admit: 2023-03-16 | Discharge: 2023-03-16 | Disposition: A | Discharge status: Home / Self Care | Payer: BC Managed Care – PPO | Source: Ambulatory Visit | Attending: Surgery | Admitting: Surgery

## 2023-03-16 ENCOUNTER — Encounter (HOSPITAL_COMMUNITY): Payer: Self-pay

## 2023-03-16 VITALS — BP 121/79 | HR 101 | Temp 98.0°F | Resp 16 | Ht 69.0 in | Wt 174.9 lb

## 2023-03-16 DIAGNOSIS — C801 Malignant (primary) neoplasm, unspecified: Secondary | ICD-10-CM | POA: Diagnosis not present

## 2023-03-16 DIAGNOSIS — I82409 Acute embolism and thrombosis of unspecified deep veins of unspecified lower extremity: Secondary | ICD-10-CM | POA: Diagnosis not present

## 2023-03-16 DIAGNOSIS — D3A8 Other benign neuroendocrine tumors: Secondary | ICD-10-CM | POA: Diagnosis not present

## 2023-03-16 DIAGNOSIS — Z87891 Personal history of nicotine dependence: Secondary | ICD-10-CM | POA: Diagnosis not present

## 2023-03-16 DIAGNOSIS — Z01812 Encounter for preprocedural laboratory examination: Secondary | ICD-10-CM | POA: Diagnosis not present

## 2023-03-16 DIAGNOSIS — Z01818 Encounter for other preprocedural examination: Secondary | ICD-10-CM

## 2023-03-16 HISTORY — DX: Personal history of other venous thrombosis and embolism: Z86.718

## 2023-03-16 HISTORY — DX: Personal history of pneumonia (recurrent): Z87.01

## 2023-03-16 HISTORY — DX: Malignant (primary) neoplasm, unspecified: C80.1

## 2023-03-16 LAB — BASIC METABOLIC PANEL
Anion gap: 10 (ref 5–15)
BUN: 16 mg/dL (ref 6–20)
CO2: 24 mmol/L (ref 22–32)
Calcium: 9.2 mg/dL (ref 8.9–10.3)
Chloride: 103 mmol/L (ref 98–111)
Creatinine, Ser: 0.96 mg/dL (ref 0.44–1.00)
GFR, Estimated: >60 mL/min (ref >60)
Glucose, Bld: 94 mg/dL (ref 70–99)
Potassium: 4.2 mmol/L (ref 3.5–5.1)
Sodium: 137 mmol/L (ref 135–145)

## 2023-03-16 LAB — CBC
HCT: 43 % (ref 36.0–46.0)
Hemoglobin: 14.1 g/dL (ref 12.0–15.0)
MCH: 29.8 pg (ref 26.0–34.0)
MCHC: 32.8 g/dL (ref 30.0–36.0)
MCV: 90.9 fL (ref 80.0–100.0)
Platelets: 251 10*3/uL (ref 150–400)
RBC: 4.73 MIL/uL (ref 3.87–5.11)
RDW: 12.2 % (ref 11.5–15.5)
WBC: 7.9 10*3/uL (ref 4.0–10.5)
nRBC: 0 % (ref 0.0–0.2)

## 2023-03-16 LAB — PREPARE RBC (CROSSMATCH)

## 2023-03-16 NOTE — Progress Notes (Signed)
PCP - RUPASHREE VARADARAJAN  Cardiologist - denies  PPM/ICD - denies   Chest x-ray - N/A EKG - done at PCP office this year- requested from PCP Stress Test - denies ECHO - denies Cardiac Cath - denies  Sleep Study - denies   Fasting Blood Sugar - N/A   Last dose of GLP1 agonist-  N/A   Blood Thinner Instructions: last dose of Xarelto to be 8/25/234, take lovenox 8/26-8/27 per patient. Aspirin Instructions:N/A  ERAS Protcol - ERAS + 2 ensure PM + 1 ensure AM   COVID TEST- N/A   Anesthesia review: yes- follow up requested EKG  Patient denies shortness of breath, fever, cough and chest pain at PAT appointment   All instructions explained to the patient, with a verbal understanding of the material. Patient agrees to go over the instructions while at home for a better understanding.  The opportunity to ask questions was provided.
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
## 2023-03-17 NOTE — Progress Notes (Signed)
Surgical Instructions    Your procedure is scheduled on Wednesday August 28th.  Report to [facility] Main Entrance "A" at 11 A.M., then check in with the Admitting office.  Call this number if you have problems the morning of surgery:  [phone]   If you have any questions prior to your surgery date call [phone]: Open Monday-Friday 8am-4pm If you experience any cold or flu symptoms such as cough, fever, chills, shortness of breath, etc. between now and your scheduled surgery, please notify us at the above number    Enhanced Recovery after Surgery  Enhanced Recovery after Surgery is a protocol used to improve the stress on your body and your recovery after surgery.  Patient Instructions  The day of surgery (if you do NOT have diabetes):  Drink TWO (2) Pre-Surgery Clear Ensure the night before surgery  Drink ONE (1) Pre-Surgery Clear Ensure by _11_ am the morning of surgery This drink was given to you during your hospital  pre-op appointment visit. Nothing else to drink after completing the  Pre-Surgery Clear Ensure.          If you have questions, please contact your surgeon's office.    Remember:  Do not eat after midnight the night before your surgery  You may drink clear liquids until 11 the morning of your surgery.   Clear liquids allowed are: Water, Non-Citrus Juices (without pulp), Carbonated Beverages, Clear Tea, Black Coffee ONLY (NO MILK, CREAM OR POWDERED CREAMER of any kind), and Gatorade      Take these medicines the morning of surgery with A SIP OF WATER: buPROPion (WELLBUTRIN XL) 150 MG 24 hr tablet     IF NEEDED  acetaminophen (TYLENOL) 500 MG tablet  clonazePAM (KLONOPIN) 1 MG tablet  ondansetron (ZOFRAN-ODT) 4 MG disintegrating tablet  oxyCODONE-acetaminophen (PERCOCET/ROXICET) 5-325 MG tablet    Follow your surgeon's instructions on when to stop Xarelto and lovenox.  If no instructions were given by your surgeon then you will need to call the  office to get those instructions.      As of today, STOP taking any Aspirin (unless otherwise instructed by your surgeon) Aleve, Naproxen, Ibuprofen, Motrin, Advil, Goody's, BC's, all herbal medications, fish oil, and all vitamins.           Do not wear jewelry or makeup. Do not wear lotions, powders, perfumes or deodorant. Do not shave 48 hours prior to surgery.   Do not bring valuables to the hospital. Do not wear nail polish, gel polish, artificial nails, or any other type of covering on natural nails (fingers and toes) If you have artificial nails or gel coating that need to be removed by a nail salon, please have this removed prior to surgery. Artificial nails or gel coating may interfere with anesthesia's ability to adequately monitor your vital signs.  [facility] is not responsible for any belongings or valuables.    Do NOT Smoke (Tobacco/Vaping)  24 hours prior to your procedure  If you use a CPAP at night, you may bring your mask for your overnight stay.   Contacts, glasses, hearing aids, dentures or partials may not be worn into surgery, please bring cases for these belongings   For patients admitted to the hospital, discharge time will be determined by your treatment team.   Patients discharged the day of surgery will not be allowed to drive home, and someone needs to stay with them for 24 hours.   SURGICAL WAITING ROOM VISITATION Patients having surgery or  a procedure may have no more than 2 support people in the waiting area - these visitors may rotate.   Children under the age of 28 must have an adult with them who is not the patient. If the patient needs to stay at the hospital during part of their recovery, the visitor guidelines for inpatient rooms apply. Pre-op nurse will coordinate an appropriate time for 1 support person to accompany patient in pre-op.  This support person may not rotate.   Please refer to  [email or web address] for the visitor guidelines for Inpatients (after your surgery is over and you are in a regular room).    Special instructions:    Oral Hygiene is also important to reduce your risk of infection.  Remember - BRUSH YOUR TEETH THE MORNING OF SURGERY WITH YOUR REGULAR TOOTHPASTE   [facility]- Preparing For Surgery  Before surgery, you can play an important role. Because skin is not sterile, your skin needs to be as free of germs as possible. You can reduce the number of germs on your skin by washing with CHG (chlorahexidine gluconate) Soap before surgery.  CHG is an antiseptic cleaner which kills germs and bonds with the skin to continue killing germs even after washing.     Please do not use if you have an allergy to CHG or antibacterial soaps. If your skin becomes reddened/irritated stop using the CHG.  Do not shave (including legs and underarms) for at least 48 hours prior to first CHG shower. It is OK to shave your face.  Please follow these instructions carefully.     Shower the NIGHT BEFORE SURGERY and the MORNING OF SURGERY with CHG Soap.   If you chose to wash your hair, wash your hair first as usual with your normal shampoo. After you shampoo, rinse your hair and body thoroughly to remove the shampoo.  Then [employer] and genitals (private parts) with your normal soap and rinse thoroughly to remove soap.  After that Use CHG Soap as you would any other liquid soap. You can apply CHG directly to the skin and wash gently with a scrungie or a clean washcloth.   Apply the CHG Soap to your body ONLY FROM THE NECK DOWN.  Do not use on open wounds or open sores. Avoid contact with your eyes, ears, mouth and genitals (private parts). Wash Face and genitals (private parts)  with your normal soap.   Wash thoroughly, paying special attention to the area where your surgery will be performed.  Thoroughly rinse your body with  warm water from the neck down.  DO NOT shower/wash with your normal soap after using and rinsing off the CHG Soap.  Pat yourself dry with a CLEAN TOWEL.  Wear CLEAN PAJAMAS to bed the night before surgery  Place CLEAN SHEETS on your bed the night before your surgery  DO NOT SLEEP WITH PETS.   Day of Surgery:  Take a shower with CHG soap. Wear Clean/Comfortable clothing the morning of surgery Do not apply any deodorants/lotions.   Remember to brush your teeth WITH YOUR REGULAR TOOTHPASTE.    If you received a COVID test during your pre-op visit, it is requested that you wear a mask when out in public, stay away from anyone that may not be feeling well, and notify your surgeon if you develop symptoms. If you have been in contact with anyone that has tested positive in the last 10 days, please notify your surgeon.    Please read over  the following fact sheets that you were given.

## 2023-03-17 NOTE — Anesthesia Preprocedure Evaluation (Addendum)
Anesthesia Evaluation  Patient identified by MRN, date of birth, ID band Patient awake    Reviewed: Allergy & Precautions, NPO status , Patient's Chart, lab work & pertinent test results  History of Anesthesia Complications (+) PONV and history of anesthetic complications  Airway Mallampati: I       Dental no notable dental hx.    Pulmonary former smoker   Pulmonary exam normal        Cardiovascular + DVT  Normal cardiovascular exam     Neuro/Psych  Headaches PSYCHIATRIC DISORDERS Anxiety Depression       GI/Hepatic negative GI ROS, Neg liver ROS,,,  Endo/Other  negative endocrine ROS    Renal/GU Renal disease  negative genitourinary   Musculoskeletal negative musculoskeletal ROS (+)    Abdominal Normal abdominal exam  (+)   Peds  Hematology  (+) Blood dyscrasia (Lovenox)   Anesthesia Other Findings Pancreatic mass  Reproductive/Obstetrics S/p hysterectomy                             Anesthesia Physical Anesthesia Plan  ASA: 2  Anesthesia Plan: General   Post-op Pain Management:    Induction: Intravenous  PONV Risk Score and Plan: 4 or greater and Propofol infusion, Treatment may vary due to age or medical condition, Ondansetron, Dexamethasone and Midazolam  Airway Management Planned: Oral ETT  Additional Equipment: None  Intra-op Plan:   Post-operative Plan: Extubation in OR  Informed Consent: I have reviewed the patients History and Physical, chart, labs and discussed the procedure including the risks, benefits and alternatives for the proposed anesthesia with the patient or authorized representative who has indicated his/her understanding and acceptance.     Dental advisory given  Plan Discussed with: CRNA  Anesthesia Plan Comments: (PAT note written 03/17/2023 by Shonna Chock, PA-C.  )       Anesthesia Quick Evaluation

## 2023-03-25 ENCOUNTER — Other Ambulatory Visit: Payer: Self-pay

## 2023-03-25 ENCOUNTER — Inpatient Hospital Stay (HOSPITAL_COMMUNITY): Payer: BC Managed Care – PPO

## 2023-03-25 ENCOUNTER — Inpatient Hospital Stay (HOSPITAL_COMMUNITY)
Admission: RE | Admit: 2023-03-25 | Discharge: 2023-03-31 | DRG: 827 | Disposition: A | Discharge status: Home / Self Care | Payer: BC Managed Care – PPO | Attending: Surgery | Admitting: Surgery

## 2023-03-25 ENCOUNTER — Encounter (HOSPITAL_COMMUNITY)
Admission: RE | Disposition: A | Discharge status: Home / Self Care | Payer: Self-pay | Source: Home / Self Care | Attending: Surgery

## 2023-03-25 ENCOUNTER — Inpatient Hospital Stay (HOSPITAL_COMMUNITY): Payer: BC Managed Care – PPO | Admitting: Vascular Surgery

## 2023-03-25 ENCOUNTER — Encounter (HOSPITAL_COMMUNITY): Payer: Self-pay | Admitting: Surgery

## 2023-03-25 DIAGNOSIS — Z888 Allergy status to other drugs, medicaments and biological substances status: Secondary | ICD-10-CM | POA: Diagnosis not present

## 2023-03-25 DIAGNOSIS — Z8249 Family history of ischemic heart disease and other diseases of the circulatory system: Secondary | ICD-10-CM | POA: Diagnosis not present

## 2023-03-25 DIAGNOSIS — Z86718 Personal history of other venous thrombosis and embolism: Secondary | ICD-10-CM

## 2023-03-25 DIAGNOSIS — D62 Acute posthemorrhagic anemia: Secondary | ICD-10-CM | POA: Diagnosis not present

## 2023-03-25 DIAGNOSIS — F32A Depression, unspecified: Secondary | ICD-10-CM | POA: Diagnosis not present

## 2023-03-25 DIAGNOSIS — G43909 Migraine, unspecified, not intractable, without status migrainosus: Secondary | ICD-10-CM | POA: Diagnosis not present

## 2023-03-25 DIAGNOSIS — Z8701 Personal history of pneumonia (recurrent): Secondary | ICD-10-CM

## 2023-03-25 DIAGNOSIS — Z23 Encounter for immunization: Secondary | ICD-10-CM | POA: Diagnosis not present

## 2023-03-25 DIAGNOSIS — N179 Acute kidney failure, unspecified: Secondary | ICD-10-CM | POA: Diagnosis not present

## 2023-03-25 DIAGNOSIS — Z87891 Personal history of nicotine dependence: Secondary | ICD-10-CM | POA: Diagnosis not present

## 2023-03-25 DIAGNOSIS — Z91410 Personal history of adult physical and sexual abuse: Secondary | ICD-10-CM | POA: Diagnosis not present

## 2023-03-25 DIAGNOSIS — Z90710 Acquired absence of both cervix and uterus: Secondary | ICD-10-CM | POA: Diagnosis not present

## 2023-03-25 DIAGNOSIS — Z7901 Long term (current) use of anticoagulants: Secondary | ICD-10-CM | POA: Diagnosis not present

## 2023-03-25 DIAGNOSIS — C7A8 Other malignant neuroendocrine tumors: Secondary | ICD-10-CM | POA: Diagnosis not present

## 2023-03-25 DIAGNOSIS — D3A8 Other benign neuroendocrine tumors: Secondary | ICD-10-CM | POA: Diagnosis not present

## 2023-03-25 DIAGNOSIS — C7A098 Malignant carcinoid tumors of other sites: Secondary | ICD-10-CM | POA: Diagnosis not present

## 2023-03-25 DIAGNOSIS — Z885 Allergy status to narcotic agent status: Secondary | ICD-10-CM

## 2023-03-25 DIAGNOSIS — Z9882 Breast implant status: Secondary | ICD-10-CM

## 2023-03-25 DIAGNOSIS — Z79899 Other long term (current) drug therapy: Secondary | ICD-10-CM

## 2023-03-25 DIAGNOSIS — F419 Anxiety disorder, unspecified: Secondary | ICD-10-CM | POA: Diagnosis present

## 2023-03-25 DIAGNOSIS — Z833 Family history of diabetes mellitus: Secondary | ICD-10-CM

## 2023-03-25 DIAGNOSIS — K8689 Other specified diseases of pancreas: Principal | ICD-10-CM | POA: Diagnosis present

## 2023-03-25 HISTORY — PX: SPLENECTOMY, TOTAL: SHX788

## 2023-03-25 HISTORY — PX: PANCREATECTOMY: SHX5261

## 2023-03-25 LAB — GLUCOSE, CAPILLARY
Glucose-Capillary: 141 mg/dL — ABNORMAL HIGH (ref 70–99)
Glucose-Capillary: 172 mg/dL — ABNORMAL HIGH (ref 70–99)

## 2023-03-25 LAB — HEMOGLOBIN A1C
Hgb A1c MFr Bld: 5.3 % (ref 4.8–5.6)
Mean Plasma Glucose: 105.41 mg/dL

## 2023-03-25 SURGERY — PANCREATECTOMY, LAPAROSCOPIC
Anesthesia: General | Site: Abdomen

## 2023-03-25 MED ORDER — MELATONIN 3 MG PO TABS
3.0000 mg | ORAL_TABLET | Freq: Every evening | ORAL | Status: DC | PRN
  Administered 2023-03-25 – 2023-03-30 (×5): 3 mg via ORAL
  Filled 2023-03-25 (×5): qty 1

## 2023-03-25 MED ORDER — KETOROLAC TROMETHAMINE 30 MG/ML IJ SOLN
INTRAMUSCULAR | Status: DC | PRN
  Administered 2023-03-25: 15 mg via INTRAVENOUS

## 2023-03-25 MED ORDER — METRONIDAZOLE 500 MG/100ML IV SOLN
500.0000 mg | INTRAVENOUS | Status: AC
  Administered 2023-03-25: 500 mg via INTRAVENOUS

## 2023-03-25 MED ORDER — ONDANSETRON 4 MG PO TBDP
4.0000 mg | ORAL_TABLET | Freq: Four times a day (QID) | ORAL | Status: DC | PRN
  Administered 2023-03-29: 4 mg via ORAL
  Filled 2023-03-25: qty 1

## 2023-03-25 MED ORDER — ACETAMINOPHEN 500 MG PO TABS
1000.0000 mg | ORAL_TABLET | Freq: Four times a day (QID) | ORAL | Status: DC
  Administered 2023-03-25 – 2023-03-31 (×19): 1000 mg via ORAL
  Filled 2023-03-25 (×23): qty 2

## 2023-03-25 MED ORDER — PROMETHAZINE HCL 25 MG/ML IJ SOLN
INTRAMUSCULAR | Status: AC
  Filled 2023-03-25: qty 1

## 2023-03-25 MED ORDER — FENTANYL CITRATE (PF) 250 MCG/5ML IJ SOLN
INTRAMUSCULAR | Status: AC
  Filled 2023-03-25: qty 5

## 2023-03-25 MED ORDER — 0.9 % SODIUM CHLORIDE (POUR BTL) OPTIME
TOPICAL | Status: DC | PRN
  Administered 2023-03-25 (×2): 1000 mL

## 2023-03-25 MED ORDER — BUPROPION HCL ER (XL) 300 MG PO TB24
450.0000 mg | ORAL_TABLET | Freq: Every day | ORAL | Status: DC
  Administered 2023-03-26 – 2023-03-31 (×6): 450 mg via ORAL
  Filled 2023-03-25 (×6): qty 1

## 2023-03-25 MED ORDER — ENSURE PRE-SURGERY PO LIQD: 296.0000 mL | Freq: Once | ORAL | Status: DC

## 2023-03-25 MED ORDER — METOPROLOL TARTRATE 5 MG/5ML IV SOLN
INTRAVENOUS | Status: AC
  Filled 2023-03-25: qty 5

## 2023-03-25 MED ORDER — BUPIVACAINE-EPINEPHRINE (PF) 0.25% -1:200000 IJ SOLN
INTRAMUSCULAR | Status: DC | PRN
  Administered 2023-03-25: 6 mL
  Administered 2023-03-25: 24 mL via PERINEURAL

## 2023-03-25 MED ORDER — HYDROMORPHONE HCL 1 MG/ML IJ SOLN
INTRAMUSCULAR | Status: DC | PRN
Start: 2023-03-25 — End: 2023-03-25
  Administered 2023-03-25: .5 mg via INTRAVENOUS

## 2023-03-25 MED ORDER — ENOXAPARIN SODIUM 40 MG/0.4ML IJ SOSY
40.0000 mg | PREFILLED_SYRINGE | INTRAMUSCULAR | Status: DC
  Administered 2023-03-25: 40 mg via SUBCUTANEOUS
  Filled 2023-03-25: qty 0.4

## 2023-03-25 MED ORDER — DIPHENHYDRAMINE HCL 50 MG/ML IJ SOLN: 25.0000 mg | Freq: Four times a day (QID) | INTRAMUSCULAR | Status: DC | PRN

## 2023-03-25 MED ORDER — PROPOFOL 10 MG/ML IV BOLUS
INTRAVENOUS | Status: AC
  Filled 2023-03-25: qty 20

## 2023-03-25 MED ORDER — MEPERIDINE HCL 25 MG/ML IJ SOLN: 6.2500 mg | INTRAMUSCULAR | Status: DC | PRN

## 2023-03-25 MED ORDER — DIPHENHYDRAMINE HCL 25 MG PO CAPS: 25.0000 mg | ORAL_CAPSULE | Freq: Four times a day (QID) | ORAL | Status: DC | PRN

## 2023-03-25 MED ORDER — ROCURONIUM BROMIDE 10 MG/ML (PF) SYRINGE
PREFILLED_SYRINGE | INTRAVENOUS | Status: AC
  Filled 2023-03-25: qty 10

## 2023-03-25 MED ORDER — ORAL CARE MOUTH RINSE: 15.0000 mL | Freq: Once | OROMUCOSAL | Status: AC

## 2023-03-25 MED ORDER — DEXAMETHASONE SODIUM PHOSPHATE 10 MG/ML IJ SOLN
INTRAMUSCULAR | Status: DC | PRN
  Administered 2023-03-25: 10 mg via INTRAVENOUS

## 2023-03-25 MED ORDER — OXYCODONE HCL 5 MG PO TABS
5.0000 mg | ORAL_TABLET | ORAL | Status: DC | PRN
  Administered 2023-03-25: 5 mg via ORAL
  Administered 2023-03-26 – 2023-03-27 (×8): 10 mg via ORAL
  Filled 2023-03-25: qty 1
  Filled 2023-03-25 (×8): qty 2

## 2023-03-25 MED ORDER — MIDAZOLAM HCL 2 MG/2ML IJ SOLN
INTRAMUSCULAR | Status: AC
  Filled 2023-03-25: qty 2

## 2023-03-25 MED ORDER — HYDROMORPHONE HCL 1 MG/ML IJ SOLN
INTRAMUSCULAR | Status: AC
  Filled 2023-03-25: qty 1

## 2023-03-25 MED ORDER — HYDROMORPHONE HCL 1 MG/ML IJ SOLN
0.5000 mg | INTRAMUSCULAR | Status: DC | PRN
  Administered 2023-03-25 – 2023-03-26 (×3): 0.5 mg via INTRAVENOUS
  Filled 2023-03-25 (×3): qty 0.5

## 2023-03-25 MED ORDER — DOCUSATE SODIUM 100 MG PO CAPS
100.0000 mg | ORAL_CAPSULE | Freq: Two times a day (BID) | ORAL | Status: DC
  Administered 2023-03-26 – 2023-03-30 (×10): 100 mg via ORAL
  Filled 2023-03-25 (×11): qty 1

## 2023-03-25 MED ORDER — SODIUM CHLORIDE 0.9 % IR SOLN
Status: DC | PRN
  Administered 2023-03-25: 1000 mL

## 2023-03-25 MED ORDER — BUPIVACAINE LIPOSOME 1.3 % IJ SUSP
INTRAMUSCULAR | Status: AC
  Filled 2023-03-25: qty 20

## 2023-03-25 MED ORDER — LACTATED RINGERS IV SOLN: INTRAVENOUS | Status: DC

## 2023-03-25 MED ORDER — ONDANSETRON HCL 4 MG/2ML IJ SOLN
4.0000 mg | Freq: Four times a day (QID) | INTRAMUSCULAR | Status: DC | PRN
  Administered 2023-03-27 – 2023-03-31 (×4): 4 mg via INTRAVENOUS
  Filled 2023-03-25 (×4): qty 2

## 2023-03-25 MED ORDER — PROPOFOL 10 MG/ML IV BOLUS
INTRAVENOUS | Status: DC | PRN
  Administered 2023-03-25: 200 mg via INTRAVENOUS

## 2023-03-25 MED ORDER — METHOCARBAMOL 500 MG PO TABS
500.0000 mg | ORAL_TABLET | Freq: Four times a day (QID) | ORAL | Status: DC
  Administered 2023-03-25: 500 mg via ORAL
  Filled 2023-03-25: qty 1

## 2023-03-25 MED ORDER — ONDANSETRON HCL 4 MG/2ML IJ SOLN
INTRAMUSCULAR | Status: AC
  Filled 2023-03-25: qty 2

## 2023-03-25 MED ORDER — SUGAMMADEX SODIUM 200 MG/2ML IV SOLN
INTRAVENOUS | Status: DC | PRN
  Administered 2023-03-25: 160 mg via INTRAVENOUS

## 2023-03-25 MED ORDER — KETOROLAC TROMETHAMINE 30 MG/ML IJ SOLN
INTRAMUSCULAR | Status: AC
  Filled 2023-03-25: qty 1

## 2023-03-25 MED ORDER — BUPIVACAINE-EPINEPHRINE (PF) 0.25% -1:200000 IJ SOLN
INTRAMUSCULAR | Status: AC
  Filled 2023-03-25: qty 30

## 2023-03-25 MED ORDER — KETOROLAC TROMETHAMINE 30 MG/ML IJ SOLN
30.0000 mg | Freq: Once | INTRAMUSCULAR | Status: AC | PRN
  Administered 2023-03-25: 30 mg via INTRAVENOUS

## 2023-03-25 MED ORDER — METOPROLOL TARTRATE 5 MG/5ML IV SOLN
INTRAVENOUS | Status: DC | PRN
  Administered 2023-03-25: 1 mg via INTRAVENOUS
  Administered 2023-03-25: 2 mg via INTRAVENOUS

## 2023-03-25 MED ORDER — ONDANSETRON HCL 4 MG/2ML IJ SOLN
INTRAMUSCULAR | Status: DC | PRN
  Administered 2023-03-25: 4 mg via INTRAVENOUS

## 2023-03-25 MED ORDER — CLONAZEPAM 0.5 MG PO TABS
0.5000 mg | ORAL_TABLET | Freq: Two times a day (BID) | ORAL | Status: DC | PRN
  Administered 2023-03-25 – 2023-03-28 (×3): 0.5 mg via ORAL
  Filled 2023-03-25 (×3): qty 1

## 2023-03-25 MED ORDER — INSULIN ASPART 100 UNIT/ML IJ SOLN
0.0000 [IU] | INTRAMUSCULAR | Status: DC
  Administered 2023-03-25: 2 [IU] via SUBCUTANEOUS
  Administered 2023-03-26 – 2023-03-29 (×8): 1 [IU] via SUBCUTANEOUS

## 2023-03-25 MED ORDER — DEXAMETHASONE SODIUM PHOSPHATE 10 MG/ML IJ SOLN
INTRAMUSCULAR | Status: AC
  Filled 2023-03-25: qty 1

## 2023-03-25 MED ORDER — PHENYLEPHRINE 80 MCG/ML (10ML) SYRINGE FOR IV PUSH (FOR BLOOD PRESSURE SUPPORT)
PREFILLED_SYRINGE | INTRAVENOUS | Status: DC | PRN
  Administered 2023-03-25 (×2): 80 ug via INTRAVENOUS

## 2023-03-25 MED ORDER — CEFAZOLIN SODIUM-DEXTROSE 2-4 GM/100ML-% IV SOLN
INTRAVENOUS | Status: AC
  Filled 2023-03-25: qty 100

## 2023-03-25 MED ORDER — ROCURONIUM BROMIDE 10 MG/ML (PF) SYRINGE
PREFILLED_SYRINGE | INTRAVENOUS | Status: DC | PRN
  Administered 2023-03-25: 20 mg via INTRAVENOUS
  Administered 2023-03-25 (×2): 50 mg via INTRAVENOUS
  Administered 2023-03-25: 20 mg via INTRAVENOUS

## 2023-03-25 MED ORDER — CHLORHEXIDINE GLUCONATE 0.12 % MT SOLN
OROMUCOSAL | Status: AC
  Administered 2023-03-25: 15 mL via OROMUCOSAL
  Filled 2023-03-25: qty 15

## 2023-03-25 MED ORDER — LIDOCAINE 2% (20 MG/ML) 5 ML SYRINGE
INTRAMUSCULAR | Status: AC
  Filled 2023-03-25: qty 5

## 2023-03-25 MED ORDER — METRONIDAZOLE 500 MG/100ML IV SOLN
INTRAVENOUS | Status: AC
  Filled 2023-03-25: qty 100

## 2023-03-25 MED ORDER — PROMETHAZINE HCL 25 MG/ML IJ SOLN
6.2500 mg | INTRAMUSCULAR | Status: AC | PRN
  Administered 2023-03-25 (×2): 6.25 mg via INTRAVENOUS

## 2023-03-25 MED ORDER — HYDROMORPHONE HCL 1 MG/ML IJ SOLN
0.2500 mg | INTRAMUSCULAR | Status: DC | PRN
  Administered 2023-03-25: 0.5 mg via INTRAVENOUS

## 2023-03-25 MED ORDER — LISDEXAMFETAMINE DIMESYLATE 70 MG PO CAPS
70.0000 mg | ORAL_CAPSULE | Freq: Every day | ORAL | Status: DC
  Filled 2023-03-25 (×4): qty 1

## 2023-03-25 MED ORDER — CEFAZOLIN SODIUM-DEXTROSE 2-4 GM/100ML-% IV SOLN
2.0000 g | INTRAVENOUS | Status: AC
  Administered 2023-03-25: 2 g via INTRAVENOUS

## 2023-03-25 MED ORDER — MIDAZOLAM HCL 2 MG/2ML IJ SOLN
INTRAMUSCULAR | Status: DC | PRN
  Administered 2023-03-25: 2 mg via INTRAVENOUS

## 2023-03-25 MED ORDER — LIDOCAINE 2% (20 MG/ML) 5 ML SYRINGE
INTRAMUSCULAR | Status: DC | PRN
  Administered 2023-03-25: 100 mg via INTRAVENOUS

## 2023-03-25 MED ORDER — ENSURE PRE-SURGERY PO LIQD: 592.0000 mL | Freq: Once | ORAL | Status: DC

## 2023-03-25 MED ORDER — HYDROMORPHONE HCL 1 MG/ML IJ SOLN
INTRAMUSCULAR | Status: AC
  Filled 2023-03-25: qty 0.5

## 2023-03-25 MED ORDER — CHLORHEXIDINE GLUCONATE 0.12 % MT SOLN: 15.0000 mL | Freq: Once | OROMUCOSAL | Status: AC

## 2023-03-25 MED ORDER — FENTANYL CITRATE (PF) 250 MCG/5ML IJ SOLN
INTRAMUSCULAR | Status: DC | PRN
  Administered 2023-03-25: 100 ug via INTRAVENOUS
  Administered 2023-03-25: 150 ug via INTRAVENOUS

## 2023-03-25 SURGICAL SUPPLY — 104 items
ADH SKN CLS APL DERMABOND .7 (GAUZE/BANDAGES/DRESSINGS) ×1
APL PRP STRL LF DISP 70% ISPRP (MISCELLANEOUS) ×1
APL SRG 38 LTWT LNG FL B (MISCELLANEOUS)
APL SWBSTK 6 STRL LF DISP (MISCELLANEOUS) ×2
APPLICATOR ARISTA FLEXITIP XL (MISCELLANEOUS) IMPLANT
APPLICATOR COTTON TIP 6 STRL (MISCELLANEOUS) IMPLANT
APPLICATOR COTTON TIP 6IN STRL (MISCELLANEOUS) ×2
APPLIER CLIP 5 13 M/L LIGAMAX5 (MISCELLANEOUS) ×1
APR CLP MED LRG 5 ANG JAW (MISCELLANEOUS) ×1
BAG BILE T-TUBES STRL (MISCELLANEOUS) IMPLANT
BAG DRN 9.5 2 ADJ BELT ADPR (MISCELLANEOUS)
BLADE CLIPPER SURG (BLADE) IMPLANT
BLADE SURG 10 STRL SS (BLADE) ×1 IMPLANT
CANISTER SUCT 3000ML PPV (MISCELLANEOUS) ×1 IMPLANT
CHLORAPREP W/TINT 26 (MISCELLANEOUS) ×1 IMPLANT
CLIP APPLIE 5 13 M/L LIGAMAX5 (MISCELLANEOUS) IMPLANT
CLIP LIGATING HEMO LOK XL GOLD (MISCELLANEOUS) IMPLANT
CLIP LIGATING HEMO O LOK GREEN (MISCELLANEOUS) IMPLANT
CLIP TI MEDIUM 24 (CLIP) IMPLANT
CLIP TI WIDE RED SMALL 24 (CLIP) IMPLANT
COVER SURGICAL LIGHT HANDLE (MISCELLANEOUS) ×1 IMPLANT
DEFOGGER SCOPE WARMER CLEARIFY (MISCELLANEOUS) IMPLANT
DERMABOND ADVANCED .7 DNX12 (GAUZE/BANDAGES/DRESSINGS) IMPLANT
DRAIN CHANNEL 19F RND (DRAIN) IMPLANT
DRAIN RELI 100 BL SUC LF ST (DRAIN) ×1
DRAPE WARM FLUID 44X44 (DRAPES) ×1 IMPLANT
DRSG TEGADERM 4X4.75 (GAUZE/BANDAGES/DRESSINGS) IMPLANT
ELECT BLADE 6.5 EXT (BLADE) IMPLANT
ELECT CAUTERY BLADE 6.4 (BLADE) ×1 IMPLANT
ELECT REM PT RETURN 9FT ADLT (ELECTROSURGICAL) ×1
ELECTRODE REM PT RTRN 9FT ADLT (ELECTROSURGICAL) ×1 IMPLANT
EVACUATOR SILICONE 100CC (DRAIN) IMPLANT
GAUZE SPONGE 2X2 STRL 8-PLY (GAUZE/BANDAGES/DRESSINGS) IMPLANT
GEL PDS (MISCELLANEOUS) IMPLANT
GLOVE BIO SURGEON STRL SZ 6 (GLOVE) ×1 IMPLANT
GLOVE INDICATOR 6.5 STRL GRN (GLOVE) ×1 IMPLANT
GLOVE SURG POLY MICRO LF SZ5.5 (GLOVE) ×1 IMPLANT
GLOVE SURG UNDER POLY LF SZ6 (GLOVE) ×1 IMPLANT
GOWN STRL REUS W/ TWL LRG LVL3 (GOWN DISPOSABLE) ×2 IMPLANT
GOWN STRL REUS W/TWL LRG LVL3 (GOWN DISPOSABLE) ×2
HEMOSTAT ARISTA ABSORB 3G PWDR (HEMOSTASIS) IMPLANT
IRRIG SUCT STRYKERFLOW 2 WTIP (MISCELLANEOUS) ×1
IRRIGATION SUCT STRKRFLW 2 WTP (MISCELLANEOUS) ×1 IMPLANT
KIT BASIN OR (CUSTOM PROCEDURE TRAY) ×1 IMPLANT
KIT TURNOVER KIT B (KITS) ×1 IMPLANT
L-HOOK LAP DISP 36CM (ELECTROSURGICAL) ×1
LHOOK LAP DISP 36CM (ELECTROSURGICAL) IMPLANT
NDL 22X1.5 STRL (OR ONLY) (MISCELLANEOUS) ×1 IMPLANT
NDL INSUFFLATION 14GA 120MM (NEEDLE) ×1 IMPLANT
NEEDLE 22X1.5 STRL (OR ONLY) (MISCELLANEOUS) ×1
NEEDLE INSUFFLATION 14GA 120MM (NEEDLE) ×1
NS IRRIG 1000ML POUR BTL (IV SOLUTION) ×1 IMPLANT
PAD ARMBOARD 7.5X6 YLW CONV (MISCELLANEOUS) ×2 IMPLANT
PENCIL SMOKE EVACUATOR (MISCELLANEOUS) ×1 IMPLANT
POUCH LAPAROSCOPIC INSTRUMENT (MISCELLANEOUS) ×1 IMPLANT
RELOAD STAPLE 60 2.6 WHT THN (STAPLE) IMPLANT
RELOAD STAPLE 60 4.1 GRN THCK (STAPLE) IMPLANT
SCISSORS LAP 5X35 DISP (ENDOMECHANICALS) IMPLANT
SET TUBE SMOKE EVAC HIGH FLOW (TUBING) ×1 IMPLANT
SHEARS HARMONIC ACE PLUS 36CM (ENDOMECHANICALS) ×1 IMPLANT
SLEEVE Z-THREAD 5X100MM (TROCAR) ×3 IMPLANT
STAPLE ECHEON FLEX 60 POW ENDO (STAPLE) IMPLANT
STAPLE LINE REINFORCEMENT LAP (STAPLE) IMPLANT
STAPLER POWER ECHELON 60 WIDE (STAPLE) IMPLANT
STAPLER RELOAD GREEN 60MM (STAPLE) ×2
STAPLER RELOAD WHITE 60MM (STAPLE) ×3
STAPLER VISISTAT 35W (STAPLE) IMPLANT
SUT ETHILON 2 0 FS 18 (SUTURE) IMPLANT
SUT MNCRL AB 4-0 PS2 18 (SUTURE) ×1 IMPLANT
SUT NOVA NAB DX-16 0-1 5-0 T12 (SUTURE) IMPLANT
SUT PDS AB 1 CTX 36 (SUTURE) IMPLANT
SUT PDS AB 1 TP1 96 (SUTURE) IMPLANT
SUT PDS AB 3-0 SH 27 (SUTURE) IMPLANT
SUT PDS II 0 TP-1 LOOPED 60 (SUTURE) IMPLANT
SUT PROLENE 3 0 SH 48 (SUTURE) IMPLANT
SUT PROLENE 4 0 RB 1 (SUTURE)
SUT PROLENE 4 0 SH DA (SUTURE) IMPLANT
SUT PROLENE 4-0 RB1 .5 CRCL 36 (SUTURE) IMPLANT
SUT PROLENE 5 0 C1 (SUTURE) IMPLANT
SUT SILK 2 0 SH (SUTURE) IMPLANT
SUT SILK 2 0 TIES 10X30 (SUTURE) IMPLANT
SUT SILK 2 0SH CR/8 30 (SUTURE) IMPLANT
SUT SILK 3 0 SH CR/8 (SUTURE) IMPLANT
SUT SILK 3 0 TIES 10X30 (SUTURE) IMPLANT
SUT SILK 3 0SH CR/8 30 (SUTURE) IMPLANT
SUT VIC AB 3-0 SH 27 (SUTURE) ×1
SUT VIC AB 3-0 SH 27X BRD (SUTURE) ×1 IMPLANT
SUT VICRYL 0 AB UR-6 (SUTURE) IMPLANT
SYS BAG RETRIEVAL 10MM (BASKET) ×1
SYS LAPSCP GELPORT 120MM (MISCELLANEOUS)
SYS WOUND ALEXIS 18CM MED (MISCELLANEOUS) ×1
SYSTEM BAG RETRIEVAL 10MM (BASKET) IMPLANT
SYSTEM LAPSCP GELPORT 120MM (MISCELLANEOUS) ×1 IMPLANT
SYSTEM WOUND ALEXIS 18CM MED (MISCELLANEOUS) IMPLANT
TOWEL GREEN STERILE (TOWEL DISPOSABLE) ×1 IMPLANT
TOWEL GREEN STERILE FF (TOWEL DISPOSABLE) ×1 IMPLANT
TRAY FOLEY MTR SLVR 14FR STAT (SET/KITS/TRAYS/PACK) ×1 IMPLANT
TRAY LAPAROSCOPIC MC (CUSTOM PROCEDURE TRAY) ×1 IMPLANT
TROCAR BALLN 12MMX100 BLUNT (TROCAR) IMPLANT
TROCAR Z THREAD OPTICAL 12X100 (TROCAR) ×1 IMPLANT
TROCAR Z-THREAD OPTICAL 5X100M (TROCAR) ×1 IMPLANT
TUBE CONNECTING 12X1/4 (SUCTIONS) IMPLANT
WARMER LAPAROSCOPE (MISCELLANEOUS) ×1 IMPLANT
YANKAUER SUCT BULB TIP NO VENT (SUCTIONS) IMPLANT

## 2023-03-25 NOTE — Anesthesia Procedure Notes (Signed)
Procedure Name: Intubation Date/Time: 03/25/2023 12:56 PM  Performed by: Georgianne Fick D, CRNAPre-anesthesia Checklist: Patient identified, Emergency Drugs available, Suction available and Patient being monitored Patient Re-evaluated:Patient Re-evaluated prior to induction Oxygen Delivery Method: Circle System Utilized Preoxygenation: Pre-oxygenation with 100% oxygen Induction Type: IV induction Ventilation: Mask ventilation without difficulty Laryngoscope Size: Miller and 3 Grade View: Grade I Tube type: Oral Tube size: 7.0 mm Number of attempts: 1 Airway Equipment and Method: Stylet and Oral airway Placement Confirmation: ETT inserted through vocal cords under direct vision, positive ETCO2 and breath sounds checked- equal and bilateral Secured at: 21 cm Tube secured with: Tape Dental Injury: Teeth and Oropharynx as per pre-operative assessment

## 2023-03-25 NOTE — Transfer of Care (Signed)
Immediate Anesthesia Transfer of Care Note  Patient: Alexis Lindsey  Procedure(s) Performed: LAPAROSCOPIC DISTAL PANCREATECTOMY WITH INTRAOPERATIVE ULTRASOUND (Abdomen)  Patient Location: PACU  Anesthesia Type:General  Level of Consciousness: drowsy, patient cooperative, and responds to stimulation  Airway & Oxygen Therapy: Patient Spontanous Breathing and Patient connected to nasal cannula oxygen  Post-op Assessment: Report given to RN and Post -op Vital signs reviewed and stable  Post vital signs: Reviewed and stable  Last Vitals:  Vitals Value Taken Time  BP 131/87 03/25/23 1645  Temp    Pulse 85 03/25/23 1646  Resp 19 03/25/23 1646  SpO2 99 % 03/25/23 1646  Vitals shown include unfiled device data.  Last Pain:  Vitals:   03/25/23 1124  TempSrc:   PainSc: 1          Complications: No notable events documented.

## 2023-03-25 NOTE — Op Note (Incomplete)
Date: 03/25/23  Patient: Alexis Lindsey MRN: [id number]  Preoperative Diagnosis: Pancreatic neuroendocrine tumor Postoperative Diagnosis: Same  Procedure: Laparoscopic distal pancreatectomy with splenectomy  Surgeon: Sophronia Simas, MD Assistant: Almond Lint, MD  EBL: 500 mL  Anesthesia: General endotracheal  Specimens:  Distal pancreas Spleen  Indications: ***  Findings: ***  Procedure details: Informed consent was obtained in the preoperative area prior to the procedure. The patient was brought to the operating room and placed on the table in the supine position. General anesthesia was induced and appropriate lines and drains were placed for intraoperative monitoring. Perioperative antibiotics were administered per SCIP guidelines. The abdomen was prepped and draped in the usual sterile fashion. A pre-procedure timeout was taken verifying patient identity, surgical site and procedure to be performed.  ***  The patient tolerated the procedure well with no apparent complications.  All counts were correct x2 at the end of the procedure. The patient was extubated and taken to PACU in stable condition.  Sophronia Simas, MD 03/25/23 5:12 PM
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
## 2023-03-26 ENCOUNTER — Encounter (HOSPITAL_COMMUNITY): Payer: Self-pay | Admitting: Surgery

## 2023-03-26 LAB — GLUCOSE, CAPILLARY
Glucose-Capillary: 148 mg/dL — ABNORMAL HIGH (ref 70–99)
Glucose-Capillary: 127 mg/dL — ABNORMAL HIGH (ref 70–99)
Glucose-Capillary: 116 mg/dL — ABNORMAL HIGH (ref 70–99)
Glucose-Capillary: 126 mg/dL — ABNORMAL HIGH (ref 70–99)
Glucose-Capillary: 128 mg/dL — ABNORMAL HIGH (ref 70–99)

## 2023-03-26 LAB — CBC
HCT: 34.4 % — ABNORMAL LOW (ref 36.0–46.0)
Hemoglobin: 11.3 g/dL — ABNORMAL LOW (ref 12.0–15.0)
MCH: 29.3 pg (ref 26.0–34.0)
MCHC: 32.8 g/dL (ref 30.0–36.0)
MCV: 89.1 fL (ref 80.0–100.0)
Platelets: 260 10*3/uL (ref 150–400)
RBC: 3.86 MIL/uL — ABNORMAL LOW (ref 3.87–5.11)
RDW: 12.5 % (ref 11.5–15.5)
WBC: 21.8 10*3/uL — ABNORMAL HIGH (ref 4.0–10.5)
nRBC: 0 % (ref 0.0–0.2)

## 2023-03-26 LAB — BASIC METABOLIC PANEL
Anion gap: 9 (ref 5–15)
BUN: 10 mg/dL (ref 6–20)
CO2: 23 mmol/L (ref 22–32)
Calcium: 8.2 mg/dL — ABNORMAL LOW (ref 8.9–10.3)
Chloride: 101 mmol/L (ref 98–111)
Creatinine, Ser: 1.05 mg/dL — ABNORMAL HIGH (ref 0.44–1.00)
GFR, Estimated: >60 mL/min (ref >60)
Glucose, Bld: 156 mg/dL — ABNORMAL HIGH (ref 70–99)
Potassium: 4.5 mmol/L (ref 3.5–5.1)
Sodium: 133 mmol/L — ABNORMAL LOW (ref 135–145)

## 2023-03-26 MED ORDER — HYDROMORPHONE HCL 1 MG/ML IJ SOLN
0.5000 mg | INTRAMUSCULAR | Status: DC | PRN
  Administered 2023-03-26 – 2023-03-28 (×8): 0.5 mg via INTRAVENOUS
  Filled 2023-03-26 (×8): qty 0.5

## 2023-03-26 MED ORDER — ENOXAPARIN SODIUM 80 MG/0.8ML IJ SOSY
80.0000 mg | PREFILLED_SYRINGE | Freq: Two times a day (BID) | INTRAMUSCULAR | Status: DC
  Administered 2023-03-26 – 2023-03-31 (×8): 80 mg via SUBCUTANEOUS
  Filled 2023-03-26 (×11): qty 0.8

## 2023-03-26 MED ORDER — METHOCARBAMOL 1000 MG/10ML IJ SOLN
1000.0000 mg | Freq: Four times a day (QID) | INTRAVENOUS | Status: DC
  Administered 2023-03-26 – 2023-03-27 (×5): 1000 mg via INTRAVENOUS
  Filled 2023-03-26 (×3): qty 10
  Filled 2023-03-26 (×2): qty 1000
  Filled 2023-03-26: qty 10
  Filled 2023-03-26: qty 1000

## 2023-03-26 MED ORDER — KETOROLAC TROMETHAMINE 15 MG/ML IJ SOLN
15.0000 mg | Freq: Three times a day (TID) | INTRAMUSCULAR | Status: DC
  Administered 2023-03-26 – 2023-03-28 (×6): 15 mg via INTRAVENOUS
  Filled 2023-03-26 (×7): qty 1

## 2023-03-26 NOTE — Plan of Care (Signed)
  Problem: Education: Goal: Knowledge of General Education information will improve Description: Including pain rating scale, medication(s)/side effects and non-pharmacologic comfort measures Outcome: Progressing   Problem: Health Behavior/Discharge Planning: Goal: Ability to manage health-related needs will improve Outcome: Progressing   Problem: Clinical Measurements: Goal: Ability to maintain clinical measurements within normal limits will improve Outcome: Progressing Goal: Will remain free from infection Outcome: Progressing Goal: Diagnostic test results will improve Outcome: Progressing Goal: Respiratory complications will improve Outcome: Progressing Goal: Cardiovascular complication will be avoided Outcome: Progressing   Problem: Activity: Goal: Risk for activity intolerance will decrease Outcome: Progressing   Problem: Nutrition: Goal: Adequate nutrition will be maintained Outcome: Progressing   Problem: Pain Managment: Goal: General experience of comfort will improve Outcome: Progressing   Problem: Skin Integrity: Goal: Risk for impaired skin integrity will decrease Outcome: Progressing   Problem: Education: Goal: Ability to describe self-care measures that may prevent or decrease complications (Diabetes Survival Skills Education) will improve Outcome: Progressing Goal: Individualized Educational Video(s) Outcome: Progressing

## 2023-03-26 NOTE — Progress Notes (Signed)
Foley removed at 1530... pt education given... pt verb understanding.

## 2023-03-26 NOTE — Progress Notes (Signed)
    1 Day Post-Op  Subjective: Patient reports significant pain this morning, does not feel current regimen is adequate. Some nausea but no vomiting, tolerating some clears. Hemodynamically stable.   Objective: Vital signs in last 24 hours: Temp:  [97.7 F (36.5 C)-98.6 F (37 C)] 98.1 F (36.7 C) (08/29 0712) Pulse Rate:  [76-97] 94 (08/29 0712) Resp:  [11-20] 17 (08/29 0312) BP: (116-137)/(64-92) 124/81 (08/29 0712) SpO2:  [98 %-100 %] 99 % (08/29 0712) Weight:  [78 kg] 78 kg (08/28 1110) Last BM Date :  (pta)  Intake/Output from previous day: 08/28 0701 - 08/29 0700 In: 1050 [I.V.:1050] Out: 1350 [Urine:820; Drains:30; Blood:500] Intake/Output this shift: No intake/output data recorded.  PE: General: resting comfortably, NAD Neuro: alert and oriented, no focal deficits Resp: normal work of breathing CV: RRR Abdomen: soft, nondistended, appropriately tender. Incisions clean and dry. LUQ JP with serosanguinous drainage. Extremities: warm and well-perfused GU: foley draining clear yellow urine   Lab Results:  Recent Labs    03/26/23 0322  WBC 21.8*  HGB 11.3*  HCT 34.4*  PLT 260   BMET Recent Labs    03/26/23 0322  NA 133*  K 4.5  CL 101  CO2 23  GLUCOSE 156*  BUN 10  CREATININE 1.05*  CALCIUM 8.2*   PT/INR No results for input(s): "LABPROT", "INR" in the last 72 hours. CMP     Component Value Date/Time   NA 133 (L) 03/26/2023 0322   K 4.5 03/26/2023 0322   CL 101 03/26/2023 0322   CO2 23 03/26/2023 0322   GLUCOSE 156 (H) 03/26/2023 0322   BUN 10 03/26/2023 0322   CREATININE 1.05 (H) 03/26/2023 0322   CREATININE 1.04 (H) 01/22/2023 1301   CALCIUM 8.2 (L) 03/26/2023 0322   PROT 7.4 01/22/2023 1301   ALBUMIN 4.1 01/22/2023 1301   AST 16 01/22/2023 1301   ALT 27 01/22/2023 1301   ALKPHOS 58 01/22/2023 1301   BILITOT 0.7 01/22/2023 1301   GFRNONAA >60 03/26/2023 0322   GFRNONAA >60 01/22/2023 1301   GFRAA >60 11/16/2018 0530   Lipase      Component Value Date/Time   LIPASE 25 07/18/2018 0210     Assessment/Plan 42 yo female with neuroendocrine tumor of the tail of the pancreas, POD1 s/p laparoscopic distal pancreatectomy with splenectomy, and colorrhaphy. - Clear liquid diet, IVF at 75 ml/hr - Pain control: continue prn oxycodone, increase frequency of prn dilaudid. Add scheduled toradol and IV robaxin, continue scheduled tylenol. - Ambulate once pain improved. PT eval ordered. - Remove foley - POD1 drain amylase pending. Repeat on POD3. - Will give post-splenectomy vaccines prior to discharge. - VTE: SCDs, prophylactic lovenox started last night. Will switch to therapeutic lovenox this evening at 24 hours postop given recent history of unprovoked DVT. - Dispo: progressive care     LOS: 1 day    Sophronia Simas, MD [facility] Surgery General, Hepatobiliary and Pancreatic Surgery 03/26/23 8:32 AM

## 2023-03-27 LAB — BASIC METABOLIC PANEL
Anion gap: 7 (ref 5–15)
BUN: 9 mg/dL (ref 6–20)
CO2: 26 mmol/L (ref 22–32)
Calcium: 8 mg/dL — ABNORMAL LOW (ref 8.9–10.3)
Chloride: 101 mmol/L (ref 98–111)
Creatinine, Ser: 0.95 mg/dL (ref 0.44–1.00)
GFR, Estimated: >60 mL/min (ref >60)
Glucose, Bld: 115 mg/dL — ABNORMAL HIGH (ref 70–99)
Potassium: 3.9 mmol/L (ref 3.5–5.1)
Sodium: 134 mmol/L — ABNORMAL LOW (ref 135–145)

## 2023-03-27 LAB — CBC
HCT: 29.1 % — ABNORMAL LOW (ref 36.0–46.0)
Hemoglobin: 9.6 g/dL — ABNORMAL LOW (ref 12.0–15.0)
MCH: 29.7 pg (ref 26.0–34.0)
MCHC: 33 g/dL (ref 30.0–36.0)
MCV: 90.1 fL (ref 80.0–100.0)
Platelets: 241 10*3/uL (ref 150–400)
RBC: 3.23 MIL/uL — ABNORMAL LOW (ref 3.87–5.11)
RDW: 12.9 % (ref 11.5–15.5)
WBC: 25.5 10*3/uL — ABNORMAL HIGH (ref 4.0–10.5)
nRBC: 0 % (ref 0.0–0.2)

## 2023-03-27 LAB — GLUCOSE, CAPILLARY
Glucose-Capillary: 100 mg/dL — ABNORMAL HIGH (ref 70–99)
Glucose-Capillary: 120 mg/dL — ABNORMAL HIGH (ref 70–99)
Glucose-Capillary: 120 mg/dL — ABNORMAL HIGH (ref 70–99)
Glucose-Capillary: 124 mg/dL — ABNORMAL HIGH (ref 70–99)
Glucose-Capillary: 128 mg/dL — ABNORMAL HIGH (ref 70–99)
Glucose-Capillary: 133 mg/dL — ABNORMAL HIGH (ref 70–99)
Glucose-Capillary: 89 mg/dL (ref 70–99)

## 2023-03-27 LAB — HEMOGLOBIN AND HEMATOCRIT, BLOOD
HCT: 30 % — ABNORMAL LOW (ref 36.0–46.0)
Hemoglobin: 9.9 g/dL — ABNORMAL LOW (ref 12.0–15.0)

## 2023-03-27 MED ORDER — METOCLOPRAMIDE HCL 5 MG/ML IJ SOLN
INTRAMUSCULAR | Status: AC
  Administered 2023-03-27: 10 mg via INTRAVENOUS
  Filled 2023-03-27: qty 2

## 2023-03-27 MED ORDER — METOCLOPRAMIDE HCL 5 MG/ML IJ SOLN
10.0000 mg | Freq: Four times a day (QID) | INTRAMUSCULAR | Status: DC
  Administered 2023-03-29 – 2023-03-31 (×8): 10 mg via INTRAVENOUS
  Filled 2023-03-27 (×9): qty 2

## 2023-03-27 MED ORDER — PROCHLORPERAZINE EDISYLATE 10 MG/2ML IJ SOLN
INTRAMUSCULAR | Status: AC
  Administered 2023-03-27: 10 mg via INTRAVENOUS
  Filled 2023-03-27: qty 2

## 2023-03-27 MED ORDER — METHOCARBAMOL 500 MG PO TABS
1000.0000 mg | ORAL_TABLET | Freq: Four times a day (QID) | ORAL | Status: DC
  Administered 2023-03-27 – 2023-03-30 (×10): 1000 mg via ORAL
  Filled 2023-03-27 (×14): qty 2

## 2023-03-27 MED ORDER — PROCHLORPERAZINE EDISYLATE 10 MG/2ML IJ SOLN
10.0000 mg | INTRAMUSCULAR | Status: DC | PRN
  Administered 2023-03-30 – 2023-03-31 (×2): 10 mg via INTRAVENOUS
  Filled 2023-03-27 (×2): qty 2

## 2023-03-27 NOTE — TOC CM/SW Note (Signed)
Transition of Care [facility]) - Inpatient Brief Assessment   Patient Details  Name: Alexis Lindsey MRN: [id number] Date of Birth: 08-25-1980  Transition of [facility]) CM/SW Contact:    Darrold Span, RN Phone Number: 03/27/2023, 2:06 PM   Clinical Narrative: Anticipate return home- We will continue to monitor patient advancement through interdisciplinary progression rounds. If new patient transition needs arise, please place a TOC consult.   Transition of Care Asessment: Insurance and Status: Insurance coverage has been reviewed Patient has primary care physician: Yes Home environment has been reviewed: home w/ spouse Prior level of function:: self/independent Prior/Current Home Services: No current home services Social Determinants of Health Reivew: SDOH reviewed no interventions necessary Readmission risk has been reviewed: Yes Transition of care needs: no transition of care needs at this time

## 2023-03-27 NOTE — Plan of Care (Signed)

## 2023-03-27 NOTE — Progress Notes (Signed)
    2 Days Post-Op  Subjective: Pain control much better. Tolerating liquids, feels hungry. No vomiting. Hgb 9 this morning from 11, HR 100 at time of my exam. Patient has been ambulating to the bathroom, reports some dizziness when standing.   Objective: Vital signs in last 24 hours: Temp:  [97.7 F (36.5 C)-98.2 F (36.8 C)] 98.2 F (36.8 C) (08/30 0808) Pulse Rate:  [76-105] 85 (08/30 0808) Resp:  [12-24] 12 (08/30 0808) BP: (92-110)/(53-66) 92/53 (08/30 0808) SpO2:  [97 %-100 %] 99 % (08/30 0808) Last BM Date :  (pta)  Intake/Output from previous day: 08/29 0701 - 08/30 0700 In: 1698 [I.V.:1477.8; IV Piggyback:220.1] Out: 40 [Drains:40] Intake/Output this shift: No intake/output data recorded.  PE: General: resting comfortably, NAD Neuro: alert and oriented, no focal deficits Resp: normal work of breathing CV: RRR Abdomen: soft, nondistended, appropriately tender. Incisions clean and dry. LUQ JP with serosanguinous drainage. Extremities: warm and well-perfused   Lab Results:  Recent Labs    03/26/23 0322 03/27/23 0454  WBC 21.8* 25.5*  HGB 11.3* 9.6*  HCT 34.4* 29.1*  PLT 260 241   BMET Recent Labs    03/26/23 0322 03/27/23 0454  NA 133* 134*  K 4.5 3.9  CL 101 101  CO2 23 26  GLUCOSE 156* 115*  BUN 10 9  CREATININE 1.05* 0.95  CALCIUM 8.2* 8.0*   PT/INR No results for input(s): "LABPROT", "INR" in the last 72 hours. CMP     Component Value Date/Time   NA 134 (L) 03/27/2023 0454   K 3.9 03/27/2023 0454   CL 101 03/27/2023 0454   CO2 26 03/27/2023 0454   GLUCOSE 115 (H) 03/27/2023 0454   BUN 9 03/27/2023 0454   CREATININE 0.95 03/27/2023 0454   CREATININE 1.04 (H) 01/22/2023 1301   CALCIUM 8.0 (L) 03/27/2023 0454   PROT 7.4 01/22/2023 1301   ALBUMIN 4.1 01/22/2023 1301   AST 16 01/22/2023 1301   ALT 27 01/22/2023 1301   ALKPHOS 58 01/22/2023 1301   BILITOT 0.7 01/22/2023 1301   GFRNONAA >60 03/27/2023 0454   GFRNONAA >60 01/22/2023  1301   GFRAA >60 11/16/2018 0530   Lipase     Component Value Date/Time   LIPASE 25 07/18/2018 0210     Assessment/Plan 42 yo female with 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 - Advance to soft diet, IVF at 50 ml/hr, SLIV when tolerating solid food - Pain control: continue prn oxycodone and dilaudid. Scheduled tylenol, toradol and robaxin. - Ambulate, PT ordered - POD1 drain amylase was not send. Needs to be sent tomorrow morning on POD3, discussed with nursing. - Will give post-splenectomy vaccines prior to discharge. - Acute blood loss anemia: hgb 9 this morning from 11, no signs of ongoing bleeding. Repeat H/H this afternoon, if hgb continues to downtrend will need to hold therapeutic lovenox until stabilized. 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 - Dispo: progressive care     LOS: 2 days    Sophronia Simas, MD [facility] Surgery General, Hepatobiliary and Pancreatic Surgery 03/27/23 9:45 AM

## 2023-03-27 NOTE — Evaluation (Signed)
Physical Therapy Evaluation Patient Details Name: Alexis Lindsey MRN: [id number] DOB: 1980/11/20 Today's Date: 03/27/2023  History of Present Illness  Patient is 42 y.o. female who underwent CT imaging for evaluation of an acute LE DVT. This incidentally showed a 2cm mass in the tail of the pancreas. EUS with FNA showed a low grade neuroendocrine tumor. There was no evidence of metastatic disease on imaging. Pt now s/p laparoscopic distal pancreatectomy with splenectomy, and colorrhaphy on 03/25/23.   Clinical Impression  Alexis Lindsey is 42 y.o. female admitted with above HPI and diagnosis. Patient is currently limited by functional impairments below (see PT problem list). Patient lives with spouse and children and is independent at baseline. Currently pt requires CGA for safety with bed mob, transfers, and gait with IV pole for support. Educated on use of RW to reduce abdominal discomfort. BP low but remained stable throughout mobility and pt able to ambulate ~100' with no LOB and good tolerance. Patient will benefit from continued skilled PT interventions to address impairments and progress independence with mobility. Acute PT will follow and progress as able.      Orthostatic VS for the past 24 hrs:  BP- Lying Pulse- Lying BP- Sitting Pulse- Sitting BP- Standing at 0 minutes Pulse- Standing at 0 minutes BP- Standing at 3 minutes Pulse- Standing at 3 minutes  03/27/23 1110 105/62 102 95/65 109 91/57 113 105/57 112       If plan is discharge home, recommend the following: A little help with walking and/or transfers;A little help with bathing/dressing/bathroom;Assistance with cooking/housework;Help with stairs or ramp for entrance;Assist for transportation   Can travel by private vehicle        Equipment Recommendations None recommended by PT  Recommendations for Other Services       Functional Status Assessment Patient has had a recent decline in their functional status and  demonstrates the ability to make significant improvements in function in a reasonable and predictable amount of time.     Precautions / Restrictions Precautions Precautions: Fall Precaution Comments: JP drain on Lt Restrictions Weight Bearing Restrictions: No      Mobility  Bed Mobility Overal bed mobility: Needs Assistance Bed Mobility: Rolling, Sidelying to Sit Rolling: Contact guard assist Sidelying to sit: Contact guard assist       General bed mobility comments: cues for sequencing log roll, pt using bed rail, CGA for safety    Transfers Overall transfer level: Needs assistance Equipment used: None Transfers: Sit to/from Stand Sit to Stand: Contact guard assist           General transfer comment: pt reliant on wide BOS and use of bil UE to power up from EOB and to control lower.    Ambulation/Gait Ambulation/Gait assistance: Contact guard assist Gait Distance (Feet): 100 Feet Assistive device: IV Pole Gait Pattern/deviations: Step-through pattern, Decreased step length - right, Decreased step length - left, Decreased stride length, Trunk flexed, Wide base of support Gait velocity: decr     General Gait Details: slow cautious gait, pt with reduced pain with bil UE support on IV pole, cues for hand placement and pt able to manage IV pole with CGA for safety. no LOB noted, VSS throughout.  Stairs            Wheelchair Mobility     Tilt Bed    Modified Rankin (Stroke Patients Only)       Balance Overall balance assessment: Mild deficits observed, not formally tested  Pertinent Vitals/Pain Pain Assessment Pain Assessment: Faces Faces Pain Scale: Hurts little more Pain Location: abdomen Pain Descriptors / Indicators: Discomfort, Grimacing, Guarding Pain Intervention(s): Limited activity within patient's tolerance, Monitored during session, Repositioned    Home Living Family/patient  expects to be discharged to:: Private residence Living Arrangements: Spouse/significant other;Children Available Help at Discharge: Family Type of Home: House Home Access: Stairs to enter Entrance Stairs-Rails: None Secretary/administrator of Steps: 3   Home Layout: One level Home Equipment: Agricultural consultant (2 wheels) Additional Comments: has RW left from May when she had her Lt LE DVT    Prior Function Prior Level of Function : Independent/Modified Independent                     Extremity/Trunk Assessment   Upper Extremity Assessment Upper Extremity Assessment: Overall WFL for tasks assessed    Lower Extremity Assessment Lower Extremity Assessment: Overall WFL for tasks assessed    Cervical / Trunk Assessment Cervical / Trunk Assessment: Normal;Other exceptions Cervical / Trunk Exceptions: flexed posture due to pain  Communication   Communication Communication: No apparent difficulties  Cognition Arousal: Alert Behavior During Therapy: WFL for tasks assessed/performed Overall Cognitive Status: Within Functional Limits for tasks assessed                                          General Comments      Exercises     Assessment/Plan    PT Assessment Patient needs continued PT services  PT Problem List Decreased activity tolerance;Decreased balance;Decreased mobility;Decreased knowledge of use of DME;Decreased knowledge of precautions;Pain       PT Treatment Interventions DME instruction;Gait training;Stair training;Functional mobility training;Therapeutic activities;Therapeutic exercise;Balance training;Neuromuscular re-education;Patient/family education;Wheelchair mobility training    PT Goals (Current goals can be found in the Care Plan section)  Acute Rehab PT Goals Patient Stated Goal: stop hurting and regain energy PT Goal Formulation: With patient/family Time For Goal Achievement: 04/10/23 Potential to Achieve Goals: Good     Frequency Min 1X/week     Co-evaluation               AM-PAC PT "6 Clicks" Mobility  Outcome Measure Help needed turning from your back to your side while in a flat bed without using bedrails?: A Little Help needed moving from lying on your back to sitting on the side of a flat bed without using bedrails?: A Little Help needed moving to and from a bed to a chair (including a wheelchair)?: A Little Help needed standing up from a chair using your arms (e.g., wheelchair or bedside chair)?: A Little Help needed to walk in hospital room?: A Little Help needed climbing 3-5 steps with a railing? : A Little 6 Click Score: 18    End of Session Equipment Utilized During Treatment: Gait belt Activity Tolerance: Patient tolerated treatment well Patient left: in chair;with call bell/phone within reach;with family/visitor present Nurse Communication: Mobility status PT Visit Diagnosis: Muscle weakness (generalized) (M62.81);Difficulty in walking, not elsewhere classified (R26.2);Other abnormalities of gait and mobility (R26.89);Unsteadiness on feet (R26.81);Pain Pain - part of body:  (abdomen)    Time: 5409-8119 PT Time Calculation (min) (ACUTE ONLY): 24 min   Charges:   PT Evaluation $PT Eval Low Complexity: 1 Low PT Treatments $Gait Training: 8-22 mins PT General Charges $$ ACUTE PT VISIT: 1 Visit  Wynn Maudlin, DPT Acute Rehabilitation Services Office [phone]  03/27/23 2:20 PM

## 2023-03-27 NOTE — Progress Notes (Signed)
2342 RN reached out to MD on call, [place], regarding pt sustaining HR of 121. He advised observation for tonight, no new orders at this time.

## 2023-03-28 LAB — BASIC METABOLIC PANEL
Anion gap: 7 (ref 5–15)
BUN: 10 mg/dL (ref 6–20)
CO2: 22 mmol/L (ref 22–32)
Calcium: 7.7 mg/dL — ABNORMAL LOW (ref 8.9–10.3)
Chloride: 103 mmol/L (ref 98–111)
Creatinine, Ser: 1.13 mg/dL — ABNORMAL HIGH (ref 0.44–1.00)
GFR, Estimated: >60 mL/min (ref >60)
Glucose, Bld: 93 mg/dL (ref 70–99)
Potassium: 4.2 mmol/L (ref 3.5–5.1)
Sodium: 132 mmol/L — ABNORMAL LOW (ref 135–145)

## 2023-03-28 LAB — GLUCOSE, CAPILLARY
Glucose-Capillary: 88 mg/dL (ref 70–99)
Glucose-Capillary: 94 mg/dL (ref 70–99)
Glucose-Capillary: 89 mg/dL (ref 70–99)
Glucose-Capillary: 106 mg/dL — ABNORMAL HIGH (ref 70–99)
Glucose-Capillary: 120 mg/dL — ABNORMAL HIGH (ref 70–99)
Glucose-Capillary: 122 mg/dL — ABNORMAL HIGH (ref 70–99)

## 2023-03-28 LAB — CBC
HCT: 27.5 % — ABNORMAL LOW (ref 36.0–46.0)
Hemoglobin: 8.9 g/dL — ABNORMAL LOW (ref 12.0–15.0)
MCH: 29.4 pg (ref 26.0–34.0)
MCHC: 32.4 g/dL (ref 30.0–36.0)
MCV: 90.8 fL (ref 80.0–100.0)
Platelets: 269 10*3/uL (ref 150–400)
RBC: 3.03 MIL/uL — ABNORMAL LOW (ref 3.87–5.11)
RDW: 13.2 % (ref 11.5–15.5)
WBC: 28.7 10*3/uL — ABNORMAL HIGH (ref 4.0–10.5)
nRBC: 0 % (ref 0.0–0.2)

## 2023-03-28 MED ORDER — SODIUM CHLORIDE 0.9 % IV BOLUS
1000.0000 mL | Freq: Once | INTRAVENOUS | Status: AC
  Administered 2023-03-28: 1000 mL via INTRAVENOUS

## 2023-03-28 MED ORDER — LACTATED RINGERS IV BOLUS
1000.0000 mL | Freq: Once | INTRAVENOUS | Status: DC
Start: 2023-03-28 — End: 2023-03-28

## 2023-03-28 MED ORDER — OXYCODONE HCL 5 MG PO TABS
10.0000 mg | ORAL_TABLET | ORAL | Status: DC | PRN
  Administered 2023-03-28: 15 mg via ORAL
  Administered 2023-03-28: 10 mg via ORAL
  Administered 2023-03-28: 15 mg via ORAL
  Administered 2023-03-29: 10 mg via ORAL
  Administered 2023-03-29 (×2): 15 mg via ORAL
  Administered 2023-03-29 (×2): 10 mg via ORAL
  Administered 2023-03-30 (×3): 15 mg via ORAL
  Filled 2023-03-28 (×3): qty 3
  Filled 2023-03-28: qty 2
  Filled 2023-03-28 (×2): qty 3
  Filled 2023-03-28: qty 2
  Filled 2023-03-28 (×2): qty 3
  Filled 2023-03-28 (×2): qty 2
  Filled 2023-03-28: qty 3

## 2023-03-28 MED ORDER — HYDROMORPHONE HCL 1 MG/ML IJ SOLN
0.5000 mg | INTRAMUSCULAR | Status: DC | PRN
  Administered 2023-03-28 – 2023-03-30 (×9): 0.5 mg via INTRAVENOUS
  Filled 2023-03-28 (×9): qty 0.5

## 2023-03-28 NOTE — Plan of Care (Signed)

## 2023-03-28 NOTE — Evaluation (Signed)
Occupational Therapy Evaluation Patient Details Name: Alexis Lindsey MRN: [id number] DOB: Jul 26, 1981 Today's Date: 03/28/2023   History of Present Illness Patient is 42 y.o. female who underwent CT imaging for evaluation of an acute LE DVT. This incidentally showed a 2cm mass in the tail of the pancreas. EUS with FNA showed a low grade neuroendocrine tumor. There was no evidence of metastatic disease on imaging. Pt now s/p laparoscopic distal pancreatectomy with splenectomy, and colorrhaphy on 03/25/23.   Clinical Impression   Pt is typically independent in ADL and mobility. 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 Today she is overall min guard progressing towards supervision for bed mobility and hallway mobility without DME. She does require increased time due to pain and wider BOS. During ambulation her RR got as high as 41 and she required cues for PLB. SpO2 WFL. She is able to perform figure 4 but is overall mod A for LB ADL for pain. Educated in AE kit (grabber/reacher, sock donner, long handle sponge, long handle shoe horn, and toilet aide). Pt will benefit from continued OT in the acute setting, but do not anticipate the need for post-acute therapy. Next session to focus on continued activity tolerance and energy conservation balance (please take EC handout)       If plan is discharge home, recommend the following: A little help with walking and/or transfers;A little help with bathing/dressing/bathroom;Assistance with cooking/housework;Assist for transportation;Help with stairs or ramp for entrance    Functional Status Assessment  Patient has had a recent decline in their functional status and demonstrates the ability to make significant improvements in function in a reasonable and predictable amount of time.  Equipment Recommendations  BSC/3in1    Recommendations for Other Services PT consult     Precautions / Restrictions Precautions Precautions: Fall Precaution  Comments: JP drain on Lt Restrictions Weight Bearing Restrictions: No      Mobility Bed Mobility Overal bed mobility: Needs Assistance Bed Mobility: Rolling, Sidelying to Sit, Sit to Supine Rolling: Supervision Sidelying to sit: Supervision   Sit to supine: Supervision   General bed mobility comments: use of bed rail to assist    Transfers Overall transfer level: Needs assistance Equipment used: None Transfers: Sit to/from Stand Sit to Stand: Contact guard assist           General transfer comment: increased time, wide BOS      Balance Overall balance assessment: Mild deficits observed, not formally tested                                         ADL either performed or assessed with clinical judgement   ADL Overall ADL's : Needs assistance/impaired Eating/Feeding: Independent   Grooming: Supervision/safety;Standing;Wash/dry hands Grooming Details (indicate cue type and reason): sink level, able to maintain without LOB Upper Body Bathing: Minimal assistance Upper Body Bathing Details (indicate cue type and reason): for back, educated long handle sponge Lower Body Bathing: Moderate assistance;Sitting/lateral leans Lower Body Bathing Details (indicate cue type and reason): knees down - able to perform figure 4 but also educated on AE - long handle sponge Upper Body Dressing : Set up;Sitting   Lower Body Dressing: Moderate assistance;Sitting/lateral leans;Sit to/from stand Lower Body Dressing Details (indicate cue type and reason): knees down. able to perform figure 4 but also educated in AE Merchant navy officer, long handle shoe horn, sock donner) Toilet Transfer: Contact guard  assist;Ambulation   Toileting- Architect and Hygiene: Moderate assistance;Sitting/lateral lean Toileting - Clothing Manipulation Details (indicate cue type and reason): Pt reports more trouble with access to front than rear for peri care. educated in toilet aide with  demonstration. Pt verbalized understanding. Tub/ Shower Transfer: Contact guard assist;Ambulation   Functional mobility during ADLs: Contact guard assist (progressing towards supervision with extra time) General ADL Comments: pain limiting access to LB, educated in AE kit     Vision Ability to See in Adequate Light: 0 Adequate Patient Visual Report: No change from baseline Vision Assessment?: No apparent visual deficits     Perception Perception: Not tested       Praxis Praxis: Not tested       Pertinent Vitals/Pain Pain Assessment Pain Assessment: Faces Faces Pain Scale: Hurts little more Pain Location: abdomen Pain Descriptors / Indicators: Discomfort, Grimacing, Guarding Pain Intervention(s): Monitored during session, Repositioned     Extremity/Trunk Assessment Upper Extremity Assessment Upper Extremity Assessment: Overall WFL for tasks assessed   Lower Extremity Assessment Lower Extremity Assessment: Overall WFL for tasks assessed   Cervical / Trunk Assessment Cervical / Trunk Assessment: Normal;Other exceptions Cervical / Trunk Exceptions: slightly flexed posture due to pain   Communication Communication Communication: No apparent difficulties   Cognition Arousal: Alert Behavior During Therapy: WFL for tasks assessed/performed Overall Cognitive Status: Within Functional Limits for tasks assessed                                       General Comments  on RA throughout session, RR up to 41 - cues for slower deeper breaths    Exercises     Shoulder Instructions      Home Living Family/patient expects to be discharged to:: Private residence Living Arrangements: Spouse/significant other;Children Available Help at Discharge: Family Type of Home: House Home Access: Stairs to enter Secretary/administrator of Steps: 3 Entrance Stairs-Rails: None Home Layout: One level     Bathroom Shower/Tub: Chief Strategy Officer:  Standard Bathroom Accessibility: Yes   Home Equipment: Agricultural consultant (2 wheels)   Additional Comments: has RW left from May when she had her Lt LE DVT      Prior Functioning/Environment Prior Level of Function : Independent/Modified Independent             Mobility Comments: independent ADLs Comments: independent        OT Problem List: Decreased range of motion;Decreased activity tolerance;Impaired balance (sitting and/or standing);Decreased knowledge of use of DME or AE;Decreased knowledge of precautions;Pain      OT Treatment/Interventions: Self-care/ADL training;Energy conservation;DME and/or AE instruction;Therapeutic activities;Patient/family education;Balance training    OT Goals(Current goals can be found in the care plan section) Acute Rehab OT Goals Patient Stated Goal: be able to get back to full strength and play with my kids OT Goal Formulation: With patient Time For Goal Achievement: 04/11/23 Potential to Achieve Goals: Good ADL Goals Pt Will Perform Grooming: with modified independence;standing Pt Will Perform Lower Body Bathing: with modified independence;with adaptive equipment;sit to/from stand Pt Will Perform Lower Body Dressing: with modified independence;sit to/from stand;with adaptive equipment Pt Will Transfer to Toilet: with modified independence;ambulating Pt Will Perform Toileting - Clothing Manipulation and hygiene: with modified independence;with adaptive equipment;sitting/lateral leans Additional ADL Goal #1: Pt will verbalize 3 energy conservation stratgies to implement during ADL routine with no cues  OT Frequency: Min 1X/week  Co-evaluation              AM-PAC OT "6 Clicks" Daily Activity     Outcome Measure Help from another person eating meals?: None Help from another person taking care of personal grooming?: None Help from another person toileting, which includes using toliet, bedpan, or urinal?: A Little Help from another  person bathing (including washing, rinsing, drying)?: A Little Help from another person to put on and taking off regular upper body clothing?: None Help from another person to put on and taking off regular lower body clothing?: A Little 6 Click Score: 21   End of Session Nurse Communication: Mobility status;Precautions  Activity Tolerance: Patient tolerated treatment well Patient left: in bed;with call bell/phone within reach  OT Visit Diagnosis: Other abnormalities of gait and mobility (R26.89);Muscle weakness (generalized) (M62.81);Pain Pain - Right/Left: Left Pain - part of body:  (abdomen)                Time: 4403-4742 OT Time Calculation (min): 26 min Charges:  OT General Charges $OT Visit: 1 Visit OT Evaluation $OT Eval Moderate Complexity: 1 Mod OT Treatments $Self Care/Home Management : 8-22 mins  Nyoka Cowden OTR/L Acute Rehabilitation Services Office: [phone]  Evern Bio [facility] 03/28/2023, 5:22 PM

## 2023-03-28 NOTE — Progress Notes (Signed)
Trauma/Critical Care Follow Up Note  Subjective:    Overnight Issues:   Objective:  Vital signs for last 24 hours: Temp:  [98.1 F (36.7 C)-98.6 F (37 C)] 98.4 F (36.9 C) (08/31 0759) Pulse Rate:  [96-121] 108 (08/31 0759) Resp:  [6-21] 19 (08/31 0759) BP: (95-120)/(52-69) 105/56 (08/31 0759) SpO2:  [89 %-98 %] 94 % (08/31 0759)  Hemodynamic parameters for last 24 hours:    Intake/Output from previous day: 08/30 0701 - 08/31 0700 In: 1398.7 [I.V.:1398.7] Out: 35 [Drains:35]  Intake/Output this shift: No intake/output data recorded.  Vent settings for last 24 hours:    Physical Exam:  Gen: comfortable, no distress Neuro: follows commands, alert, communicative HEENT: PERRL Neck: supple CV: tachycardic Pulm: unlabored breathing on Hooker Abd: soft, NT, incision clean, dry, intact, JP SS, peri-incisional abdominal bruising GU: urine clear and yellow, +spontaneous voids Extr: wwp, no edema  Results for orders placed or performed during the hospital encounter of 03/25/23 (from the past 24 hour(s))  Glucose, capillary     Status: Abnormal   Collection Time: 03/27/23 11:36 AM  Result Value Ref Range   Glucose-Capillary 128 (H) 70 - 99 mg/dL  Hemoglobin and hematocrit, blood     Status: Abnormal   Collection Time: 03/27/23  1:59 PM  Result Value Ref Range   Hemoglobin 9.9 (L) 12.0 - 15.0 g/dL   HCT 16.1 (L) 09.6 - 04.5 %  Glucose, capillary     Status: Abnormal   Collection Time: 03/27/23  2:57 PM  Result Value Ref Range   Glucose-Capillary 120 (H) 70 - 99 mg/dL  Glucose, capillary     Status: Abnormal   Collection Time: 03/27/23  7:21 PM  Result Value Ref Range   Glucose-Capillary 124 (H) 70 - 99 mg/dL  Glucose, capillary     Status: None   Collection Time: 03/27/23 11:08 PM  Result Value Ref Range   Glucose-Capillary 89 70 - 99 mg/dL  Glucose, capillary     Status: None   Collection Time: 03/28/23  4:05 AM  Result Value Ref Range   Glucose-Capillary 88 70  - 99 mg/dL  CBC     Status: Abnormal   Collection Time: 03/28/23  4:18 AM  Result Value Ref Range   WBC 28.7 (H) 4.0 - 10.5 K/uL   RBC 3.03 (L) 3.87 - 5.11 MIL/uL   Hemoglobin 8.9 (L) 12.0 - 15.0 g/dL   HCT 40.9 (L) 81.1 - 91.4 %   MCV 90.8 80.0 - 100.0 fL   MCH 29.4 26.0 - 34.0 pg   MCHC 32.4 30.0 - 36.0 g/dL   RDW 78.2 95.6 - 21.3 %   Platelets 269 150 - 400 K/uL   nRBC 0.0 0.0 - 0.2 %  Basic metabolic panel     Status: Abnormal   Collection Time: 03/28/23  4:18 AM  Result Value Ref Range   Sodium 132 (L) 135 - 145 mmol/L   Potassium 4.2 3.5 - 5.1 mmol/L   Chloride 103 98 - 111 mmol/L   CO2 22 22 - 32 mmol/L   Glucose, Bld 93 70 - 99 mg/dL   BUN 10 6 - 20 mg/dL   Creatinine, Ser 0.86 (H) 0.44 - 1.00 mg/dL   Calcium 7.7 (L) 8.9 - 10.3 mg/dL   GFR, Estimated >57 >84 mL/min   Anion gap 7 5 - 15  Glucose, capillary     Status: None   Collection Time: 03/28/23  8:01 AM  Result Value Ref Range  Glucose-Capillary 94 70 - 99 mg/dL    Assessment & Plan: The plan of care was discussed with the bedside nurse for the day, who is in agreement with this plan and no additional concerns were raised.   Present on Admission:  Pancreatic mass  Primary pancreatic neuroendocrine tumor    LOS: 3 days   Additional comments:I reviewed the patient's new clinical lab test results.   and I reviewed the patients new imaging test results.    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 - Emesis o/n, tachy/soft BPs, mild AKI - bolus NS and incr MIVF to 75/h - Pain control: continue prn oxycodone and dilaudid. Scheduled tylenol, toradol and robaxin. Incr oxy scale today. - Ambulate, PT ordered - POD1 drain amylase was not sent. Needs to be sent today, POD3, discussed with nursing. - Will give post-splenectomy vaccines prior to discharge. - Acute blood loss anemia: hgb 8.9 this morning from  9.6, no signs of active bleeding, though significant abdominal bruising present. Cont LMWH, repeat CBC in AM, though with bolus, suspect hgb will drift down more and will likely need to hold a dose or two. 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 - Dispo: progressive care    Diamantina Monks, MD Trauma & General Surgery Please use [email or web address] to contact on call provider  03/28/2023  *Care during the described time interval was provided by me. I have reviewed this patient's available data, including medical history, events of note, physical examination and test results as part of my evaluation.

## 2023-03-29 LAB — BASIC METABOLIC PANEL
Anion gap: 10 (ref 5–15)
BUN: 9 mg/dL (ref 6–20)
CO2: 21 mmol/L — ABNORMAL LOW (ref 22–32)
Calcium: 7.9 mg/dL — ABNORMAL LOW (ref 8.9–10.3)
Chloride: 104 mmol/L (ref 98–111)
Creatinine, Ser: 0.87 mg/dL (ref 0.44–1.00)
GFR, Estimated: >60 mL/min (ref >60)
Glucose, Bld: 101 mg/dL — ABNORMAL HIGH (ref 70–99)
Potassium: 3.7 mmol/L (ref 3.5–5.1)
Sodium: 135 mmol/L (ref 135–145)

## 2023-03-29 LAB — GLUCOSE, CAPILLARY
Glucose-Capillary: 99 mg/dL (ref 70–99)
Glucose-Capillary: 97 mg/dL (ref 70–99)
Glucose-Capillary: 94 mg/dL (ref 70–99)
Glucose-Capillary: 90 mg/dL (ref 70–99)
Glucose-Capillary: 111 mg/dL — ABNORMAL HIGH (ref 70–99)
Glucose-Capillary: 93 mg/dL (ref 70–99)

## 2023-03-29 LAB — CBC
HCT: 29.4 % — ABNORMAL LOW (ref 36.0–46.0)
Hemoglobin: 9.5 g/dL — ABNORMAL LOW (ref 12.0–15.0)
MCH: 29.7 pg (ref 26.0–34.0)
MCHC: 32.3 g/dL (ref 30.0–36.0)
MCV: 91.9 fL (ref 80.0–100.0)
Platelets: 331 10*3/uL (ref 150–400)
RBC: 3.2 MIL/uL — ABNORMAL LOW (ref 3.87–5.11)
RDW: 13.3 % (ref 11.5–15.5)
WBC: 25.3 10*3/uL — ABNORMAL HIGH (ref 4.0–10.5)
nRBC: 0.1 % (ref 0.0–0.2)

## 2023-03-29 NOTE — Progress Notes (Signed)
Progress Note: General Surgery Service   Chief Complaint/Subjective: Nausea improved, tolerating diet Pain mostly around ribs near liver retractor incision  Objective: Vital signs in last 24 hours: Temp:  [98 F (36.7 C)-98.6 F (37 C)] 98.2 F (36.8 C) (09/01 0846) Pulse Rate:  [101-116] 116 (09/01 0304) Resp:  [11-25] 20 (09/01 0304) BP: (102-119)/(59-82) 119/78 (09/01 0846) SpO2:  [90 %-96 %] 90 % (09/01 0304) Last BM Date :  (PTA)  Intake/Output from previous day: 08/31 0701 - 09/01 0700 In: -  Out: 13 [Drains:13] Intake/Output this shift: No intake/output data recorded.  GI: Abd incisions c/d/I, JP serosanguinous  Lab Results: CBC  Recent Labs    03/28/23 0418 03/29/23 0433  WBC 28.7* 25.3*  HGB 8.9* 9.5*  HCT 27.5* 29.4*  PLT 269 331   BMET Recent Labs    03/28/23 0418 03/29/23 0433  NA 132* 135  K 4.2 3.7  CL 103 104  CO2 22 21*  GLUCOSE 93 101*  BUN 10 9  CREATININE 1.13* 0.87  CALCIUM 7.7* 7.9*   PT/INR No results for input(s): "LABPROT", "INR" in the last 72 hours. ABG No results for input(s): "PHART", "HCO3" in the last 72 hours.  Invalid input(s): "PCO2", "PO2"  Anti-infectives: Anti-infectives (From admission, onward)    Start     Dose/Rate Route Frequency Ordered Stop   03/25/23 1115  ceFAZolin (ANCEF) IVPB 2g/100 mL premix       Placed in "And" Linked Group   2 g 200 mL/hr over 30 Minutes Intravenous On call to O.R. 03/25/23 1107 03/25/23 1303   03/25/23 1115  metroNIDAZOLE (FLAGYL) IVPB 500 mg       Placed in "And" Linked Group   500 mg 100 mL/hr over 60 Minutes Intravenous On call to O.R. 03/25/23 1107 03/25/23 1307   03/25/23 1109  metroNIDAZOLE (FLAGYL) 500 MG/100ML IVPB       Note to Pharmacy: Lacie Draft A: cabinet override      03/25/23 1109 03/25/23 1307   03/25/23 1109  ceFAZolin (ANCEF) 2-4 GM/100ML-% IVPB       Note to Pharmacy: Lacie Draft A: cabinet override      03/25/23 1109 03/25/23 1304        Medications: Scheduled Meds:  acetaminophen  1,000 mg Oral Q6H   buPROPion  450 mg Oral Daily   docusate sodium  100 mg Oral BID   enoxaparin (LOVENOX) injection  80 mg Subcutaneous Q12H   insulin aspart  0-9 Units Subcutaneous Q4H   lisdexamfetamine  70 mg Oral Daily   methocarbamol  1,000 mg Oral QID   metoCLOPramide (REGLAN) injection  10 mg Intravenous Q6H   Continuous Infusions:  lactated ringers 75 mL/hr at 03/28/23 2339   PRN Meds:.clonazePAM, diphenhydrAMINE **OR** diphenhydrAMINE, HYDROmorphone (DILAUDID) injection, melatonin, ondansetron **OR** ondansetron (ZOFRAN) IV, oxyCODONE, prochlorperazine  Assessment/Plan: Ms. 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 Freida Busman.  - Soft diet - nausea improved since the other night - Pain control: continue prn oxycodone and dilaudid. Scheduled tylenol, toradol and robaxin.  Most pain seems to be near liver retractor incision - Ambulate, PT ordered - Drain amylase pending as of 8/31 0413 - Post-splenectomy vaccines prior to discharge. - Acute blood loss anemia: HGB stabilized at 9.5 today. 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 - Dispo: transfer to the floor   LOS:  4 days   FEN: Soft ID: none - vaccines as above VTE: therapeutic lovenox Foley: none, voiding Dispo: to 6N   Quentin Ore, MD  [facility] Use [email or web address] to contact on call provider  Daily Billing: [id number] - post op

## 2023-03-29 NOTE — Anesthesia Postprocedure Evaluation (Signed)
Anesthesia Post Note  Patient: ZEBA CERIO  Procedure(s) Performed: LAPAROSCOPIC DISTAL PANCREATECTOMY WITH INTRAOPERATIVE ULTRASOUND (Abdomen) SPLENECTOMY (Abdomen)     Patient location during evaluation: PACU Anesthesia Type: General Level of consciousness: sedated Pain management: pain level controlled Vital Signs Assessment: post-procedure vital signs reviewed and stable Respiratory status: spontaneous breathing Cardiovascular status: stable Postop Assessment: no apparent nausea or vomiting Anesthetic complications: no  No notable events documented.  Last Vitals:  Vitals:   03/28/23 2315 03/29/23 0304  BP: 114/62 116/82  Pulse: (!) 101 (!) 116  Resp: 18 20  Temp: 36.7 C 36.8 C  SpO2: 93% 90%    Last Pain:  Vitals:   03/29/23 0433  TempSrc:   PainSc: Asleep   Pain Goal: Patients Stated Pain Goal: 0 (03/29/23 0400)                 Caren Macadam

## 2023-03-29 NOTE — Plan of Care (Signed)
  Problem: Education: Goal: Knowledge of General Education information will improve Description: Including pain rating scale, medication(s)/side effects and non-pharmacologic comfort measures Outcome: Progressing   Problem: Health Behavior/Discharge Planning: Goal: Ability to manage health-related needs will improve Outcome: Progressing   Problem: Clinical Measurements: Goal: Ability to maintain clinical measurements within normal limits will improve Outcome: Progressing Goal: Will remain free from infection Outcome: Progressing Goal: Diagnostic test results will improve Outcome: Progressing Goal: Respiratory complications will improve Outcome: Progressing Goal: Cardiovascular complication will be avoided Outcome: Progressing   Problem: Activity: Goal: Risk for activity intolerance will decrease Outcome: Progressing   Problem: Nutrition: Goal: Adequate nutrition will be maintained Outcome: Progressing   Problem: Coping: Goal: Level of anxiety will decrease Outcome: Progressing   Problem: Safety: Goal: Ability to remain free from injury will improve Outcome: Progressing   

## 2023-03-30 LAB — CBC
HCT: 27.7 % — ABNORMAL LOW (ref 36.0–46.0)
Hemoglobin: 9.1 g/dL — ABNORMAL LOW (ref 12.0–15.0)
MCH: 30 pg (ref 26.0–34.0)
MCHC: 32.9 g/dL (ref 30.0–36.0)
MCV: 91.4 fL (ref 80.0–100.0)
Platelets: 399 10*3/uL (ref 150–400)
RBC: 3.03 MIL/uL — ABNORMAL LOW (ref 3.87–5.11)
RDW: 13.8 % (ref 11.5–15.5)
WBC: 24.6 10*3/uL — ABNORMAL HIGH (ref 4.0–10.5)
nRBC: 0.2 % (ref 0.0–0.2)

## 2023-03-30 LAB — BASIC METABOLIC PANEL
Anion gap: 8 (ref 5–15)
BUN: <5 mg/dL — ABNORMAL LOW (ref 6–20)
CO2: 23 mmol/L (ref 22–32)
Calcium: 8 mg/dL — ABNORMAL LOW (ref 8.9–10.3)
Chloride: 101 mmol/L (ref 98–111)
Creatinine, Ser: 0.79 mg/dL (ref 0.44–1.00)
GFR, Estimated: >60 mL/min (ref >60)
Glucose, Bld: 94 mg/dL (ref 70–99)
Potassium: 4.1 mmol/L (ref 3.5–5.1)
Sodium: 132 mmol/L — ABNORMAL LOW (ref 135–145)

## 2023-03-30 LAB — GLUCOSE, CAPILLARY
Glucose-Capillary: 94 mg/dL (ref 70–99)
Glucose-Capillary: 99 mg/dL (ref 70–99)
Glucose-Capillary: 110 mg/dL — ABNORMAL HIGH (ref 70–99)
Glucose-Capillary: 103 mg/dL — ABNORMAL HIGH (ref 70–99)
Glucose-Capillary: 108 mg/dL — ABNORMAL HIGH (ref 70–99)

## 2023-03-30 LAB — AMYLASE, BODY FLUID (OTHER): Amylase, Body Fluid: 3134 U/L

## 2023-03-30 MED ORDER — MAGNESIUM HYDROXIDE 400 MG/5ML PO SUSP
30.0000 mL | Freq: Once | ORAL | Status: AC
  Administered 2023-03-30: 30 mL via ORAL
  Filled 2023-03-30: qty 30

## 2023-03-30 MED ORDER — TRAMADOL HCL 50 MG PO TABS
100.0000 mg | ORAL_TABLET | Freq: Four times a day (QID) | ORAL | Status: DC
  Administered 2023-03-30 – 2023-03-31 (×5): 100 mg via ORAL
  Filled 2023-03-30 (×5): qty 2

## 2023-03-30 MED ORDER — MAGNESIUM CITRATE PO SOLN
0.5000 | Freq: Once | ORAL | Status: AC
  Administered 2023-03-30: 0.5 via ORAL
  Filled 2023-03-30: qty 296

## 2023-03-30 MED ORDER — POLYETHYLENE GLYCOL 3350 17 G PO PACK
17.0000 g | PACK | Freq: Every day | ORAL | Status: DC | PRN
  Administered 2023-03-30: 17 g via ORAL
  Filled 2023-03-30: qty 1

## 2023-03-30 MED ORDER — BISACODYL 5 MG PO TBEC
10.0000 mg | DELAYED_RELEASE_TABLET | Freq: Once | ORAL | Status: AC
  Administered 2023-03-30: 10 mg via ORAL
  Filled 2023-03-30: qty 2

## 2023-03-30 MED ORDER — BISACODYL 10 MG RE SUPP: 10.0000 mg | Freq: Once | RECTAL | Status: DC

## 2023-03-30 MED ORDER — SENNA 8.6 MG PO TABS
2.0000 | ORAL_TABLET | Freq: Once | ORAL | Status: AC
  Administered 2023-03-30: 17.2 mg via ORAL
  Filled 2023-03-30: qty 2

## 2023-03-30 NOTE — Progress Notes (Signed)
Trauma/Critical Care Follow Up Note  Subjective:    Overnight Issues:   Objective:  Vital signs for last 24 hours: Temp:  [98 F (36.7 C)-98.5 F (36.9 C)] 98.5 F (36.9 C) (09/02 0727) Pulse Rate:  [104-105] 104 (09/02 0727) Resp:  [17-20] 19 (09/02 0727) BP: (99-120)/(61-66) 99/62 (09/02 0727) SpO2:  [94 %-97 %] 94 % (09/02 0727)  Hemodynamic parameters for last 24 hours:    Intake/Output from previous day: 09/01 0701 - 09/02 0700 In: 60 [P.O.:60] Out: 14 [Drains:14]  Intake/Output this shift: Total I/O In: 240 [P.O.:240] Out: -   Vent settings for last 24 hours:    Physical Exam:  Gen: comfortable, no distress Neuro: follows commands, alert, communicative HEENT: PERRL Neck: supple CV: RRR Pulm: unlabored breathing on RA Abd: soft, NT, incision clean, dry, intact, JP SS GU: urine clear and yellow, +spontaneous voids Extr: wwp, no edema  Results for orders placed or performed during the hospital encounter of 03/25/23 (from the past 24 hour(s))  Glucose, capillary     Status: None   Collection Time: 03/29/23 11:56 AM  Result Value Ref Range   Glucose-Capillary 94 70 - 99 mg/dL  Glucose, capillary     Status: None   Collection Time: 03/29/23  4:09 PM  Result Value Ref Range   Glucose-Capillary 90 70 - 99 mg/dL  Glucose, capillary     Status: Abnormal   Collection Time: 03/29/23  7:27 PM  Result Value Ref Range   Glucose-Capillary 111 (H) 70 - 99 mg/dL  Glucose, capillary     Status: None   Collection Time: 03/29/23 11:11 PM  Result Value Ref Range   Glucose-Capillary 93 70 - 99 mg/dL  CBC     Status: Abnormal   Collection Time: 03/30/23  4:32 AM  Result Value Ref Range   WBC 24.6 (H) 4.0 - 10.5 K/uL   RBC 3.03 (L) 3.87 - 5.11 MIL/uL   Hemoglobin 9.1 (L) 12.0 - 15.0 g/dL   HCT 54.0 (L) 98.1 - 19.1 %   MCV 91.4 80.0 - 100.0 fL   MCH 30.0 26.0 - 34.0 pg   MCHC 32.9 30.0 - 36.0 g/dL   RDW 47.8 29.5 - 62.1 %   Platelets 399 150 - 400 K/uL   nRBC  0.2 0.0 - 0.2 %  Basic metabolic panel     Status: Abnormal   Collection Time: 03/30/23  4:32 AM  Result Value Ref Range   Sodium 132 (L) 135 - 145 mmol/L   Potassium 4.1 3.5 - 5.1 mmol/L   Chloride 101 98 - 111 mmol/L   CO2 23 22 - 32 mmol/L   Glucose, Bld 94 70 - 99 mg/dL   BUN <5 (L) 6 - 20 mg/dL   Creatinine, Ser 3.08 0.44 - 1.00 mg/dL   Calcium 8.0 (L) 8.9 - 10.3 mg/dL   GFR, Estimated >65 >78 mL/min   Anion gap 8 5 - 15  Glucose, capillary     Status: None   Collection Time: 03/30/23  7:28 AM  Result Value Ref Range   Glucose-Capillary 94 70 - 99 mg/dL    Assessment & Plan: The plan of care was discussed with the bedside nurse for the day, who is in agreement with this plan and no additional concerns were raised.   Present on Admission:  Pancreatic mass  Primary pancreatic neuroendocrine tumor    LOS: 5 days   Additional comments:I reviewed the patient's new clinical lab test results.  and I reviewed the patients new imaging test results.    S/p laparoscopic distal pancreatectomy and splenectomy for pancreatic neuroendocrine tumor on 8/28, with Dr. Freida Busman   - Soft diet - nausea improved since the other night - Pain control: continue prn oxycodone and dilaudid. Scheduled tylenol, toradol and robaxin. Add sch tramadol today. Most pain seems to be near liver retractor incision - Ambulate, PT ordered - Drain amylase pending as of 8/31 0413 - Post-splenectomy vaccines prior to discharge. - Acute blood loss anemia: HGB stabilized. 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 - Dispo: transfer to the floor    LOS: 4 days    FEN: Soft, escalate bowel regimen ID: none - vaccines as above VTE: therapeutic lovenox Foley: none, voiding Dispo: to 6N    Diamantina Monks, MD Trauma & General Surgery Please use [email or web address] to contact on call provider  03/30/2023  *Care during the described time  interval was provided by me. I have reviewed this patient's available data, including medical history, events of note, physical examination and test results as part of my evaluation.

## 2023-03-30 NOTE — Progress Notes (Signed)
Occupational Therapy Treatment Patient Details Name: Alexis Lindsey MRN: [id number] DOB: [birth year] Today's Date: 03/30/2023   History of present illness Patient is 42 y.o. female who underwent CT imaging for evaluation of an acute LE DVT. This incidentally showed a 2cm mass in the tail of the pancreas. EUS with FNA showed a low grade neuroendocrine tumor. There was no evidence of metastatic disease on imaging. Pt now s/p laparoscopic distal pancreatectomy with splenectomy, and colorrhaphy on 03/25/23.   OT comments  Pt demonstrates need for further energy conservation as she has decreased oxygen with mobility. Pt noted to be on day 6 without ability to void so encouraged increased mobilization today to help. Recommendations for home d/c with family when medically ready.       If plan is discharge home, recommend the following:  A little help with walking and/or transfers;A little help with bathing/dressing/bathroom;Assistance with cooking/housework;Assist for transportation;Help with stairs or ramp for entrance   Equipment Recommendations  None recommended by OT    Recommendations for Other Services      Precautions / Restrictions Precautions Precautions: Fall Precaution Comments: JP drain on Lt       Mobility Bed Mobility               General bed mobility comments: oob on arrival    Transfers Overall transfer level: Needs assistance   Transfers: Sit to/from Stand Sit to Stand: Supervision           General transfer comment: increased time. pt sitting with reclined position for abdomen     Balance Overall balance assessment: Mild deficits observed, not formally tested                                         ADL either performed or assessed with clinical judgement   ADL Overall ADL's : Needs assistance/impaired                     Lower Body Dressing: Supervision/safety;Sit to/from stand Lower Body Dressing Details (indicate  cue type and reason): able to figure 4 cross at this time. does plan to purchase a reacher for home Toilet Transfer: Supervision/safety;Ambulation;Regular Social worker and Hygiene: Supervision/safety Toileting - Clothing Manipulation Details (indicate cue type and reason): educated on bidge attachments as pt is aware of toilet tongs     Functional mobility during ADLs: Supervision/safety General ADL Comments: pt currently 6 days without bowel movement and completed ambulation during session to assess oxygen and help with bowel stimulation. pt noted to have HR 120 with movement and dropped 02 if attmepting to walk and talk. pt needed to talk breath between words due to inability to coordinate movement with breath support. pt did better with pulsed lip breathing    Extremity/Trunk Assessment Upper Extremity Assessment Upper Extremity Assessment: Overall WFL for tasks assessed            Vision   Vision Assessment?: No apparent visual deficits   Perception     Praxis      Cognition Arousal: Alert Behavior During Therapy: WFL for tasks assessed/performed Overall Cognitive Status: Within Functional Limits for tasks assessed  Exercises      Shoulder Instructions       General Comments RA with O2-93 sitting. pt dropped to 85% trying to talk and walk. pt with education cues holding 91% with good wave form    Pertinent Vitals/ Pain       Pain Assessment Pain Assessment: Faces Faces Pain Scale: Hurts a little bit Pain Location: abdomen Pain Descriptors / Indicators: Discomfort, Grimacing, Guarding Pain Intervention(s): Monitored during session, Repositioned  Home Living                                          Prior Functioning/Environment              Frequency  Min 1X/week        Progress Toward Goals  OT Goals(current goals can now be found in the care  plan section)  Progress towards OT goals: Progressing toward goals  Acute Rehab OT Goals Patient Stated Goal: to be able to go home soon. i didnt expect to be here this long OT Goal Formulation: With patient Time For Goal Achievement: 04/11/23 Potential to Achieve Goals: Good ADL Goals Pt Will Perform Grooming: with modified independence;standing Pt Will Perform Lower Body Bathing: with modified independence;with adaptive equipment;sit to/from stand Pt Will Perform Lower Body Dressing: with modified independence;sit to/from stand;with adaptive equipment Pt Will Transfer to Toilet: with modified independence;ambulating Pt Will Perform Toileting - Clothing Manipulation and hygiene: with modified independence;with adaptive equipment;sitting/lateral leans Additional ADL Goal #1: Pt will verbalize 3 energy conservation stratgies to implement during ADL routine with no cues  Plan      Co-evaluation                 AM-PAC OT "6 Clicks" Daily Activity     Outcome Measure   Help from another person eating meals?: None Help from another person taking care of personal grooming?: None Help from another person toileting, which includes using toliet, bedpan, or urinal?: None Help from another person bathing (including washing, rinsing, drying)?: None Help from another person to put on and taking off regular upper body clothing?: None Help from another person to put on and taking off regular lower body clothing?: None 6 Click Score: 24    End of Session    OT Visit Diagnosis: Unsteadiness on feet (R26.81);Muscle weakness (generalized) (M62.81)   Activity Tolerance Patient tolerated treatment well   Patient Left in chair;with call bell/phone within reach;with family/visitor present   Nurse Communication Mobility status;Precautions        Time: 6213-0865 OT Time Calculation (min): 23 min  Charges: OT General Charges $OT Visit: 1 Visit OT Treatments $Self Care/Home Management  : 8-22 mins   Brynn, OTR/L  Acute Rehabilitation Services Office: [phone] .   Mateo Flow 03/30/2023, 2:22 PM

## 2023-03-30 NOTE — Progress Notes (Signed)
Physical Therapy Treatment Patient Details Name: Alexis Lindsey MRN: [id number] DOB: Jul 18, 1981 Today's Date: 03/30/2023   History of Present Illness Patient is 42 y.o. female who underwent CT imaging for evaluation of an acute LE DVT. This incidentally showed a 2cm mass in the tail of the pancreas. EUS with FNA showed a low grade neuroendocrine tumor. There was no evidence of metastatic disease on imaging. Pt now s/p laparoscopic distal pancreatectomy with splenectomy, and colorrhaphy on 03/25/23.    PT Comments  Patient progressing mobility this session no UE support and moving well for supine<>sit on EOB.  She declined to practice stairs today due to nausea though RN medicated prior to mobility.  She will benefit from continued skilled PT till able to negotiate steps for safety with home entry.  PT will follow, but no follow up PT needs at d/c.     If plan is discharge home, recommend the following: Assistance with cooking/housework;Assist for transportation;Help with stairs or ramp for entrance   Can travel by private vehicle        Equipment Recommendations  None recommended by PT    Recommendations for Other Services       Precautions / Restrictions Precautions Precautions: Fall Precaution Comments: JP drain on Lt     Mobility  Bed Mobility Overal bed mobility: Modified Independent             General bed mobility comments: to supine after toileting then back up to sit without assist    Transfers Overall transfer level: Needs assistance Equipment used: None Transfers: Sit to/from Stand Sit to Stand: Supervision           General transfer comment: with mild flexion due to abdominal pain    Ambulation/Gait Ambulation/Gait assistance: Supervision Gait Distance (Feet): 300 Feet Assistive device: None Gait Pattern/deviations: Step-to pattern, Step-through pattern, Trunk flexed, Decreased stride length       General Gait Details: mild trunk flexion, not  using UE support during ambulation, HR up to 126, SpO2 88% with ambulation on RA   Stairs             Wheelchair Mobility     Tilt Bed    Modified Rankin (Stroke Patients Only)       Balance Overall balance assessment: Needs assistance   Sitting balance-Leahy Scale: Good       Standing balance-Leahy Scale: Good                 High Level Balance Comments: toileting in bathroom on her own            Cognition Arousal: Alert Behavior During Therapy: WFL for tasks assessed/performed Overall Cognitive Status: Within Functional Limits for tasks assessed                                          Exercises      General Comments General comments (skin integrity, edema, etc.): SpO2 91% on RA at rest after ambulation      Pertinent Vitals/Pain Pain Assessment Faces Pain Scale: Hurts a little bit Pain Location: abdomen Pain Descriptors / Indicators: Guarding Pain Intervention(s): Monitored during session    Home Living                          Prior Function  PT Goals (current goals can now be found in the care plan section) Progress towards PT goals: Progressing toward goals    Frequency    Min 1X/week      PT Plan      Co-evaluation              AM-PAC PT "6 Clicks" Mobility   Outcome Measure  Help needed turning from your back to your side while in a flat bed without using bedrails?: None Help needed moving from lying on your back to sitting on the side of a flat bed without using bedrails?: None Help needed moving to and from a bed to a chair (including a wheelchair)?: None Help needed standing up from a chair using your arms (e.g., wheelchair or bedside chair)?: A Little Help needed to walk in hospital room?: A Little Help needed climbing 3-5 steps with a railing? : A Little 6 Click Score: 21    End of Session   Activity Tolerance: Patient tolerated treatment well Patient left: with  call bell/phone within reach;in bed   PT Visit Diagnosis: Muscle weakness (generalized) (M62.81);Difficulty in walking, not elsewhere classified (R26.2);Pain Pain - part of body:  (abdomen)     Time: 2841-3244 PT Time Calculation (min) (ACUTE ONLY): 15 min  Charges:    $Gait Training: 8-22 mins PT General Charges $$ ACUTE PT VISIT: 1 Visit                     Sheran Lawless, PT Acute Rehabilitation Services Office:[phone] 03/30/2023    Alexis Lindsey 03/30/2023, 3:51 PM

## 2023-03-31 ENCOUNTER — Other Ambulatory Visit: Payer: Self-pay | Admitting: Surgery

## 2023-03-31 LAB — BPAM RBC
Blood Product Expiration Date: 202409172359
Blood Product Expiration Date: 202409172359
Unit Type and Rh: 6200
Unit Type and Rh: 6200

## 2023-03-31 LAB — TYPE AND SCREEN
ABO/RH(D): A POS
Antibody Screen: NEGATIVE
Unit division: 0
Unit division: 0

## 2023-03-31 LAB — GLUCOSE, CAPILLARY
Glucose-Capillary: 83 mg/dL (ref 70–99)
Glucose-Capillary: 93 mg/dL (ref 70–99)

## 2023-03-31 LAB — BASIC METABOLIC PANEL
Anion gap: 8 (ref 5–15)
BUN: 7 mg/dL (ref 6–20)
CO2: 24 mmol/L (ref 22–32)
Calcium: 7.7 mg/dL — ABNORMAL LOW (ref 8.9–10.3)
Chloride: 100 mmol/L (ref 98–111)
Creatinine, Ser: 0.71 mg/dL (ref 0.44–1.00)
GFR, Estimated: >60 mL/min (ref >60)
Glucose, Bld: 94 mg/dL (ref 70–99)
Potassium: 3.9 mmol/L (ref 3.5–5.1)
Sodium: 132 mmol/L — ABNORMAL LOW (ref 135–145)

## 2023-03-31 LAB — CBC
HCT: 27.7 % — ABNORMAL LOW (ref 36.0–46.0)
Hemoglobin: 9.1 g/dL — ABNORMAL LOW (ref 12.0–15.0)
MCH: 29.1 pg (ref 26.0–34.0)
MCHC: 32.9 g/dL (ref 30.0–36.0)
MCV: 88.5 fL (ref 80.0–100.0)
Platelets: 467 10*3/uL — ABNORMAL HIGH (ref 150–400)
RBC: 3.13 MIL/uL — ABNORMAL LOW (ref 3.87–5.11)
RDW: 13.8 % (ref 11.5–15.5)
WBC: 22.1 10*3/uL — ABNORMAL HIGH (ref 4.0–10.5)
nRBC: 0.5 % — ABNORMAL HIGH (ref 0.0–0.2)

## 2023-03-31 MED ORDER — METHOCARBAMOL 1000 MG PO TABS: 1000.0000 mg | ORAL_TABLET | Freq: Four times a day (QID) | ORAL | 0 refills | Status: DC | PRN

## 2023-03-31 MED ORDER — MENINGOCOCCAL A C Y&W-135 OLIG IM SOLN
0.5000 mL | INTRAMUSCULAR | Status: AC | PRN
  Administered 2023-03-31: 0.5 mL via INTRAMUSCULAR
  Filled 2023-03-31 (×2): qty 0.5

## 2023-03-31 MED ORDER — POLYETHYLENE GLYCOL 3350 17 G PO PACK: 17.0000 g | PACK | Freq: Every day | ORAL | Status: DC | PRN

## 2023-03-31 MED ORDER — DOCUSATE SODIUM 100 MG PO CAPS: 100.0000 mg | ORAL_CAPSULE | Freq: Two times a day (BID) | ORAL | Status: DC

## 2023-03-31 MED ORDER — MENINGOCOCCAL VAC B (OMV) IM SUSY
0.5000 mL | PREFILLED_SYRINGE | INTRAMUSCULAR | Status: AC | PRN
  Administered 2023-03-31: 0.5 mL via INTRAMUSCULAR
  Filled 2023-03-31: qty 0.5

## 2023-03-31 MED ORDER — HAEMOPHILUS B POLYSAC CONJ VAC IM SOLR
0.5000 mL | INTRAMUSCULAR | Status: AC | PRN
  Administered 2023-03-31: 0.5 mL via INTRAMUSCULAR
  Filled 2023-03-31: qty 0.5

## 2023-03-31 MED ORDER — PNEUMOCOCCAL 20-VAL CONJ VACC 0.5 ML IM SUSY
0.5000 mL | PREFILLED_SYRINGE | INTRAMUSCULAR | Status: AC | PRN
  Administered 2023-03-31: 0.5 mL via INTRAMUSCULAR
  Filled 2023-03-31: qty 0.5

## 2023-03-31 MED ORDER — OXYCODONE HCL 10 MG PO TABS: 10.0000 mg | ORAL_TABLET | ORAL | 0 refills | Status: AC | PRN

## 2023-03-31 NOTE — Discharge Instructions (Addendum)
[facility] [place] DISCHARGE INSTRUCTIONS: DISTAL PANCREATECTOMY AND SPLENECTOMY  Activity No heavy lifting greater than 15 pounds for 8 weeks after surgery. Ok to shower, but do not bathe or submerge incision underwater. Do not drive until cleared by your surgeon. Be sure to walk around your home at least 3 times daily. This will help avoid blood clots and will improve your strength.  Wound Care Your incision is covered with skin glue called Dermabond. This will peel off on its own over time. You may shower and allow warm soapy water to run over your incision. Gently pat dry. Do not submerge your incision underwater for at least 2 weeks. Monitor your incision for any new redness, tenderness, or drainage. Many patients will experience some swelling and bruising at the incisions.  Ice packs will help.  Swelling and bruising can take several days to resolve.   [facility] DRAIN CARE INSTRUCTIONS Wash your hands prior to caring for your drain. Uncap the bulb to release the suction. Pour out the bulb contents into a measuring cup and record the amount. Squeeze the bulb and replace the cap to apply suction again. You should empty your drain each time you notice the bulb is full. Empty at least once a day and more often when the bulb fills up.  Please keep a daily log of your drain output and bring this with you when you come to clinic.   Medications A  prescription for pain medication may be given to you upon discharge.  Take your pain medication as prescribed, if needed.  If narcotic pain medicine is not needed, then you may take acetaminophen (Tylenol) as needed. Do not take more than 4000mg  Tylenol in a single 24-hour period. It is common to experience some constipation if taking pain medication after surgery.  Increasing fluid intake and taking a stool softener (such as Colace) will usually help or prevent this problem from occurring.  A mild laxative (Milk of Magnesia or Miralax) should be  taken according to package directions if there are no bowel movements after 48 hours. Take your usually prescribed medications unless otherwise directed. If you need a refill on your pain medication, please contact your pharmacy.  They will contact our office to request authorization. Prescriptions will not be filled after 5 pm or on weekends. You should resume taking your Xarelto as usual this evening (September 3).  Diet You may notice that you feel full early after meals, or have occasional nausea or decreased appetite. This is common following pancreas surgery and will improve with time. It is better to consume small frequent meals throughout the day rather than 3 large meals. Avoid drinking a lot of carbonated beverages, as these can worsen symptoms of nausea and reflux. If you are having frequent vomiting after meals or are unable to keep down any food or drink, please call the office right away.  When to Call us: Fever greater than 100.5 New redness, drainage, or swelling at incision site Severe pain, nausea, or vomiting Excessive vomiting or inability to keep down any liquids Jaundice (yellowing of the whites of the eyes or skin)  Follow-up - You have been scheduled for an appointment for a drain check this Friday at 9:00am at the [facility] office. Please bring a log of your daily drain output with you to the appointment. - You have an appointment scheduled with Dr. Freida Busman on April 14, 2023 at 10:50am. This will be at the [facility] office at [address], [address], [place], [place]. Please arrive at least 15 minutes prior to your scheduled appointment time.  IF YOU HAVE DISABILITY OR FAMILY LEAVE FORMS, YOU MUST BRING THEM TO THE OFFICE FOR PROCESSING.   DO NOT GIVE THEM TO YOUR DOCTOR.  The clinic staff is available to answer your questions during regular business hours.  Please don't hesitate to call and ask to speak to one of the nurses for  clinical concerns.  If you have a medical emergency, go to the nearest emergency room or call 911.  A surgeon from [facility] is always on call at the hospital  [address], [place], [place]  [address] ?  [address] [address], [place], [place]   [address] [phone] ? Toll Free: [phone] ? FAX [phone] Web site: [email or web address]      Managing Your Pain After Surgery Without Opioids    Thank you for participating in our program to help patients manage their pain after surgery without opioids. This is part of our effort to provide you with the best care possible, without exposing you or your family to the risk that opioids pose.  What pain can I expect after surgery? You can expect to have some pain after surgery. This is normal. The pain is typically worse the day after surgery, and quickly begins to get better. Many studies have found that many patients are able to manage their pain after surgery with Over-the-Counter (OTC) medications such as Tylenol and Motrin. If you have a condition that does not allow you to take Tylenol or Motrin, notify your surgical team.  How will I manage my pain? The best strategy for controlling your pain after surgery is around the clock pain control with Tylenol (acetaminophen) and Motrin (ibuprofen or Advil). Alternating these medications with each other allows you to maximize your pain control. In addition to Tylenol and Motrin, you can use heating pads or ice packs on your incisions to help reduce your pain.  How will I alternate your regular strength over-the-counter pain medication? You will take a dose of pain medication every three hours. Start by taking 650 mg of Tylenol (2 pills of 325 mg) 3 hours later take 600 mg of Motrin (3 pills of 200 mg) 3 hours after taking the Motrin take 650 mg of Tylenol 3 hours after that take 600 mg of Motrin.   - 1 -  See example - if your first dose of Tylenol is  at 12:00 PM   12:00 PM Tylenol 650 mg (2 pills of 325 mg)  3:00 PM Motrin 600 mg (3 pills of 200 mg)  6:00 PM Tylenol 650 mg (2 pills of 325 mg)  9:00 PM Motrin 600 mg (3 pills of 200 mg)  Continue alternating every 3 hours   We recommend that you follow this schedule around-the-clock for at least 3 days after surgery, or until you feel that it is no longer needed. Use the table on the last page of this handout to keep track of the medications you are taking. Important: Do not take more than 3000mg  of Tylenol or 3200mg  of Motrin in a 24-hour period. Do not take ibuprofen/Motrin if you have a history of bleeding stomach ulcers, severe kidney disease, &/or actively taking a blood thinner  What if I still have pain? If you have pain that is not controlled with the over-the-counter pain medications (Tylenol and Motrin or Advil) you might have what we call "breakthrough" pain. You  will receive a prescription for a small amount of an opioid pain medication such as Oxycodone, Tramadol, or Tylenol with Codeine. Use these opioid pills in the first 24 hours after surgery if you have breakthrough pain. Do not take more than 1 pill every 4-6 hours.  If you still have uncontrolled pain after using all opioid pills, don't hesitate to call our staff using the number provided. We will help make sure you are managing your pain in the best way possible, and if necessary, we can provide a prescription for additional pain medication.   Day 1    Time  Name of Medication Number of pills taken  Amount of Acetaminophen  Pain Level   Comments  AM PM       AM PM       AM PM       AM PM       AM PM       AM PM       AM PM       AM PM       Total Daily amount of Acetaminophen Do not take more than  3,000 mg per day      Day 2    Time  Name of Medication Number of pills taken  Amount of Acetaminophen  Pain Level   Comments  AM PM       AM PM       AM PM       AM PM       AM PM       AM PM        AM PM       AM PM       Total Daily amount of Acetaminophen Do not take more than  3,000 mg per day      Day 3    Time  Name of Medication Number of pills taken  Amount of Acetaminophen  Pain Level   Comments  AM PM       AM PM       AM PM       AM PM         AM PM       AM PM       AM PM       AM PM       Total Daily amount of Acetaminophen Do not take more than  3,000 mg per day      Day 4    Time  Name of Medication Number of pills taken  Amount of Acetaminophen  Pain Level   Comments  AM PM       AM PM       AM PM       AM PM       AM PM       AM PM       AM PM       AM PM       Total Daily amount of Acetaminophen Do not take more than  3,000 mg per day      Day 5    Time  Name of Medication Number of pills taken  Amount of Acetaminophen  Pain Level   Comments  AM PM       AM PM       AM PM       AM PM       AM PM  AM PM       AM PM       AM PM       Total Daily amount of Acetaminophen Do not take more than  3,000 mg per day      Day 6    Time  Name of Medication Number of pills taken  Amount of Acetaminophen  Pain Level  Comments  AM PM       AM PM       AM PM       AM PM       AM PM       AM PM       AM PM       AM PM       Total Daily amount of Acetaminophen Do not take more than  3,000 mg per day      Day 7    Time  Name of Medication Number of pills taken  Amount of Acetaminophen  Pain Level   Comments  AM PM       AM PM       AM PM       AM PM       AM PM       AM PM       AM PM       AM PM       Total Daily amount of Acetaminophen Do not take more than  3,000 mg per day        For additional information about how and where to safely dispose of unused opioid medications - [email or web address]  Disclaimer: This document contains information and/or instructional materials adapted from [facility] for the typical patient with your condition. It does not replace medical  advice from your health care provider because your experience may differ from that of the typical patient. Talk to your health care provider if you have any questions about this document, your condition or your treatment plan. Adapted from [facility]

## 2023-03-31 NOTE — TOC Transition Note (Signed)
Transition of Care (TOC) - CM/SW Discharge Note Donn Pierini RN,BSN Transitions of Care Unit 4NP (Non Trauma)- RN Case Manager See Treatment Team for direct Phone #   Patient Details  Name: Alexis Lindsey MRN: [id number] Date of Birth: [birth year]  Transition of [facility]) CM/SW Contact:  Darrold Span, RN Phone Number: 03/31/2023, 11:25 AM   Clinical Narrative:    Pt stable for transition home today, No TOC needs noted. Pt to follow up per AVS instructions.  Family to transport home.    Final next level of care: Home/Self Care Barriers to Discharge: No Barriers Identified   Patient Goals and CMS Choice   Choice offered to / list presented to : NA  Discharge Placement                 Home        Discharge Plan and Services Additional resources added to the After Visit Summary for   In-house Referral: NA Discharge Planning Services: NA Post Acute Care Choice: NA          DME Arranged: N/A DME Agency: NA       HH Arranged: NA HH Agency: NA        Social Determinants of Health (SDOH) Interventions SDOH Screenings   Social Connections: Unknown (09/24/2022)   Received from [facility]  Tobacco Use: Medium Risk (03/25/2023)     Readmission Risk Interventions    03/31/2023   11:24 AM  Readmission Risk Prevention Plan  Post Dischage Appt Complete  Medication Screening Complete  Transportation Screening Complete

## 2023-03-31 NOTE — Progress Notes (Signed)
Patient feeling much better today, denies nausea or vomiting. Tolerating diet and having bowel movements. Pain controlled. Drain amylase 3000, output remains serosanguinous and low. Will schedule for a drain check in the office this Friday, at which time the drain can likely be removed. Post-splenectomy vaccines to be given today prior to discharge.  Sophronia Simas, MD [facility] General, Hepatobiliary and Pancreatic Surgery 03/31/23 7:43 AM

## 2023-04-01 NOTE — Discharge Summary (Signed)
Physician Discharge Summary   Patient ID: Alexis Lindsey [id number] 42 y.o. 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 This was incidentally noted on a CT venogram, which was done for workup of lower extremity DVT. 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 After a discussion of the risks and benefits of surgery, she agreed to proceed with distal pancreatectomy.   Hospital Course: The patient was taken to the OR on 03/25/23 for a laparoscopic distal pancreatectomy with splenectomy. Please see separately dictated operative for further details of this procedure. Postoperative she was admitted to the progressive care unit in stable condition. Foley was removed on POD1 and she was able to void spontaneously. She was started on a clear liquid diet and slowly advanced to a soft diet. She was started on therapeutic anticoagulation (Lovenox) 24 hours after surgery given her history of unprovoked DVT. She initially had some nausea and vomiting, which improved with antiemetics and return of bowel function. She worked with physical therapy and progressed to independent ambulation. She was given a multimodal regimen for pain control. 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 Her drain amylase on POD3 was elevated at 3100, and he drain was left in place at the time of discharge. On the morning of POD6, the patient was ambulating, tolerating a regular diet without nausea, having bowel function, and her pain was  controlled on oral medications. She was examined and deemed appropriate for discharge home. Her drain is to remain in place at discharge and she is to follow up later this week for a drain check.  Consults: None  Significant Diagnostic Studies:  Surgical Pathology: A. PANCREAS, DISTAL, PANCREATECTOMY: Well-differentiated pancreatic neuroendocrine tumor, 2.8 cm, G1 (pT2) Tumor extends into peripancreatic connective tissue Tumor focally 1 mm from soft tissue resection margin Three lymph nodes negative for metastatic neuroendocrine tumor (0/3)(pN0) See oncology table  B. SPLEEN, SPLENECTOMY: Benign spleen Negative for neuroendocrine tumor  ONCOLOGY TABLE: PANCREAS NEUROENDOCRINE TUMOR: Resection Procedure: Distal pancreatectomy and splenectomy Tumor Site: Distal pancreas Tumor Size: 2.8 x 2.3 x 2 cm Tumor Focality: Unifocal Histologic Type and Grade: Well-differentiated neuroendocrine tumor, G1      Mitotic Rate: Less than 1 per 10 high-power fields      Ki-67 Labeling Index: Less than 1% (see [id number]) Tumor Extension: Into peripancreatic connective tissue Lymphovascular Invasion: Not identified Margins: All margins negative for tumor Regional Lymph Nodes:      Number of Lymph Nodes with Tumor: 0      Number of Lymph Nodes Examined: 3 Distant metastasis:      Distant Site(s) Involved: Not applicable Pathologic Stage Classification (pTNM, AJCC 8th Edition): pT2, pN0   Treatments: analgesia: acetaminophen, Dilaudid, oxycodone, toradol and robaxin, therapies: PT and OT, and surgery: Laparoscopic distal pancreatectomy and splenectomy  Discharge Exam: General: resting comfortably, NAD Neuro: alert and oriented, no focal deficits Resp: normal work of breathing on room air CV: RRR Abdomen: soft, nondistended, nontender to palpation. Incisions clean and dry with no induration or drainage. LUQ JP with serosanguinous fluid. Extremities: warm and well-perfused  Disposition:  Discharge disposition: 01-Home or Self Care       Patient Instructions:  Allergies as of 03/31/2023       Reactions   Macrobid [nitrofurantoin Macrocrystal] Nausea And Vomiting   Codeine Nausea And Vomiting        Medication List     STOP taking these medications    oxyCODONE-acetaminophen 5-325 MG tablet Commonly known as: PERCOCET/ROXICET       TAKE these medications    acetaminophen 500 MG tablet Commonly known as: TYLENOL Take 1,000 mg by mouth every 6 (six) hours as needed for mild pain or moderate pain.   buPROPion 150 MG 24 hr tablet Commonly known as: WELLBUTRIN XL Take 450 mg by mouth daily.   calcium carbonate 500 MG chewable tablet Commonly known as: TUMS - dosed in mg elemental calcium Chew 1-2 tablets by mouth daily as needed for indigestion or heartburn.   clonazePAM 1 MG tablet Commonly known as: KLONOPIN Take 0.5-1 mg by mouth 2 (two) times daily as needed for anxiety.   docusate sodium 100 MG capsule Commonly known as: COLACE Take 1 capsule (100 mg total) by mouth 2 (two) times daily.   Magnesium 500 MG Caps Take 500 mg by mouth daily.   Methocarbamol 1000 MG Tabs Take 1,000 mg by mouth every 6 (six) hours as needed for muscle spasms.   ondansetron 4 MG disintegrating tablet Commonly known as: ZOFRAN-ODT Take 1 tablet (4 mg total) by mouth every 8 (eight) hours as needed.   Oxycodone HCl 10 MG Tabs Take 1 tablet (10 mg total) by mouth every 4 (four) hours as needed for up to 5 days for severe pain.   polyethylene glycol 17 g packet Commonly known as: MIRALAX / GLYCOLAX Take 17 g by mouth daily as needed for moderate constipation.   rivaroxaban 20 MG Tabs tablet Commonly known as: Xarelto Take 1 tablet (20 mg total) by mouth daily with supper.   Vyvanse 70 MG capsule Generic drug: lisdexamfetamine Take 70 mg by mouth daily.   zolpidem 10 MG tablet Commonly known as: AMBIEN Take 10 mg by mouth at bedtime.       Activity:  no driving while on analgesics and no heavy lifting for 8 weeks Diet: regular diet Wound Care: keep wound clean and dry  Follow-up on Friday, Sept 6 for a drain check. Follow up with Dr. Freida Busman on 9/17 for postop appointment.  Signed: Fritzi Mandes 04/01/2023 8:58 AM

## 2023-04-02 LAB — SURGICAL PATHOLOGY

## 2023-04-30 ENCOUNTER — Other Ambulatory Visit: Payer: Self-pay

## 2023-04-30 ENCOUNTER — Emergency Department (HOSPITAL_BASED_OUTPATIENT_CLINIC_OR_DEPARTMENT_OTHER): Payer: BC Managed Care – PPO

## 2023-04-30 ENCOUNTER — Emergency Department (HOSPITAL_BASED_OUTPATIENT_CLINIC_OR_DEPARTMENT_OTHER): Payer: BC Managed Care – PPO | Admitting: Radiology

## 2023-04-30 ENCOUNTER — Encounter (HOSPITAL_BASED_OUTPATIENT_CLINIC_OR_DEPARTMENT_OTHER): Payer: Self-pay | Admitting: Urology

## 2023-04-30 ENCOUNTER — Emergency Department (HOSPITAL_BASED_OUTPATIENT_CLINIC_OR_DEPARTMENT_OTHER)
Admission: EM | Admit: 2023-04-30 | Discharge: 2023-04-30 | Disposition: A | Discharge status: Home / Self Care | Payer: BC Managed Care – PPO | Attending: Emergency Medicine | Admitting: Emergency Medicine

## 2023-04-30 DIAGNOSIS — R0781 Pleurodynia: Secondary | ICD-10-CM | POA: Diagnosis not present

## 2023-04-30 DIAGNOSIS — R0789 Other chest pain: Secondary | ICD-10-CM | POA: Diagnosis not present

## 2023-04-30 DIAGNOSIS — R0602 Shortness of breath: Secondary | ICD-10-CM | POA: Diagnosis not present

## 2023-04-30 DIAGNOSIS — Z7901 Long term (current) use of anticoagulants: Secondary | ICD-10-CM | POA: Diagnosis not present

## 2023-04-30 DIAGNOSIS — R0600 Dyspnea, unspecified: Secondary | ICD-10-CM | POA: Insufficient documentation

## 2023-04-30 DIAGNOSIS — R5383 Other fatigue: Secondary | ICD-10-CM | POA: Diagnosis not present

## 2023-04-30 DIAGNOSIS — R42 Dizziness and giddiness: Secondary | ICD-10-CM | POA: Diagnosis not present

## 2023-04-30 LAB — BASIC METABOLIC PANEL
Anion gap: 7 (ref 5–15)
BUN: 19 mg/dL (ref 6–20)
CO2: 26 mmol/L (ref 22–32)
Calcium: 9.6 mg/dL (ref 8.9–10.3)
Chloride: 103 mmol/L (ref 98–111)
Creatinine, Ser: 0.97 mg/dL (ref 0.44–1.00)
GFR, Estimated: >60 mL/min (ref >60)
Glucose, Bld: 95 mg/dL (ref 70–99)
Potassium: 4.4 mmol/L (ref 3.5–5.1)
Sodium: 136 mmol/L (ref 135–145)

## 2023-04-30 LAB — CBC
HCT: 37.8 % (ref 36.0–46.0)
Hemoglobin: 12 g/dL (ref 12.0–15.0)
MCH: 28.1 pg (ref 26.0–34.0)
MCHC: 31.7 g/dL (ref 30.0–36.0)
MCV: 88.5 fL (ref 80.0–100.0)
Platelets: 633 10*3/uL — ABNORMAL HIGH (ref 150–400)
RBC: 4.27 MIL/uL (ref 3.87–5.11)
RDW: 15.2 % (ref 11.5–15.5)
WBC: 9.1 10*3/uL (ref 4.0–10.5)
nRBC: 0 % (ref 0.0–0.2)

## 2023-04-30 LAB — TROPONIN I (HIGH SENSITIVITY)
Troponin I (High Sensitivity): <2 ng/L (ref <18)
Troponin I (High Sensitivity): <2 ng/L (ref <18)

## 2023-04-30 LAB — PREGNANCY, URINE: Preg Test, Ur: NEGATIVE

## 2023-04-30 MED ORDER — IOHEXOL 350 MG/ML SOLN
100.0000 mL | Freq: Once | INTRAVENOUS | Status: AC | PRN
  Administered 2023-04-30: 75 mL via INTRAVENOUS

## 2023-04-30 NOTE — ED Notes (Signed)
Pt discharged home and given discharge paperwork. Opportunities given for questions. Pt verbalizes understanding. PIV removed x1. Stone,Heather R , RN 

## 2023-04-30 NOTE — Discharge Instructions (Addendum)
The test today in the ED did not show any signs of blood clot or pneumonia.  No signs of any heart trouble.  Follow-up with your doctor to be rechecked if the shortness of breath persists.  Return to the ER for any worsening symptoms

## 2023-04-30 NOTE — ED Provider Notes (Signed)
[place] EMERGENCY DEPARTMENT AT [facility] Provider Note   CSN: [id number] Arrival date & time: 04/30/23  5784     History  Chief Complaint  Patient presents with   Shortness of Breath    Alexis Lindsey is a 42 y.o. female.   Shortness of Breath    Patient has a history of multifocal pneumonia, DVT, pancreatic mass who had surgery for pancreatic mass performed on August 28.  Patient is on anticoagulation for DVT.  She has been compliant with her medications but did have to stop them during her surgical procedure.  Patient states she has had trouble with feeling more short of breath over the last week.  She is also had some intermittent discomfort in the left lower rib area and chest heaviness.  Patient called her surgeon to see if this was expected after surgery.  She was instructed to come to the ED for evaluation.  Patient has not noticed any leg swelling.  No fevers or coughing.  No blood in her stool  Home Medications Prior to Admission medications   Medication Sig Start Date End Date Taking? Authorizing Provider  acetaminophen (TYLENOL) 500 MG tablet Take 1,000 mg by mouth every 6 (six) hours as needed for mild pain or moderate pain.    [provider]  buPROPion (WELLBUTRIN XL) 150 MG 24 hr tablet Take 450 mg by mouth daily.    [provider]  calcium carbonate (TUMS - DOSED IN MG ELEMENTAL CALCIUM) 500 MG chewable tablet Chew 1-2 tablets by mouth daily as needed for indigestion or heartburn.    [provider]  clonazePAM (KLONOPIN) 1 MG tablet Take 0.5-1 mg by mouth 2 (two) times daily as needed for anxiety.    [provider]  docusate sodium (COLACE) 100 MG capsule Take 1 capsule (100 mg total) by mouth 2 (two) times daily. 03/31/23   Fritzi Mandes, MD  Magnesium 500 MG CAPS Take 500 mg by mouth daily.    [provider]  methocarbamol 1000 MG TABS Take 1,000 mg by mouth every 6 (six) hours as needed for muscle  spasms. 03/31/23   Fritzi Mandes, MD  ondansetron (ZOFRAN-ODT) 4 MG disintegrating tablet Take 1 tablet (4 mg total) by mouth every 8 (eight) hours as needed. 12/15/22   Mardene Sayer, MD  polyethylene glycol (MIRALAX / GLYCOLAX) 17 g packet Take 17 g by mouth daily as needed for moderate constipation. 03/31/23   Fritzi Mandes, MD  rivaroxaban (XARELTO) 20 MG TABS tablet Take 1 tablet (20 mg total) by mouth daily with supper. 01/28/23   Mansouraty, Netty Starring., MD  VYVANSE 70 MG capsule Take 70 mg by mouth daily. 07/30/16   [provider]  zolpidem (AMBIEN) 10 MG tablet Take 10 mg by mouth at bedtime.    [provider]      Allergies    Macrobid [nitrofurantoin macrocrystal], Robaxin [methocarbamol], and Codeine    Review of Systems   Review of Systems  Respiratory:  Positive for shortness of breath.     Physical Exam Updated Vital Signs BP 113/82   Pulse 86   Temp 97.6 F (36.4 C) (Oral)   Resp (!) 23   Ht 1.753 m (5\' 9" )   Wt 78 kg   LMP 01/26/2020 (Exact Date)   SpO2 100%   BMI 25.39 kg/m  Physical Exam Vitals and nursing note reviewed.  Constitutional:      General: She is not in acute  distress.    Appearance: She is well-developed.  HENT:     Head: Normocephalic and atraumatic.     Right Ear: External ear normal.     Left Ear: External ear normal.  Eyes:     General: No scleral icterus.       Right eye: No discharge.        Left eye: No discharge.     Conjunctiva/sclera: Conjunctivae normal.  Neck:     Trachea: No tracheal deviation.  Cardiovascular:     Rate and Rhythm: Normal rate and regular rhythm.  Pulmonary:     Effort: Pulmonary effort is normal. No respiratory distress.     Breath sounds: Normal breath sounds. No stridor. No wheezing or rales.  Abdominal:     General: Bowel sounds are normal. There is no distension.     Palpations: Abdomen is soft.     Tenderness: There is no abdominal tenderness. There is no guarding or rebound.   Musculoskeletal:        General: No tenderness or deformity.     Cervical back: Neck supple.  Skin:    General: Skin is warm and dry.     Findings: No rash.  Neurological:     General: No focal deficit present.     Mental Status: She is alert.     Cranial Nerves: No cranial nerve deficit, dysarthria or facial asymmetry.     Sensory: No sensory deficit.     Motor: No abnormal muscle tone or seizure activity.     Coordination: Coordination normal.  Psychiatric:        Mood and Affect: Mood normal.     ED Results / Procedures / Treatments   Labs (all labs ordered are listed, but only abnormal results are displayed) Labs Reviewed  CBC - Abnormal; Notable for the following components:      Result Value   Platelets 633 (*)    All other components within normal limits  BASIC METABOLIC PANEL  PREGNANCY, URINE  TROPONIN I (HIGH SENSITIVITY)  TROPONIN I (HIGH SENSITIVITY)    EKG EKG Interpretation Date/Time:  Thursday April 30 2023 10:08:08 EDT Ventricular Rate:  89 PR Interval:  121 QRS Duration:  77 QT Interval:  361 QTC Calculation: 440 R Axis:   73  Text Interpretation: Sinus rhythm RSR' in V1 or V2, probably normal variant No significant change since last tracing Confirmed by Linwood Dibbles [phone]) on 04/30/2023 10:19:16 AM  Radiology CT Angio Chest PE W and/or Wo Contrast  Result Date: 04/30/2023 CLINICAL DATA:  Pulmonary embolism (PE) suspected, high prob. Shortness of breath. Pain in left lower rib area and chest heaviness. EXAM: CT ANGIOGRAPHY CHEST WITH CONTRAST TECHNIQUE: Multidetector CT imaging of the chest was performed using the standard protocol during bolus administration of intravenous contrast. Multiplanar CT image reconstructions and MIPs were obtained to evaluate the vascular anatomy. RADIATION DOSE REDUCTION: This exam was performed according to the departmental dose-optimization program which includes automated exposure control, adjustment of the mA and/or  kV according to patient size and/or use of iterative reconstruction technique. CONTRAST:  75mL OMNIPAQUE IOHEXOL 350 MG/ML SOLN COMPARISON:  CT angiography chest from 11/15/2018. FINDINGS: Cardiovascular: No evidence of embolism to the proximal subsegmental pulmonary artery level. Normal cardiac size. No pericardial effusion. No aortic aneurysm. Mediastinum/Nodes: Visualized thyroid gland appears grossly unremarkable. No solid / cystic mediastinal masses. The esophagus is nondistended precluding optimal assessment. No axillary, mediastinal or hilar lymphadenopathy by size criteria. Lungs/Pleura: The central tracheo-bronchial tree  is patent. No mass or consolidation. No pleural effusion or pneumothorax. No suspicious lung nodules. Upper Abdomen: Since the prior study, patient underwent distal pancreatectomy and splenectomy. There is fat stranding and small amount of fluid/facial thickening in the left upper quadrant. Remaining visualized upper abdominal viscera within normal limits. Musculoskeletal: Bilateral breast implants noted. The visualized soft tissues of the chest wall are grossly unremarkable. No suspicious osseous lesions. Review of the MIP images confirms the above findings. IMPRESSION: 1. No pulmonary embolism. 2. Postsurgical changes in the left upper quadrant status post distal pancreatectomy and splenectomy. Electronically Signed   By: Jules Schick M.D.   On: 04/30/2023 14:18   DG Chest 2 View  Result Date: 04/30/2023 CLINICAL DATA:  Shortness of breath.  Fatigue.  Dizziness. EXAM: CHEST - 2 VIEW COMPARISON:  Chest radiographs 01/26/2023 and 11/15/2018; CT chest 11/23/2018 FINDINGS: Cardiac silhouette and mediastinal contours are within normal limits. The lungs are clear. No pleural effusion or pneumothorax. Mild-to-moderate multilevel anterior disc space narrowing. IMPRESSION: No active cardiopulmonary disease. Electronically Signed   By: Neita Garnet M.D.   On: 04/30/2023 11:05     Procedures Procedures    Medications Ordered in ED Medications  iohexol (OMNIPAQUE) 350 MG/ML injection 100 mL (75 mLs Intravenous Contrast Given 04/30/23 1153)    ED Course/ Medical Decision Making/ A&P Clinical Course as of 04/30/23 1435  Thu Apr 30, 2023  1146 Chest x-ray without acute findings.  CBC metabolic panel troponin normal [JK]  1426 CT scan does not show any acute abnormality.  Postsurgical changes noted.  No acute PE. [JK]    Clinical Course User Index [JK] Linwood Dibbles, MD                                 Medical Decision Making Frontal diagnosis includes but not limited to anemia, pneumonia, pneumothorax, pulmonary embolism, ACS  Problems Addressed: Dyspnea, unspecified type: acute illness or injury that poses a threat to life or bodily functions  Amount and/or Complexity of Data Reviewed Labs: ordered. Decision-making details documented in ED Course. Radiology: ordered and independent interpretation performed.  Risk Prescription drug management.   Patient presented to ED for evaluation of shortness of breath.  ED workup reassuring.  No signs of pneumonia or pneumothorax on x-ray.  No signs of cardiac ischemia.  With her history of having to stop her anticoagulants recently for her surgery I was concerned about the possibility of pulmonary embolism.  CT angiogram was performed and fortunately no signs of PE.  Etiology of shortness of breath unclear but reassuring workup in the ED.  Patient does not have an oxygen requirement.  Her lungs are clear.  She is not tachypneic.  Her tests have been reassuring.  Will have her follow-up with her doctor next week to be rechecked.  Warning signs precautions discussed        Final Clinical Impression(s) / ED Diagnoses Final diagnoses:  Dyspnea, unspecified type    Rx / DC Orders ED Discharge Orders     None         Linwood Dibbles, MD 04/30/23 1435

## 2023-04-30 NOTE — ED Triage Notes (Signed)
SOB over past week., pain in left lower rib area and chest heaviness  Denies fever, Denies cough   H/o DVT in arm and leg, takes xarelto  Post op Pancreatic CA surgery in 02/2023 Trisha RT at bedside

## 2023-05-21 DIAGNOSIS — Z0000 Encounter for general adult medical examination without abnormal findings: Secondary | ICD-10-CM | POA: Diagnosis not present

## 2023-05-21 DIAGNOSIS — F5101 Primary insomnia: Secondary | ICD-10-CM | POA: Diagnosis not present

## 2023-05-21 DIAGNOSIS — F410 Panic disorder [episodic paroxysmal anxiety] without agoraphobia: Secondary | ICD-10-CM | POA: Diagnosis not present

## 2023-05-21 DIAGNOSIS — F900 Attention-deficit hyperactivity disorder, predominantly inattentive type: Secondary | ICD-10-CM | POA: Diagnosis not present

## 2023-05-21 DIAGNOSIS — R739 Hyperglycemia, unspecified: Secondary | ICD-10-CM | POA: Diagnosis not present

## 2023-05-21 DIAGNOSIS — F3342 Major depressive disorder, recurrent, in full remission: Secondary | ICD-10-CM | POA: Diagnosis not present

## 2023-05-21 DIAGNOSIS — Z23 Encounter for immunization: Secondary | ICD-10-CM | POA: Diagnosis not present

## 2023-07-01 ENCOUNTER — Other Ambulatory Visit: Payer: Self-pay | Admitting: Hematology and Oncology

## 2023-07-01 ENCOUNTER — Inpatient Hospital Stay (HOSPITAL_BASED_OUTPATIENT_CLINIC_OR_DEPARTMENT_OTHER): Payer: BC Managed Care – PPO | Admitting: Hematology and Oncology

## 2023-07-01 ENCOUNTER — Inpatient Hospital Stay: Payer: BC Managed Care – PPO | Attending: Internal Medicine

## 2023-07-01 VITALS — BP 113/81 | HR 86 | Temp 97.8°F | Resp 18 | Ht 69.0 in | Wt 164.0 lb

## 2023-07-01 DIAGNOSIS — Z7901 Long term (current) use of anticoagulants: Secondary | ICD-10-CM | POA: Insufficient documentation

## 2023-07-01 DIAGNOSIS — C7A8 Other malignant neuroendocrine tumors: Secondary | ICD-10-CM | POA: Diagnosis present

## 2023-07-01 DIAGNOSIS — Z806 Family history of leukemia: Secondary | ICD-10-CM | POA: Insufficient documentation

## 2023-07-01 DIAGNOSIS — Z0000 Encounter for general adult medical examination without abnormal findings: Secondary | ICD-10-CM

## 2023-07-01 DIAGNOSIS — K869 Disease of pancreas, unspecified: Secondary | ICD-10-CM

## 2023-07-01 DIAGNOSIS — Z86718 Personal history of other venous thrombosis and embolism: Secondary | ICD-10-CM | POA: Diagnosis not present

## 2023-07-01 LAB — CMP (CANCER CENTER ONLY)
ALT: 21 U/L (ref 0–44)
AST: 19 U/L (ref 15–41)
Albumin: 4 g/dL (ref 3.5–5.0)
Alkaline Phosphatase: 67 U/L (ref 38–126)
Anion gap: 3 — ABNORMAL LOW (ref 5–15)
BUN: 13 mg/dL (ref 6–20)
CO2: 30 mmol/L (ref 22–32)
Calcium: 9.1 mg/dL (ref 8.9–10.3)
Chloride: 105 mmol/L (ref 98–111)
Creatinine: 0.94 mg/dL (ref 0.44–1.00)
GFR, Estimated: >60 mL/min (ref >60)
Glucose, Bld: 102 mg/dL — ABNORMAL HIGH (ref 70–99)
Potassium: 4.5 mmol/L (ref 3.5–5.1)
Sodium: 138 mmol/L (ref 135–145)
Total Bilirubin: 0.5 mg/dL (ref <1.2)
Total Protein: 6.9 g/dL (ref 6.5–8.1)

## 2023-07-01 LAB — CBC WITH DIFFERENTIAL (CANCER CENTER ONLY)
Abs Immature Granulocytes: 0.01 10*3/uL (ref 0.00–0.07)
Basophils Absolute: 0.1 10*3/uL (ref 0.0–0.1)
Basophils Relative: 2 %
Eosinophils Absolute: 0.4 10*3/uL (ref 0.0–0.5)
Eosinophils Relative: 5 %
HCT: 39.3 % (ref 36.0–46.0)
Hemoglobin: 12.9 g/dL (ref 12.0–15.0)
Immature Granulocytes: 0 %
Lymphocytes Relative: 28 %
Lymphs Abs: 1.9 10*3/uL (ref 0.7–4.0)
MCH: 28.3 pg (ref 26.0–34.0)
MCHC: 32.8 g/dL (ref 30.0–36.0)
MCV: 86.2 fL (ref 80.0–100.0)
Monocytes Absolute: 0.6 10*3/uL (ref 0.1–1.0)
Monocytes Relative: 8 %
Neutro Abs: 3.9 10*3/uL (ref 1.7–7.7)
Neutrophils Relative %: 57 %
Platelet Count: 452 10*3/uL — ABNORMAL HIGH (ref 150–400)
RBC: 4.56 MIL/uL (ref 3.87–5.11)
RDW: 15.3 % (ref 11.5–15.5)
WBC Count: 6.9 10*3/uL (ref 4.0–10.5)
nRBC: 0 % (ref 0.0–0.2)
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
## 2023-07-04 LAB — CHROMOGRANIN A: Chromogranin A (ng/mL): 74.8 ng/mL (ref 0.0–101.8)

## 2023-07-26 ENCOUNTER — Other Ambulatory Visit: Payer: Self-pay | Admitting: Hematology and Oncology

## 2023-09-04 ENCOUNTER — Telehealth: Payer: Self-pay | Admitting: Physician Assistant

## 2023-09-04 NOTE — Telephone Encounter (Signed)
 Rescheduled appointments per provider on PAL. Patient is aware of the changes made and is active on MyChart.

## 2023-09-15 ENCOUNTER — Ambulatory Visit (HOSPITAL_COMMUNITY): Payer: BC Managed Care – PPO | Attending: Hematology and Oncology

## 2023-09-22 ENCOUNTER — Telehealth: Payer: Self-pay | Admitting: Hematology and Oncology

## 2023-09-23 ENCOUNTER — Inpatient Hospital Stay: Payer: BC Managed Care – PPO

## 2023-09-23 ENCOUNTER — Inpatient Hospital Stay: Payer: BC Managed Care – PPO | Admitting: Physician Assistant

## 2023-09-29 ENCOUNTER — Other Ambulatory Visit: Payer: BC Managed Care – PPO

## 2023-09-29 ENCOUNTER — Ambulatory Visit: Payer: BC Managed Care – PPO | Admitting: Hematology and Oncology

## 2023-10-07 ENCOUNTER — Inpatient Hospital Stay: Payer: BC Managed Care – PPO | Attending: Internal Medicine

## 2023-10-07 ENCOUNTER — Other Ambulatory Visit: Payer: Self-pay | Admitting: Hematology and Oncology

## 2023-10-07 ENCOUNTER — Inpatient Hospital Stay: Payer: BC Managed Care – PPO | Admitting: Hematology and Oncology

## 2023-10-07 DIAGNOSIS — K869 Disease of pancreas, unspecified: Secondary | ICD-10-CM
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
## 2023-12-03 ENCOUNTER — Other Ambulatory Visit (HOSPITAL_COMMUNITY): Payer: Self-pay

## 2023-12-03 ENCOUNTER — Other Ambulatory Visit: Payer: Self-pay | Admitting: Surgery

## 2023-12-03 DIAGNOSIS — Z9081 Acquired absence of spleen: Secondary | ICD-10-CM

## 2023-12-03 MED ORDER — BEXSERO 0.5 ML IM SUSY
0.5000 mL | PREFILLED_SYRINGE | Freq: Once | INTRAMUSCULAR | 0 refills | Status: AC
  Filled 2023-12-03: qty 0.5, 30d supply, fill #0
  Filled 2023-12-18: qty 0.5, 1d supply, fill #0

## 2023-12-03 NOTE — Addendum Note (Signed)
 Addended by: Lujean Sake on: 12/03/2023 10:29 AM   Modules accepted: Orders

## 2023-12-14 ENCOUNTER — Other Ambulatory Visit (HOSPITAL_COMMUNITY): Payer: Self-pay

## 2023-12-14 ENCOUNTER — Other Ambulatory Visit: Payer: Self-pay

## 2023-12-16 ENCOUNTER — Ambulatory Visit (HOSPITAL_COMMUNITY)

## 2023-12-17 ENCOUNTER — Telehealth: Payer: Self-pay | Admitting: *Deleted

## 2023-12-17 NOTE — Telephone Encounter (Signed)
 Received vm message from pt. She states she has had a headache for the last 2 days with some nausea. She is to have a CT scan tomorrow and wanted to talk about both. TCT patient. No answer but was able to leave vm message for her to return this call at her convenience to [phone]

## 2023-12-18 ENCOUNTER — Other Ambulatory Visit (HOSPITAL_COMMUNITY): Payer: Self-pay

## 2023-12-18 ENCOUNTER — Ambulatory Visit (HOSPITAL_COMMUNITY)
Admission: RE | Admit: 2023-12-18 | Discharge: 2023-12-18 | Disposition: A | Discharge status: Home / Self Care | Source: Ambulatory Visit | Attending: Hematology and Oncology | Admitting: Hematology and Oncology

## 2023-12-18 DIAGNOSIS — K869 Disease of pancreas, unspecified: Secondary | ICD-10-CM | POA: Diagnosis present

## 2023-12-18 MED ORDER — SODIUM CHLORIDE (PF) 0.9 % IJ SOLN
INTRAMUSCULAR | Status: AC
  Filled 2023-12-18: qty 50

## 2023-12-18 MED ORDER — IOHEXOL 300 MG/ML  SOLN
100.0000 mL | Freq: Once | INTRAMUSCULAR | Status: AC | PRN
  Administered 2023-12-18: 100 mL via INTRAVENOUS

## 2023-12-18 NOTE — Telephone Encounter (Signed)
 TCT patient and spoke with her this morning. Asked about her headache. She states it is better today. She states she has a h/o migraines but had not had one in quite awhile.  She took Tylenol  for the headache with mild relief though she does state again it is much better. Reminded pt that she should not take Ibuprofen  for headaches as she is on Xarelto . Also advised to f/u with her PCP regarding migraine management. Also advised it is ok for her to have her CT scan this afternoon and that we will schedule a follow up visit with Dr. Rosaline Coma to review the scan results. Pt voiced understanding. Scheduling message sent.

## 2024-01-01 ENCOUNTER — Inpatient Hospital Stay: Attending: Internal Medicine | Admitting: Hematology and Oncology

## 2024-01-01 ENCOUNTER — Telehealth: Payer: Self-pay | Admitting: Hematology and Oncology

## 2024-01-01 ENCOUNTER — Inpatient Hospital Stay
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
## 2024-01-08 ENCOUNTER — Inpatient Hospital Stay: Admitting: Hematology and Oncology

## 2024-01-08 ENCOUNTER — Inpatient Hospital Stay: Attending: Internal Medicine
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
## 2024-02-23 ENCOUNTER — Telehealth: Payer: Self-pay | Admitting: *Deleted

## 2024-02-23 ENCOUNTER — Other Ambulatory Visit: Payer: Self-pay | Admitting: Hematology and Oncology

## 2024-02-23 MED ORDER — RIVAROXABAN 20 MG PO TABS: 20.0000 mg | ORAL_TABLET | Freq: Every day | ORAL | 5 refills | Status: DC

## 2024-02-23 NOTE — Telephone Encounter (Signed)
 Received vm message from pt requesting refill of her Xarelto . She missed her appt last month and states she will call to reschedule this week.  Please advise on refill.

## 2024-03-15 ENCOUNTER — Ambulatory Visit: Admitting: Family Medicine

## 2024-03-15 ENCOUNTER — Encounter: Payer: Self-pay | Admitting: Family Medicine

## 2024-03-15 VITALS — BP 110/68 | HR 100 | Temp 97.6°F | Ht 69.0 in | Wt 181.0 lb

## 2024-03-15 DIAGNOSIS — Z23 Encounter for immunization: Secondary | ICD-10-CM

## 2024-03-15 DIAGNOSIS — M7918 Myalgia, other site: Secondary | ICD-10-CM

## 2024-03-15 DIAGNOSIS — E663 Overweight: Secondary | ICD-10-CM

## 2024-03-15 DIAGNOSIS — R5383 Other fatigue: Secondary | ICD-10-CM | POA: Diagnosis not present

## 2024-03-15 DIAGNOSIS — M25649 Stiffness of unspecified hand, not elsewhere classified: Secondary | ICD-10-CM

## 2024-03-15 DIAGNOSIS — Z833 Family history of diabetes mellitus: Secondary | ICD-10-CM

## 2024-03-15 DIAGNOSIS — F419 Anxiety disorder, unspecified: Secondary | ICD-10-CM

## 2024-03-15 DIAGNOSIS — Z86718 Personal history of other venous thrombosis and embolism: Secondary | ICD-10-CM | POA: Diagnosis not present

## 2024-03-15 DIAGNOSIS — F32A Depression, unspecified: Secondary | ICD-10-CM

## 2024-03-15 DIAGNOSIS — Z1159 Encounter for screening for other viral diseases: Secondary | ICD-10-CM

## 2024-03-15 DIAGNOSIS — Z9081 Acquired absence of spleen: Secondary | ICD-10-CM

## 2024-03-15 LAB — CBC WITH DIFFERENTIAL/PLATELET
Basophils Absolute: 0.1 K/uL (ref 0.0–0.1)
Basophils Relative: 0.7 % (ref 0.0–3.0)
Eosinophils Absolute: 0.3 K/uL (ref 0.0–0.7)
Eosinophils Relative: 3.5 % (ref 0.0–5.0)
HCT: 41.1 % (ref 36.0–46.0)
Hemoglobin: 13.6 g/dL (ref 12.0–15.0)
Lymphocytes Relative: 23.6 % (ref 12.0–46.0)
Lymphs Abs: 1.9 K/uL (ref 0.7–4.0)
MCHC: 33.2 g/dL (ref 30.0–36.0)
MCV: 91.4 fl (ref 78.0–100.0)
Monocytes Absolute: 0.5 K/uL (ref 0.1–1.0)
Monocytes Relative: 5.6 % (ref 3.0–12.0)
Neutro Abs: 5.4 K/uL (ref 1.4–7.7)
Neutrophils Relative %: 66.6 % (ref 43.0–77.0)
Platelets: 366 K/uL (ref 150.0–400.0)
RBC: 4.5 Mil/uL (ref 3.87–5.11)
RDW: 13.5 % (ref 11.5–15.5)
WBC: 8.1 K/uL (ref 4.0–10.5)

## 2024-03-15 LAB — HEMOGLOBIN A1C: Hgb A1c MFr Bld: 5.9 % (ref 4.6–6.5)

## 2024-03-15 LAB — COMPREHENSIVE METABOLIC PANEL WITH GFR
ALT: 21 U/L (ref 0–35)
AST: 21 U/L (ref 0–37)
Albumin: 4.3 g/dL (ref 3.5–5.2)
Alkaline Phosphatase: 58 U/L (ref 39–117)
BUN: 10 mg/dL (ref 6–23)
CO2: 28 meq/L (ref 19–32)
Calcium: 9.2 mg/dL (ref 8.4–10.5)
Chloride: 102 meq/L (ref 96–112)
Creatinine, Ser: 1.07 mg/dL (ref 0.40–1.20)
GFR: 63.12 mL/min (ref >60.00)
Glucose, Bld: 92 mg/dL (ref 70–99)
Potassium: 4.2 meq/L (ref 3.5–5.1)
Sodium: 136 meq/L (ref 135–145)
Total Bilirubin: 0.5 mg/dL (ref 0.2–1.2)
Total Protein: 7.4 g/dL (ref 6.0–8.3)

## 2024-03-15 LAB — VITAMIN D 25 HYDROXY (VIT D DEFICIENCY, FRACTURES): VITD: 26.5 ng/mL — ABNORMAL LOW (ref 30.00–100.00)

## 2024-03-15 LAB — TSH: TSH: 2.52 u[IU]/mL (ref 0.35–5.50)

## 2024-03-15 LAB — LIPID PANEL
Cholesterol: 177 mg/dL (ref 0–200)
HDL: 62 mg/dL (ref >39.00)
LDL Cholesterol: 105 mg/dL — ABNORMAL HIGH (ref 0–99)
NonHDL: 115.47
Total CHOL/HDL Ratio: 3
Triglycerides: 50 mg/dL (ref 0.0–149.0)
VLDL: 10 mg/dL (ref 0.0–40.0)

## 2024-03-15 LAB — SEDIMENTATION RATE: Sed Rate: 9 mm/h (ref 0–20)

## 2024-03-15 LAB — VITAMIN B12: Vitamin B-12: 399 pg/mL (ref 211–911)

## 2024-03-15 LAB — FERRITIN: Ferritin: 35.8 ng/mL (ref 10.0–291.0)

## 2024-03-15 LAB — T4, FREE: Free T4: 1.29 ng/dL (ref 0.60–1.60)

## 2024-03-15 LAB — C-REACTIVE PROTEIN: CRP: <1 mg/dL (ref 0.5–20.0)

## 2024-03-15 LAB — FOLATE: Folate: 16.1 ng/mL (ref >5.9)

## 2024-03-15 NOTE — Patient Instructions (Signed)
Please go downstairs for labs before you leave.
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
## 2024-03-17 LAB — ANTI-NUCLEAR AB-TITER (ANA TITER)
ANA TITER: 1:80 {titer} — ABNORMAL HIGH
ANA Titer 1: 1:40 {titer} — ABNORMAL HIGH

## 2024-03-17 LAB — ANA: Anti Nuclear Antibody (ANA): POSITIVE — AB

## 2024-03-17 LAB — HEPATITIS C ANTIBODY: Hepatitis C Ab: NONREACTIVE

## 2024-03-17 LAB — RHEUMATOID FACTOR: Rheumatoid fact SerPl-aCnc: <10 [IU]/mL (ref <14)

## 2024-03-18 ENCOUNTER — Ambulatory Visit: Payer: Self-pay | Admitting: Family Medicine

## 2024-03-18 DIAGNOSIS — M25649 Stiffness of unspecified hand, not elsewhere classified: Secondary | ICD-10-CM

## 2024-03-18 DIAGNOSIS — R768 Other specified abnormal immunological findings in serum: Secondary | ICD-10-CM

## 2024-03-18 DIAGNOSIS — R5383 Other fatigue: Secondary | ICD-10-CM

## 2024-03-18 DIAGNOSIS — M7918 Myalgia, other site: Secondary | ICD-10-CM

## 2024-03-18 NOTE — Progress Notes (Signed)
 Please let her know that overall her labs look good.  Her vitamin D  is slightly low so I recommend increasing  her over-the-counter vitamin D  3 x 1000 IUs daily.  Her LDL (the bad cholesterol) is slightly elevated so reduce foods high in saturated fat, fried foods and animal products in general.  Negative inflammatory markers and rheumatoid arthritis blood work. She has a positive ANA blood test.  This could mean an underlying autoimmune condition however, with normal inflammatory markers, this is less likely.  If she prefers to see rheumatology for the positive ANA, we can place that referral.

## 2024-03-29 ENCOUNTER — Ambulatory Visit

## 2024-03-29 ENCOUNTER — Ambulatory Visit (INDEPENDENT_AMBULATORY_CARE_PROVIDER_SITE_OTHER)

## 2024-03-29 VITALS — BP 119/76 | HR 93 | Resp 12 | Ht 65.0 in | Wt 180.0 lb

## 2024-03-29 DIAGNOSIS — M79672 Pain in left foot: Secondary | ICD-10-CM | POA: Diagnosis not present

## 2024-03-29 DIAGNOSIS — M791 Myalgia, unspecified site: Secondary | ICD-10-CM | POA: Diagnosis not present

## 2024-03-29 DIAGNOSIS — M2550 Pain in unspecified joint: Secondary | ICD-10-CM

## 2024-03-29 DIAGNOSIS — M79641 Pain in right hand: Secondary | ICD-10-CM

## 2024-03-29 DIAGNOSIS — M79671 Pain in right foot: Secondary | ICD-10-CM | POA: Diagnosis not present

## 2024-03-29 DIAGNOSIS — M6281 Muscle weakness (generalized): Secondary | ICD-10-CM

## 2024-03-29 DIAGNOSIS — R768 Other specified abnormal immunological findings in serum: Secondary | ICD-10-CM

## 2024-03-29 DIAGNOSIS — M79642 Pain in left hand: Secondary | ICD-10-CM
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
## 2024-03-31 LAB — URINALYSIS
Bilirubin Urine: NEGATIVE
Glucose, UA: NEGATIVE
Hgb urine dipstick: NEGATIVE
Ketones, ur: NEGATIVE
Leukocytes,Ua: NEGATIVE
Nitrite: NEGATIVE
Protein, ur: NEGATIVE
Specific Gravity, Urine: 1.029 (ref 1.001–1.035)
pH: 5.5 (ref 5.0–8.0)

## 2024-03-31 LAB — LUPUS ANTICOAGULANT EVAL W/ REFLEX
PTT-LA Screen: 27 s (ref <=40)
dRVVT: 31 s (ref <=45)

## 2024-03-31 LAB — CARDIOLIPIN ANTIBODIES, IGG, IGM, IGA
Anticardiolipin IgA: <2 [APL'U]/mL (ref <20.0)
Anticardiolipin IgG: <2 [GPL'U]/mL (ref <20.0)
Anticardiolipin IgM: <2 [MPL'U]/mL (ref <20.0)

## 2024-03-31 LAB — ANTI-SMITH ANTIBODY: ENA SM Ab Ser-aCnc: <1 AI

## 2024-03-31 LAB — PROTEIN / CREATININE RATIO, URINE
Creatinine, Urine: 209 mg/dL (ref 20–275)
Protein/Creat Ratio: 57 mg/g{creat} (ref 24–184)
Protein/Creatinine Ratio: 0.057 mg/mg{creat} (ref 0.024–0.184)
Total Protein, Urine: 12 mg/dL (ref 5–24)

## 2024-03-31 LAB — C3 AND C4
C3 Complement: 152 mg/dL (ref 83–193)
C4 Complement: 29 mg/dL (ref 15–57)

## 2024-03-31 LAB — ANTI-DNA ANTIBODY, DOUBLE-STRANDED: ds DNA Ab: 1 [IU]/mL

## 2024-03-31 LAB — BETA-2 GLYCOPROTEIN ANTIBODIES
Beta-2 Glyco 1 IgA: <2 U/mL (ref <20.0)
Beta-2 Glyco 1 IgM: <2 U/mL (ref <20.0)
Beta-2 Glyco I IgG: <2 U/mL (ref <20.0)

## 2024-03-31 LAB — CYCLIC CITRUL PEPTIDE ANTIBODY, IGG: Cyclic Citrullin Peptide Ab: <16 U

## 2024-03-31 LAB — CK: Total CK: 63 U/L (ref 20–239)

## 2024-04-01 ENCOUNTER — Telehealth: Payer: Self-pay | Admitting: Hematology and Oncology

## 2024-04-06 ENCOUNTER — Other Ambulatory Visit: Payer: Self-pay | Admitting: Hematology and Oncology

## 2024-04-06 ENCOUNTER — Inpatient Hospital Stay (HOSPITAL_BASED_OUTPATIENT_CLINIC_OR_DEPARTMENT_OTHER): Admitting: Hematology and Oncology

## 2024-04-06 ENCOUNTER — Inpatient Hospital Stay: Attending: Internal Medicine

## 2024-04-06 VITALS — BP 122/80 | HR 95 | Temp 97.9°F | Resp 16 | Wt 182.5 lb

## 2024-04-06 DIAGNOSIS — C7A8 Other malignant neuroendocrine tumors: Secondary | ICD-10-CM | POA: Insufficient documentation

## 2024-04-06 DIAGNOSIS — Z86718 Personal history of other venous thrombosis and embolism: Secondary | ICD-10-CM | POA: Diagnosis not present

## 2024-04-06 DIAGNOSIS — K869 Disease of pancreas, unspecified: Secondary | ICD-10-CM

## 2024-04-06 DIAGNOSIS — Z7901 Long term (current) use of anticoagulants: Secondary | ICD-10-CM | POA: Insufficient documentation

## 2024-04-06 DIAGNOSIS — K8689 Other specified diseases of pancreas: Secondary | ICD-10-CM

## 2024-04-06 DIAGNOSIS — R519 Headache, unspecified: Secondary | ICD-10-CM | POA: Insufficient documentation

## 2024-04-06 DIAGNOSIS — Z90710 Acquired absence of both cervix and uterus: Secondary | ICD-10-CM | POA: Diagnosis not present

## 2024-04-06 DIAGNOSIS — I82622 Acute embolism and thrombosis of deep veins of left upper extremity: Secondary | ICD-10-CM

## 2024-04-06 DIAGNOSIS — Z87891 Personal history of nicotine dependence: Secondary | ICD-10-CM | POA: Insufficient documentation

## 2024-04-06 LAB — CBC WITH DIFFERENTIAL (CANCER CENTER ONLY)
Abs Immature Granulocytes: 0.02 K/uL (ref 0.00–0.07)
Basophils Absolute: 0.2 K/uL — ABNORMAL HIGH (ref 0.0–0.1)
Basophils Relative: 2 %
Eosinophils Absolute: 0.5 K/uL (ref 0.0–0.5)
Eosinophils Relative: 6 %
HCT: 40.9 % (ref 36.0–46.0)
Hemoglobin: 13.8 g/dL (ref 12.0–15.0)
Immature Granulocytes: 0 %
Lymphocytes Relative: 26 %
Lymphs Abs: 2.1 K/uL (ref 0.7–4.0)
MCH: 30.2 pg (ref 26.0–34.0)
MCHC: 33.7 g/dL (ref 30.0–36.0)
MCV: 89.5 fL (ref 80.0–100.0)
Monocytes Absolute: 0.6 K/uL (ref 0.1–1.0)
Monocytes Relative: 8 %
Neutro Abs: 4.6 K/uL (ref 1.7–7.7)
Neutrophils Relative %: 58 %
Platelet Count: 392 K/uL (ref 150–400)
RBC: 4.57 MIL/uL (ref 3.87–5.11)
RDW: 13.5 % (ref 11.5–15.5)
WBC Count: 8 K/uL (ref 4.0–10.5)
nRBC: 0 % (ref 0.0–0.2)

## 2024-04-06 LAB — CMP (CANCER CENTER ONLY)
ALT: 21 U/L (ref 0–44)
AST: 19 U/L (ref 15–41)
Albumin: 4.4 g/dL (ref 3.5–5.0)
Alkaline Phosphatase: 63 U/L (ref 38–126)
Anion gap: 6 (ref 5–15)
BUN: 13 mg/dL (ref 6–20)
CO2: 29 mmol/L (ref 22–32)
Calcium: 9.5 mg/dL (ref 8.9–10.3)
Chloride: 104 mmol/L (ref 98–111)
Creatinine: 1.19 mg/dL — ABNORMAL HIGH (ref 0.44–1.00)
GFR, Estimated: 58 mL/min — ABNORMAL LOW (ref >60)
Glucose, Bld: 96 mg/dL (ref 70–99)
Potassium: 4.2 mmol/L (ref 3.5–5.1)
Sodium: 139 mmol/L (ref 135–145)
Total Bilirubin: 0.5 mg/dL (ref 0.0–1.2)
Total Protein: 7.4 g/dL (ref 6.5–8.1)
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
## 2024-04-20 ENCOUNTER — Ambulatory Visit

## 2024-04-20 VITALS — BP 111/76 | HR 96 | Temp 97.6°F | Resp 12 | Ht 68.5 in | Wt 181.4 lb

## 2024-04-20 DIAGNOSIS — R768 Other specified abnormal immunological findings in serum: Secondary | ICD-10-CM | POA: Diagnosis not present

## 2024-04-20 DIAGNOSIS — H04123 Dry eye syndrome of bilateral lacrimal glands: Secondary | ICD-10-CM

## 2024-04-20 DIAGNOSIS — M2550 Pain in unspecified joint: Secondary | ICD-10-CM | POA: Diagnosis not present

## 2024-04-20 DIAGNOSIS — M791 Myalgia, unspecified site: Secondary | ICD-10-CM

## 2024-04-20 DIAGNOSIS — M797 Fibromyalgia: Secondary | ICD-10-CM | POA: Diagnosis not present

## 2024-04-20 DIAGNOSIS — G894 Chronic pain syndrome: Secondary | ICD-10-CM

## 2024-04-20 DIAGNOSIS — R682 Dry mouth, unspecified: Secondary | ICD-10-CM

## 2024-04-21 ENCOUNTER — Ambulatory Visit: Payer: Self-pay

## 2024-04-21 LAB — SJOGREN'S SYNDROME ANTIBODS(SSA + SSB)
SSA (Ro) (ENA) Antibody, IgG: <1 AI
SSB (La) (ENA) Antibody, IgG: <1 AI

## 2024-04-26 ENCOUNTER — Encounter: Payer: Self-pay | Admitting: Family Medicine

## 2024-04-26 ENCOUNTER — Ambulatory Visit: Admitting: Family Medicine

## 2024-04-26 VITALS — BP 114/68 | HR 90 | Temp 97.6°F | Ht 68.5 in | Wt 183.0 lb

## 2024-04-26 DIAGNOSIS — F339 Major depressive disorder, recurrent, unspecified: Secondary | ICD-10-CM | POA: Diagnosis not present

## 2024-04-26 DIAGNOSIS — Z9189 Other specified personal risk factors, not elsewhere classified: Secondary | ICD-10-CM | POA: Diagnosis not present

## 2024-04-26 DIAGNOSIS — R400 Somnolence: Secondary | ICD-10-CM | POA: Diagnosis not present

## 2024-04-26 DIAGNOSIS — G894 Chronic pain syndrome: Secondary | ICD-10-CM | POA: Diagnosis not present

## 2024-04-26 DIAGNOSIS — R5383 Other fatigue: Secondary | ICD-10-CM

## 2024-04-26 NOTE — Patient Instructions (Addendum)
 I referred you to a sleep specialist at [facility], for possible sleep apnea or other sleep disorder.   I also recommend follow up with your hematologist and psychiatrist.   You should hear from pain management soon. Let your rheumatologist know if you do not since they referred you.   You may benefit from counseling. I would give it a try.
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
## 2024-05-13 ENCOUNTER — Encounter: Payer: Self-pay | Admitting: Physical Medicine and Rehabilitation

## 2024-05-31 ENCOUNTER — Encounter: Payer: Self-pay | Admitting: Physical Medicine and Rehabilitation

## 2024-07-22 ENCOUNTER — Emergency Department (HOSPITAL_BASED_OUTPATIENT_CLINIC_OR_DEPARTMENT_OTHER)
Admission: EM | Admit: 2024-07-22 | Discharge: 2024-07-22 | Disposition: A | Discharge status: Home / Self Care | Attending: Emergency Medicine | Admitting: Emergency Medicine

## 2024-07-22 ENCOUNTER — Emergency Department (HOSPITAL_BASED_OUTPATIENT_CLINIC_OR_DEPARTMENT_OTHER): Admitting: Radiology

## 2024-07-22 ENCOUNTER — Other Ambulatory Visit: Payer: Self-pay

## 2024-07-22 DIAGNOSIS — Z86718 Personal history of other venous thrombosis and embolism: Secondary | ICD-10-CM | POA: Insufficient documentation

## 2024-07-22 DIAGNOSIS — U071 COVID-19: Secondary | ICD-10-CM | POA: Diagnosis not present

## 2024-07-22 DIAGNOSIS — R519 Headache, unspecified: Secondary | ICD-10-CM

## 2024-07-22 DIAGNOSIS — Z8507 Personal history of malignant neoplasm of pancreas: Secondary | ICD-10-CM | POA: Insufficient documentation

## 2024-07-22 DIAGNOSIS — Z7901 Long term (current) use of anticoagulants: Secondary | ICD-10-CM | POA: Diagnosis not present

## 2024-07-22 DIAGNOSIS — R0602 Shortness of breath: Secondary | ICD-10-CM

## 2024-07-22 DIAGNOSIS — R059 Cough, unspecified: Secondary | ICD-10-CM | POA: Diagnosis present

## 2024-07-22 LAB — PROTIME-INR
INR: 1 (ref 0.8–1.2)
Prothrombin Time: 13.8 s (ref 11.4–15.2)

## 2024-07-22 LAB — COMPREHENSIVE METABOLIC PANEL WITH GFR
ALT: 28 U/L (ref 0–44)
AST: 29 U/L (ref 15–41)
Albumin: 4.2 g/dL (ref 3.5–5.0)
Alkaline Phosphatase: 88 U/L (ref 38–126)
Anion gap: 11 (ref 5–15)
BUN: 11 mg/dL (ref 6–20)
CO2: 24 mmol/L (ref 22–32)
Calcium: 9.6 mg/dL (ref 8.9–10.3)
Chloride: 103 mmol/L (ref 98–111)
Creatinine, Ser: 0.99 mg/dL (ref 0.44–1.00)
GFR, Estimated: >60 mL/min
Glucose, Bld: 110 mg/dL — ABNORMAL HIGH (ref 70–99)
Potassium: 4.2 mmol/L (ref 3.5–5.1)
Sodium: 138 mmol/L (ref 135–145)
Total Bilirubin: 0.5 mg/dL (ref 0.0–1.2)
Total Protein: 7.3 g/dL (ref 6.5–8.1)

## 2024-07-22 LAB — CBC WITH DIFFERENTIAL/PLATELET
Abs Immature Granulocytes: 0.05 K/uL (ref 0.00–0.07)
Basophils Absolute: 0.1 K/uL (ref 0.0–0.1)
Basophils Relative: 1 %
Eosinophils Absolute: 0.1 K/uL (ref 0.0–0.5)
Eosinophils Relative: 1 %
HCT: 41.1 % (ref 36.0–46.0)
Hemoglobin: 14.1 g/dL (ref 12.0–15.0)
Immature Granulocytes: 0 %
Lymphocytes Relative: 12 %
Lymphs Abs: 1.9 K/uL (ref 0.7–4.0)
MCH: 30.4 pg (ref 26.0–34.0)
MCHC: 34.3 g/dL (ref 30.0–36.0)
MCV: 88.6 fL (ref 80.0–100.0)
Monocytes Absolute: 0.6 K/uL (ref 0.1–1.0)
Monocytes Relative: 4 %
Neutro Abs: 13.3 K/uL — ABNORMAL HIGH (ref 1.7–7.7)
Neutrophils Relative %: 82 %
Platelets: 421 K/uL — ABNORMAL HIGH (ref 150–400)
RBC: 4.64 MIL/uL (ref 3.87–5.11)
RDW: 13.2 % (ref 11.5–15.5)
WBC: 16.1 K/uL — ABNORMAL HIGH (ref 4.0–10.5)
nRBC: 0 % (ref 0.0–0.2)

## 2024-07-22 LAB — D-DIMER, QUANTITATIVE: D-Dimer, Quant: 0.46 ug{FEU}/mL (ref 0.00–0.50)

## 2024-07-22 LAB — URINALYSIS, ROUTINE W REFLEX MICROSCOPIC
Bilirubin Urine: NEGATIVE
Glucose, UA: NEGATIVE mg/dL
Hgb urine dipstick: NEGATIVE
Ketones, ur: 40 mg/dL — AB
Leukocytes,Ua: NEGATIVE
Nitrite: NEGATIVE
Specific Gravity, Urine: 1.022 (ref 1.005–1.030)
pH: 8 (ref 5.0–8.0)

## 2024-07-22 LAB — TROPONIN T, HIGH SENSITIVITY: Troponin T High Sensitivity: <15 ng/L (ref 0–19)

## 2024-07-22 LAB — PREGNANCY, URINE: Preg Test, Ur: NEGATIVE

## 2024-07-22 LAB — LIPASE, BLOOD: Lipase: 25 U/L (ref 11–51)

## 2024-07-22 MED ORDER — ONDANSETRON HCL 4 MG/2ML IJ SOLN
4.0000 mg | Freq: Once | INTRAMUSCULAR | Status: AC
  Administered 2024-07-22: 4 mg via INTRAVENOUS
  Filled 2024-07-22: qty 2

## 2024-07-22 MED ORDER — SODIUM CHLORIDE 0.9 % IV BOLUS
1000.0000 mL | Freq: Once | INTRAVENOUS | Status: AC
  Administered 2024-07-22: 1000 mL via INTRAVENOUS

## 2024-07-22 MED ORDER — DIPHENHYDRAMINE HCL 50 MG/ML IJ SOLN
12.5000 mg | Freq: Once | INTRAMUSCULAR | Status: AC
  Administered 2024-07-22: 12.5 mg via INTRAVENOUS
  Filled 2024-07-22: qty 1

## 2024-07-22 MED ORDER — PROCHLORPERAZINE EDISYLATE 10 MG/2ML IJ SOLN
10.0000 mg | Freq: Once | INTRAMUSCULAR | Status: AC
  Administered 2024-07-22: 10 mg via INTRAVENOUS
  Filled 2024-07-22: qty 2

## 2024-07-22 NOTE — ED Notes (Signed)
 Reviewed discharge instructions and home care with pt. Pt verbalized understanding and had no further questions. Pt exited ED without complications.

## 2024-07-22 NOTE — Discharge Instructions (Signed)
 Thank you let us  evaluate you today.  Your electrolytes are within normal limits.  Your cardiac function is normal.  Your dimer which indicates blood clots is negative.  Chest x-ray does not show any fluid nor pneumonia.  Your symptoms are likely secondary to current COVID infection.  Please continue taking Tylenol  at home for body aches, temperature of greater than 100.4 F.  Make sure you drink plenty of water.  Return to Emergency Department you experience chest pain, shortness of breath, intractable vomiting causing be unable to keep fluids down, worsening symptoms

## 2024-07-22 NOTE — ED Provider Notes (Signed)
 " [place] EMERGENCY DEPARTMENT AT [facility] Provider Note   CSN: [id number] Arrival date & time: 07/22/24  9042     Patient presents with: Cough   Alexis Lindsey is a 43 y.o. 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  Endorses that she had cough and congestion that started 1 week ago.  Has had a few episodes of vomiting on 07/20/2024. No diarrhea.  Has not tried any OTC medications for symptoms.  Has been drinking water at home appropriately. No CP.  Was originally evaluated by urgent care who tested her positive for COVID but sent her here for further evaluation due to history of splenectomy, immunosuppression     Cough      Prior to Admission medications  Medication Sig Start Date End Date Taking? Authorizing Provider  acetaminophen  (TYLENOL ) 500 MG tablet Take 1,000 mg by mouth every 6 (six) hours as needed for mild pain or moderate pain.    [provider]  Ascorbic Acid (VITAMIN C) 500 MG CAPS     [provider]  buPROPion  (WELLBUTRIN  XL) 150 MG 24 hr tablet Take 450 mg by mouth daily.    [provider]  calcium carbonate (TUMS - DOSED IN MG ELEMENTAL CALCIUM) 500 MG chewable tablet Chew 1-2 tablets by mouth daily as needed for indigestion or heartburn.    [provider]  Cholecalciferol (VITAMIN D -3) 125 MCG (5000 UT) TABS Take 1 tablet by mouth daily.    [provider]  clonazePAM  (KLONOPIN ) 1 MG tablet Take 0.5-1 mg by mouth 2 (two) times daily as needed for anxiety.    [provider]  FLUoxetine  (PROZAC ) 40 MG capsule Take 40 mg by mouth daily. 11/10/23   [provider]  Magnesium  500 MG CAPS Take 500 mg by mouth daily.    [provider]  rivaroxaban  (XARELTO ) 20 MG TABS tablet Take 1 tablet  (20 mg total) by mouth daily with supper. 02/23/24   Federico Norleen ONEIDA MADISON, MD  VYVANSE  70 MG capsule Take 70 mg by mouth daily. 07/30/16   [provider]  zolpidem  (AMBIEN ) 10 MG tablet Take 10 mg by mouth at bedtime. Patient taking differently: Take 10 mg by mouth as needed.    [provider]    Allergies: Macrobid [nitrofurantoin macrocrystal], Flexeril  [cyclobenzaprine ], Robaxin  [methocarbamol ], and Codeine    Review of Systems  Respiratory:  Positive for cough.     Updated Vital Signs BP 108/69   Pulse 75   Temp 98.4 F (36.9 C) (Oral)   Resp 18   LMP 01/26/2020   SpO2 97%   Physical Exam Vitals and nursing note reviewed.  Constitutional:      General: She is not in acute distress.    Appearance: Normal appearance.  HENT:     Head: Normocephalic and atraumatic.  Eyes:     Conjunctiva/sclera: Conjunctivae normal.  Cardiovascular:     Rate and Rhythm: Normal rate.  Pulmonary:     Effort: Pulmonary effort is normal. No respiratory distress.  Abdominal:     General: Bowel sounds are normal. There is no distension.     Palpations: Abdomen is soft.     Tenderness: There is no abdominal tenderness. There is no guarding or rebound.  Musculoskeletal:     Right lower leg: No edema.     Left lower  leg: No edema.  Skin:    Coloration: Skin is not jaundiced or pale.  Neurological:     Mental Status: She is alert and oriented to person, place, and time. Mental status is at baseline.     (all labs ordered are listed, but only abnormal results are displayed) Labs Reviewed  CBC WITH DIFFERENTIAL/PLATELET - Abnormal; Notable for the following components:      Result Value   WBC 16.1 (*)    Platelets 421 (*)    Neutro Abs 13.3 (*)    All other components within normal limits  COMPREHENSIVE METABOLIC PANEL WITH GFR - Abnormal; Notable for the following components:   Glucose, Bld 110 (*)    All other components within normal limits  URINALYSIS, ROUTINE W REFLEX  MICROSCOPIC - Abnormal; Notable for the following components:   Ketones, ur 40 (*)    Protein, ur TRACE (*)    All other components within normal limits  LIPASE, BLOOD  D-DIMER, QUANTITATIVE  PROTIME-INR  PREGNANCY, URINE  TROPONIN T, HIGH SENSITIVITY    EKG: EKG Interpretation Date/Time:  Friday July 22 2024 10:16:25 EST Ventricular Rate:  90 PR Interval:  117 QRS Duration:  66 QT Interval:  388 QTC Calculation: 475 R Axis:   70  Text Interpretation: Sinus rhythm Borderline short PR interval Baseline wander in lead(s) I III aVL V1 V3 V4 V5 V6 when compared to prior, more wandering baseline No STEMI Confirmed by Ginger Barefoot ([id number]) on 07/22/2024 2:03:10 PM  Radiology: No results found.    Medications Ordered in the ED  sodium chloride  0.9 % bolus 1,000 mL (0 mLs Intravenous Stopped 07/22/24 1712)  ondansetron  (ZOFRAN ) injection 4 mg (4 mg Intravenous Given 07/22/24 1717)  prochlorperazine  (COMPAZINE ) injection 10 mg (10 mg Intravenous Given 07/22/24 1717)  diphenhydrAMINE  (BENADRYL ) injection 12.5 mg (12.5 mg Intravenous Given 07/22/24 1717)                                    Medical Decision Making Amount and/or Complexity of Data Reviewed Labs: ordered. Radiology: ordered.  Risk Prescription drug management.   Patient presents to the ED for concern of cough, congestion, shortness of breath, this involves an extensive number of treatment options, and is a complaint that carries with it a high risk of complications and morbidity.  The differential diagnosis includes COVID, flu, RSV, pneumonia, fluid overload, PE   Co morbidities that complicate the patient evaluation  Splenectomy DVT Chronic AC   Additional history obtained:  Additional history obtained from Nursing   External records from outside source obtained and reviewed including triage RN note   Lab Tests:  I Ordered, and personally interpreted labs.  The pertinent results include:       Imaging Studies ordered:  I ordered imaging studies including CXR  I independently visualized and interpreted imaging which showed no pleural effusion nor pneumonia I agree with the radiologist interpretation   Cardiac Monitoring:  The patient was maintained on a cardiac monitor.  I personally viewed and interpreted the cardiac monitored which showed an underlying rhythm of: NSR with no ischemic changes   Medicines ordered and prescription drug management:  I ordered medication including compazine , zofran , benadryl , ivf  for hydration, HA, nausea  Reevaluation of the patient after these medicines showed that the patient improved I have reviewed the patients home medicines and have made adjustments as needed    Problem List /  ED Course:  COVID Vital signs hemodynamically stable Outside paxlovid window SHOB Does not appear fluid overloaded on exam.  No pedal edema bilaterally.  Chest x-ray without pleural effusion. Does have a history of DVT.  Is properly anticoagulated on Xarelto .  Did obtain dimer which is negative.  Low suspicion for PE as etiology shortness of breath EKG without ischemic changes.  Troponin negative.  Low suspicion of ACS as etiology of shortness of breath Chest x-ray without pneumonia Symptoms of bodyaches, shortness of breath, Cough, congestion likely secondary to COVID infection Nausea No abdominal tenderness. Low suspicion for intraabdominal infection, inflammation hCG negative UA wo infection Provided zofran  with improvement Provided IVF for hydration Passes po challenge HA Started while in ED. Not max intensity at onset. Low suspicion for ICH Significantly improved following migraine cocktail    Reevaluation:  After the interventions noted above, I reevaluated the patient and found that they have :improved     Dispostion:  After consideration of the diagnostic results and the patients response to treatment, I feel that the patent  would benefit from outpatient management symptomatic treatment.   Discussed ED workup, disposition, return to ED precautions with patient who expresses understanding agrees with plan.  All questions answered to their satisfaction.  They are agreeable to plan.  Discharge instructions provided on paperwork  Final diagnoses:  COVID  Shortness of breath  Acute nonintractable headache, unspecified headache type    ED Discharge Orders     None        Minnie Tinnie BRAVO, PA 07/24/24 [id number]    Tegeler, Lonni PARAS, MD 07/30/24 [phone]  "

## 2024-07-22 NOTE — ED Triage Notes (Signed)
 BIB [facility]. From [facility]. Tested positive for Covid today. Reports cough, congestion, and body aches.

## 2024-07-22 NOTE — ED Notes (Signed)
 ED Provider at bedside.

## 2024-08-01 ENCOUNTER — Encounter: Payer: Self-pay | Admitting: Physical Medicine and Rehabilitation

## 2024-08-01 ENCOUNTER — Encounter: Attending: Physical Medicine and Rehabilitation | Admitting: Physical Medicine and Rehabilitation

## 2024-08-01 VITALS — BP 119/83 | HR 91 | Ht 68.5 in | Wt 195.8 lb

## 2024-08-01 DIAGNOSIS — G894 Chronic pain syndrome: Secondary | ICD-10-CM | POA: Insufficient documentation

## 2024-08-01 DIAGNOSIS — Z0283 Encounter for blood-alcohol and blood-drug test: Secondary | ICD-10-CM | POA: Insufficient documentation

## 2024-08-01 DIAGNOSIS — R000 Tachycardia, unspecified: Secondary | ICD-10-CM | POA: Insufficient documentation

## 2024-08-01 DIAGNOSIS — M797 Fibromyalgia: Secondary | ICD-10-CM | POA: Insufficient documentation

## 2024-08-01 DIAGNOSIS — R251 Tremor, unspecified: Secondary | ICD-10-CM | POA: Diagnosis not present

## 2024-08-01 DIAGNOSIS — Z029 Encounter for administrative examinations, unspecified: Secondary | ICD-10-CM | POA: Diagnosis not present

## 2024-08-01 MED ORDER — MEMANTINE HCL 5 MG PO TABS: 5.0000 mg | ORAL_TABLET | Freq: Two times a day (BID) | ORAL | 3 refills | Status: AC
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
## 2024-08-01 NOTE — Addendum Note (Signed)
 Addended by: GEORGINA BARI CROME on: 08/01/2024 03:32 PM   Modules accepted: Orders

## 2024-08-02 ENCOUNTER — Other Ambulatory Visit (HOSPITAL_COMMUNITY): Payer: Self-pay | Admitting: Physical Medicine and Rehabilitation

## 2024-08-02 ENCOUNTER — Other Ambulatory Visit (HOSPITAL_COMMUNITY): Payer: Self-pay

## 2024-08-02 DIAGNOSIS — G90A Postural orthostatic tachycardia syndrome (POTS): Secondary | ICD-10-CM | POA: Insufficient documentation

## 2024-08-04 ENCOUNTER — Ambulatory Visit (INDEPENDENT_AMBULATORY_CARE_PROVIDER_SITE_OTHER)

## 2024-08-04 VITALS — BP 108/74 | HR 81 | Temp 97.8°F | Resp 16 | Ht 68.0 in | Wt 192.4 lb

## 2024-08-04 DIAGNOSIS — G90A Postural orthostatic tachycardia syndrome (POTS): Secondary | ICD-10-CM | POA: Diagnosis not present

## 2024-08-04 LAB — DRUG TOX MONITOR 1 W/CONF, ORAL FLD
" Methamphetamine": NEGATIVE ng/mL
AMINOCLONAZEPAM: 1.83 ng/mL — ABNORMAL HIGH
Alprazolam: NEGATIVE ng/mL
Amphetamine: >500 ng/mL — ABNORMAL HIGH
Amphetamines: POSITIVE ng/mL — AB
Barbiturates: NEGATIVE ng/mL
Benzodiazepines: POSITIVE ng/mL — AB
Buprenorphine: NEGATIVE ng/mL
Chlordiazepoxide: NEGATIVE ng/mL
Clonazepam: NEGATIVE ng/mL
Cocaine: NEGATIVE ng/mL
Diazepam: NEGATIVE ng/mL
Fentanyl: NEGATIVE ng/mL
Flunitrazepam: NEGATIVE ng/mL
Flurazepam: NEGATIVE ng/mL
Heroin Metabolite: NEGATIVE ng/mL
Lorazepam: NEGATIVE ng/mL
MARIJUANA: NEGATIVE ng/mL
MDMA: NEGATIVE ng/mL
Meprobamate: NEGATIVE ng/mL
Methadone: NEGATIVE ng/mL
Midazolam: NEGATIVE ng/mL
Nicotine Metabolite: NEGATIVE ng/mL
Nordiazepam: NEGATIVE ng/mL
Opiates: NEGATIVE ng/mL
Oxazepam: NEGATIVE ng/mL
Phencyclidine: NEGATIVE ng/mL
Tapentadol: NEGATIVE ng/mL
Temazepam: NEGATIVE ng/mL
Tramadol: NEGATIVE ng/mL
Triazolam: NEGATIVE ng/mL
Zolpidem: NEGATIVE ng/mL

## 2024-08-04 LAB — DRUG TOX ALC METAB W/CON, ORAL FLD: Alcohol Metabolite: NEGATIVE ng/mL

## 2024-08-04 MED ORDER — SODIUM CHLORIDE 0.9 % IV BOLUS
250.0000 mL | Freq: Once | INTRAVENOUS | Status: AC
  Administered 2024-08-04: 250 mL via INTRAVENOUS
  Filled 2024-08-04: qty 250

## 2024-08-04 NOTE — Progress Notes (Signed)
 Diagnosis: POTS  Provider:  Praveen Mannam MD  Procedure: IV Infusion  IV Type: Peripheral, IV Location: R Antecubital   Normal Saline, Dose: 250 mg  Infusion Start Time: 1203  Infusion Stop Time: 1313  Post Infusion IV Care: Peripheral IV Discontinued  Discharge: Condition: Good, Destination: Home . AVS Declined  Performed by:  Leita FORBES Miles, LPN

## 2024-08-08 ENCOUNTER — Ambulatory Visit

## 2024-08-08 VITALS — BP 103/68 | HR 83 | Temp 98.5°F | Resp 20 | Ht 68.0 in | Wt 196.2 lb

## 2024-08-08 DIAGNOSIS — G90A Postural orthostatic tachycardia syndrome (POTS): Secondary | ICD-10-CM

## 2024-08-08 MED ORDER — SODIUM CHLORIDE 0.9 % IV BOLUS
250.0000 mL | Freq: Once | INTRAVENOUS | Status: AC
  Administered 2024-08-08: 250 mL via INTRAVENOUS
  Filled 2024-08-08: qty 250

## 2024-08-08 NOTE — Progress Notes (Signed)
 Diagnosis: POTS  Provider:  Mannam, Praveen MD  Procedure: IV Infusion  IV Type: Peripheral, IV Location: L Forearm  Normal Saline, Dose: 250 mL  Infusion Start Time: 1049  Infusion Stop Time: 1155  Post Infusion IV Care: Peripheral IV Discontinued  Discharge: Condition: Stable, Destination: Home . AVS Declined  Performed by:  Rocky FORBES Search, RN

## 2024-08-09 ENCOUNTER — Encounter: Payer: Self-pay | Admitting: Physical Medicine and Rehabilitation

## 2024-08-10 ENCOUNTER — Telehealth: Payer: Self-pay | Admitting: *Deleted

## 2024-08-10 ENCOUNTER — Ambulatory Visit (INDEPENDENT_AMBULATORY_CARE_PROVIDER_SITE_OTHER)

## 2024-08-10 VITALS — BP 113/74 | HR 87 | Temp 98.2°F | Resp 16 | Ht 68.0 in | Wt 198.4 lb

## 2024-08-10 DIAGNOSIS — G90A Postural orthostatic tachycardia syndrome (POTS): Secondary | ICD-10-CM

## 2024-08-10 MED ORDER — SODIUM CHLORIDE 0.9 % IV BOLUS
250.0000 mL | Freq: Once | INTRAVENOUS | Status: AC
  Administered 2024-08-10: 250 mL via INTRAVENOUS
  Filled 2024-08-10: qty 250

## 2024-08-10 NOTE — Telephone Encounter (Signed)
 Alexis Lindsey called and reports that she is unable to take the memantine . Too many side effects including increase in her depression.

## 2024-08-10 NOTE — Progress Notes (Signed)
 Diagnosis: POTS  Provider:  Mannam, Praveen MD  Procedure: IV Infusion  IV Type: Peripheral, IV Location: R Antecubital  Normal Saline, Dose: 250 mL  Infusion Start Time: 1054  Infusion Stop Time: 1157  Post Infusion IV Care: Peripheral IV Discontinued  Discharge: Condition: Stable, Destination: Home . AVS Declined  Performed by:  Rocky FORBES Search, RN

## 2024-08-11 ENCOUNTER — Encounter: Admitting: Physical Medicine and Rehabilitation

## 2024-08-11 DIAGNOSIS — M797 Fibromyalgia: Secondary | ICD-10-CM | POA: Diagnosis not present

## 2024-08-11 MED ORDER — ACETAMINOPHEN-CODEINE 300-30 MG PO TABS: 1.0000 | ORAL_TABLET | Freq: Two times a day (BID) | ORAL | 0 refills | Status: AC | PRN
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
## 2024-08-12 ENCOUNTER — Encounter: Payer: Self-pay | Admitting: Hematology and Oncology

## 2024-08-14 ENCOUNTER — Emergency Department (HOSPITAL_COMMUNITY)
Admission: EM | Admit: 2024-08-14 | Discharge: 2024-08-15 | Disposition: A | Attending: Emergency Medicine | Admitting: Emergency Medicine

## 2024-08-14 ENCOUNTER — Other Ambulatory Visit: Payer: Self-pay

## 2024-08-14 ENCOUNTER — Encounter (HOSPITAL_COMMUNITY): Payer: Self-pay

## 2024-08-14 DIAGNOSIS — E871 Hypo-osmolality and hyponatremia: Secondary | ICD-10-CM | POA: Insufficient documentation

## 2024-08-14 DIAGNOSIS — Z8507 Personal history of malignant neoplasm of pancreas: Secondary | ICD-10-CM | POA: Diagnosis not present

## 2024-08-14 DIAGNOSIS — R748 Abnormal levels of other serum enzymes: Secondary | ICD-10-CM | POA: Diagnosis not present

## 2024-08-14 DIAGNOSIS — D75839 Thrombocytosis, unspecified: Secondary | ICD-10-CM | POA: Diagnosis not present

## 2024-08-14 DIAGNOSIS — R7309 Other abnormal glucose: Secondary | ICD-10-CM | POA: Insufficient documentation

## 2024-08-14 DIAGNOSIS — Z7901 Long term (current) use of anticoagulants: Secondary | ICD-10-CM | POA: Insufficient documentation

## 2024-08-14 DIAGNOSIS — R519 Headache, unspecified: Secondary | ICD-10-CM | POA: Diagnosis present

## 2024-08-14 DIAGNOSIS — G43909 Migraine, unspecified, not intractable, without status migrainosus: Secondary | ICD-10-CM | POA: Diagnosis not present

## 2024-08-14 DIAGNOSIS — R739 Hyperglycemia, unspecified: Secondary | ICD-10-CM

## 2024-08-14 MED ORDER — PROCHLORPERAZINE EDISYLATE 10 MG/2ML IJ SOLN
10.0000 mg | Freq: Once | INTRAMUSCULAR | Status: AC
  Administered 2024-08-15: 10 mg via INTRAVENOUS
  Filled 2024-08-14: qty 2

## 2024-08-14 MED ORDER — DIPHENHYDRAMINE HCL 50 MG/ML IJ SOLN
25.0000 mg | Freq: Once | INTRAMUSCULAR | Status: AC
  Administered 2024-08-14: 25 mg via INTRAVENOUS
  Filled 2024-08-14: qty 1

## 2024-08-14 MED ORDER — SODIUM CHLORIDE 0.9 % IV BOLUS
1000.0000 mL | Freq: Once | INTRAVENOUS | Status: AC
  Administered 2024-08-14: 1000 mL via INTRAVENOUS

## 2024-08-14 NOTE — ED Provider Notes (Signed)
 " [facility] Provider Note   CSN: [id number] Arrival date & time: 08/14/24  2313     Patient presents with: Migraine   Alexis Lindsey is a 44 y.o. female.  {Add pertinent medical, surgical, social history, OB history to YEP:67052} The history is provided by the patient.  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  There is associated blurring of vision, nausea, vomiting.  She denies photophobia and phonophobia.  She states this feels different from her migraines.    Prior to Admission medications  Medication Sig Start Date End Date Taking? Authorizing Provider  acetaminophen  (TYLENOL ) 500 MG tablet Take 1,000 mg by mouth every 6 (six) hours as needed for mild pain or moderate pain.    [provider]  acetaminophen -codeine  (TYLENOL  #3) 300-30 MG tablet Take 1 tablet by mouth 2 (two) times daily as needed for moderate pain (pain score 4-6). 08/11/24   Raulkar, Krutika P, MD  Ascorbic Acid (VITAMIN C) 500 MG CAPS     [provider]  buPROPion  (WELLBUTRIN  XL) 150 MG 24 hr tablet Take 450 mg by mouth daily.    [provider]  calcium carbonate (TUMS - DOSED IN MG ELEMENTAL CALCIUM) 500 MG chewable tablet Chew 1-2 tablets by mouth daily as needed for indigestion or heartburn.    [provider]  Cholecalciferol (VITAMIN D -3) 125 MCG (5000 UT) TABS Take 1 tablet by mouth daily.    [provider]  clonazePAM  (KLONOPIN ) 1 MG tablet Take 0.5-1 mg by mouth 2 (two) times daily as needed for anxiety.    [provider]  FLUoxetine  (PROZAC ) 40 MG capsule Take 40 mg by mouth daily. 11/10/23   [provider]  Magnesium  500 MG CAPS Take 500 mg by mouth daily.    [provider]  memantine  (NAMENDA ) 5 MG tablet Take 1 tablet (5 mg total) by mouth 2  (two) times daily. 08/01/24   Raulkar, Sven SQUIBB, MD  rivaroxaban  (XARELTO ) 20 MG TABS tablet Take 1 tablet (20 mg total) by mouth daily with supper. 02/23/24   Federico Norleen ONEIDA MADISON, MD  VYVANSE  70 MG capsule Take 70 mg by mouth daily. 07/30/16   [provider]  zolpidem  (AMBIEN ) 10 MG tablet Take 10 mg by mouth at bedtime. Patient taking differently: Take 10 mg by mouth as needed.    [provider]    Allergies: Macrobid [nitrofurantoin macrocrystal], Flexeril  [cyclobenzaprine ], Robaxin  [methocarbamol ], and Codeine     Review of Systems  All other systems reviewed and are negative.   Updated Vital Signs BP 116/71 (BP Location: Left Arm)   Pulse (!) 114   Temp 98.2 F (36.8 C)   Resp 20   Ht 5' 8 (1.727 m)   Wt 89.8 kg   LMP 01/26/2020   SpO2 98%   BMI 30.11 kg/m   Physical Exam Vitals and nursing note reviewed.   44 year old female, in obvious pain, but is in no acute distress. Vital signs are significant for elevated heart rate. Oxygen saturation is 98%, which is normal. Head is normocephalic and atraumatic. PERRLA, EOMI.  Neck is nontender and supple. Lungs are clear without rales, wheezes, or rhonchi. Heart has regular rate and rhythm without murmur. Abdomen is soft, flat, nontender. Extremities have no cyanosis or edema. Skin is warm and dry without rash.  Neurologic: Awake and alert, cranial nerves are intact.  Strength is 5/5 in all 4 extremities.  (all labs ordered are listed, but only abnormal results are displayed) Labs Reviewed - No data to display  EKG: None  Radiology: No results found.  {Document cardiac monitor, telemetry assessment procedure when appropriate:32947} Procedures   Medications Ordered in the ED - No data to display    {Click here for ABCD2, HEART and other calculators REFRESH Note before signing:1}                              Medical Decision Making  Severe headache in the setting of anticoagulation with findings  somewhat atypical for migraines concern for subarachnoid hemorrhage.  I have ordered a headache cocktail of prochlorperazine  and normal saline solution and diphenhydramine  and I have ordered CT angiogram of the head.  I have reviewed her past records, and last ED visit for a migraine headache was on 05/31/2012.  I can see no prior imaging of the head-either MRI or CT.  {Document critical care time when appropriate  Document review of labs and clinical decision tools ie CHADS2VASC2, etc  Document your independent review of radiology images and any outside records  Document your discussion with family members, caretakers and with consultants  Document social determinants of health affecting pt's care  Document your decision making why or why not admission, treatments were needed:32947:::1}   Final diagnoses:  None    ED Discharge Orders     None        "

## 2024-08-14 NOTE — ED Triage Notes (Signed)
 Complaining of a severe headache behind the right eye. Hx of migraines said this feels like one. Started this morning but has gotten worse since 6 pm. Took tylenol  2 hours ago.

## 2024-08-15 ENCOUNTER — Telehealth: Payer: Self-pay

## 2024-08-15 ENCOUNTER — Emergency Department (HOSPITAL_COMMUNITY)

## 2024-08-15 LAB — CBC WITH DIFFERENTIAL/PLATELET
Abs Immature Granulocytes: 0.03 K/uL (ref 0.00–0.07)
Basophils Absolute: 0.1 K/uL (ref 0.0–0.1)
Basophils Relative: 1 %
Eosinophils Absolute: 0.6 K/uL — ABNORMAL HIGH (ref 0.0–0.5)
Eosinophils Relative: 5 %
HCT: 38.1 % (ref 36.0–46.0)
Hemoglobin: 12.5 g/dL (ref 12.0–15.0)
Immature Granulocytes: 0 %
Lymphocytes Relative: 25 %
Lymphs Abs: 3 K/uL (ref 0.7–4.0)
MCH: 29.3 pg (ref 26.0–34.0)
MCHC: 32.8 g/dL (ref 30.0–36.0)
MCV: 89.4 fL (ref 80.0–100.0)
Monocytes Absolute: 1.2 K/uL — ABNORMAL HIGH (ref 0.1–1.0)
Monocytes Relative: 10 %
Neutro Abs: 6.9 K/uL (ref 1.7–7.7)
Neutrophils Relative %: 59 %
Platelets: 458 K/uL — ABNORMAL HIGH (ref 150–400)
RBC: 4.26 MIL/uL (ref 3.87–5.11)
RDW: 13.7 % (ref 11.5–15.5)
WBC: 11.9 K/uL — ABNORMAL HIGH (ref 4.0–10.5)
nRBC: 0 % (ref 0.0–0.2)

## 2024-08-15 LAB — BASIC METABOLIC PANEL WITH GFR
Anion gap: 9 (ref 5–15)
BUN: 19 mg/dL (ref 6–20)
CO2: 27 mmol/L (ref 22–32)
Calcium: 8.7 mg/dL — ABNORMAL LOW (ref 8.9–10.3)
Chloride: 98 mmol/L (ref 98–111)
Creatinine, Ser: 1.12 mg/dL — ABNORMAL HIGH (ref 0.44–1.00)
GFR, Estimated: >60 mL/min
Glucose, Bld: 102 mg/dL — ABNORMAL HIGH (ref 70–99)
Potassium: 4.4 mmol/L (ref 3.5–5.1)
Sodium: 134 mmol/L — ABNORMAL LOW (ref 135–145)

## 2024-08-15 LAB — I-STAT CREATININE, ED: Creatinine, Ser: 1.2 mg/dL — ABNORMAL HIGH (ref 0.44–1.00)

## 2024-08-15 MED ORDER — DIHYDROERGOTAMINE MESYLATE 1 MG/ML IJ SOLN
1.0000 mg | Freq: Once | INTRAMUSCULAR | Status: AC
  Administered 2024-08-15: 1 mg via INTRAVENOUS
  Filled 2024-08-15: qty 1

## 2024-08-15 MED ORDER — METOCLOPRAMIDE HCL 5 MG/ML IJ SOLN
10.0000 mg | Freq: Once | INTRAMUSCULAR | Status: AC
  Administered 2024-08-15: 10 mg via INTRAVENOUS
  Filled 2024-08-15: qty 2

## 2024-08-15 MED ORDER — DEXAMETHASONE SOD PHOSPHATE PF 10 MG/ML IJ SOLN
10.0000 mg | Freq: Once | INTRAMUSCULAR | Status: AC
  Administered 2024-08-15: 10 mg via INTRAVENOUS
  Filled 2024-08-15: qty 1

## 2024-08-15 MED ORDER — IOHEXOL 350 MG/ML SOLN
75.0000 mL | Freq: Once | INTRAVENOUS | Status: AC | PRN
  Administered 2024-08-15: 75 mL via INTRAVENOUS

## 2024-08-15 NOTE — ED Notes (Signed)
 Unsuccessful attempt to place 20g IV by this RN x2, second RN able to place 22g IV, per CT tech pt requires 20g IV in forearm or higher, IV team consult placed.

## 2024-08-15 NOTE — Discharge Instructions (Addendum)
 Your evaluation did not show any sign of bleeding in the brain.  This headache appears to have been a migraine.  Continue usual migraine treatment, return to the emergency department if you have any concerning symptoms.

## 2024-08-15 NOTE — Transitions of Care (Post Inpatient/ED Visit) (Signed)
" ° °  08/15/2024  Name: SYDNEY HASTEN MRN: [id number] DOB: 08-09-1980  Today's TOC FU Call Status: Today's TOC FU Call Status:: Unsuccessful Call (1st Attempt) Unsuccessful Call (1st Attempt) Date: 08/15/24  Attempted to reach the patient regarding the most recent Inpatient/ED visit.  Follow Up Plan: Additional outreach attempts will be made to reach the patient to complete the Transitions of Care (Post Inpatient/ED visit) call.   [facility] "

## 2024-08-17 ENCOUNTER — Ambulatory Visit

## 2024-08-17 MED ORDER — SODIUM CHLORIDE 0.9 % IV BOLUS
250.0000 mL | Freq: Once | INTRAVENOUS | Status: AC
  Filled 2024-08-17: qty 250

## 2024-08-19 ENCOUNTER — Ambulatory Visit

## 2024-08-19 ENCOUNTER — Other Ambulatory Visit: Payer: Self-pay | Admitting: Hematology and Oncology

## 2024-08-19 MED ORDER — SODIUM CHLORIDE 0.9 % IV BOLUS
250.0000 mL | Freq: Once | INTRAVENOUS | Status: AC
  Filled 2024-08-19: qty 250

## 2024-08-22 ENCOUNTER — Encounter: Payer: Self-pay | Admitting: Physical Medicine and Rehabilitation

## 2024-08-24 ENCOUNTER — Encounter: Payer: Self-pay | Admitting: Hematology and Oncology

## 2024-08-24 ENCOUNTER — Encounter: Payer: Self-pay | Admitting: Physical Medicine and Rehabilitation

## 2024-09-05 ENCOUNTER — Ambulatory Visit: Payer: Self-pay | Admitting: Neurology

## 2024-09-20 ENCOUNTER — Ambulatory Visit (HOSPITAL_BASED_OUTPATIENT_CLINIC_OR_DEPARTMENT_OTHER): Admitting: Physical Therapy

## 2024-09-23 ENCOUNTER — Ambulatory Visit: Admitting: Physician Assistant

## 2024-11-01 ENCOUNTER — Encounter: Admitting: Physical Medicine and Rehabilitation

## 2024-12-28 ENCOUNTER — Other Ambulatory Visit

## 2024-12-28 ENCOUNTER — Ambulatory Visit: Admitting: Hematology and Oncology
